# Patient Record
Sex: Female | Born: 1980 | Race: White | Hispanic: No | Smoking: Former smoker
Health system: Southern US, Community
[De-identification: ages and names within clinical notes are randomized; demographics above are authoritative.]

## PROBLEM LIST (undated history)

## (undated) DIAGNOSIS — H9190 Unspecified hearing loss, unspecified ear: Secondary | ICD-10-CM

## (undated) DIAGNOSIS — G40909 Epilepsy, unspecified, not intractable, without status epilepticus: Secondary | ICD-10-CM

## (undated) DIAGNOSIS — T8859XA Other complications of anesthesia, initial encounter: Secondary | ICD-10-CM

## (undated) DIAGNOSIS — K219 Gastro-esophageal reflux disease without esophagitis: Secondary | ICD-10-CM

## (undated) DIAGNOSIS — R569 Unspecified convulsions: Secondary | ICD-10-CM

## (undated) DIAGNOSIS — Z9889 Other specified postprocedural states: Secondary | ICD-10-CM

## (undated) HISTORY — PX: COCHLEAR IMPLANT: SUR684

## (undated) HISTORY — PX: TONSILECTOMY, ADENOIDECTOMY, BILATERAL MYRINGOTOMY AND TUBES: SHX2538

## (undated) HISTORY — DX: Unspecified hearing loss, unspecified ear: H91.90

## (undated) HISTORY — PX: CHOLECYSTECTOMY: SHX55

## (undated) HISTORY — DX: Unspecified convulsions: R56.9

---

## 2009-08-05 ENCOUNTER — Ambulatory Visit (HOSPITAL_COMMUNITY): Admission: RE | Admit: 2009-08-05 | Discharge: 2009-08-05 | Payer: Self-pay | Admitting: Obstetrics & Gynecology

## 2009-09-27 HISTORY — PX: ABDOMINAL HYSTERECTOMY: SHX81

## 2010-04-03 ENCOUNTER — Ambulatory Visit (HOSPITAL_COMMUNITY): Admission: RE | Admit: 2010-04-03 | Discharge: 2010-04-03 | Payer: Self-pay | Admitting: Obstetrics & Gynecology

## 2010-12-13 LAB — CBC
HCT: 45.7 % (ref 36.0–46.0)
Hemoglobin: 15.6 g/dL — ABNORMAL HIGH (ref 12.0–15.0)
MCH: 29.7 pg (ref 26.0–34.0)
MCHC: 34.1 g/dL (ref 30.0–36.0)

## 2013-06-18 ENCOUNTER — Telehealth: Payer: Self-pay | Admitting: Diagnostic Neuroimaging

## 2013-06-19 ENCOUNTER — Telehealth: Payer: Self-pay | Admitting: *Deleted

## 2013-06-19 NOTE — Telephone Encounter (Signed)
After consulting Dr. Marjory Lies, he stated that even with low keppra level, if pt not having seizures or side effects would not change anything on her medical regimen.  Keppra level is not one that we usually draw a level on.  It is one of the medications for seizures that have low drug interactions and side effects.  Father relayed this information.   Will contact daughter and let her know.  If headaches persist, she can be referred here to [place] by her pcp.

## 2013-06-19 NOTE — Telephone Encounter (Signed)
Father calling re: getting response from [place] about her low level of keppra, done by her pcp, [facility].  Looking for lab results, is in Dr. Richrd Humbles office.  Pt has had no seizures states her dad.   She is having headaches and that is why she was seeing her pcp.  Do we want to do anything different for pt?  Please advise.   Pt is deaf, and mobile text only.  I would relay to father.

## 2013-06-20 NOTE — Telephone Encounter (Signed)
Will forward to Dr. Marjory Lies for review.

## 2013-06-21 NOTE — Telephone Encounter (Signed)
Continue current medications. No change recommended. Discussed with Alverda Skeans.  -VRP

## 2013-07-31 ENCOUNTER — Encounter (INDEPENDENT_AMBULATORY_CARE_PROVIDER_SITE_OTHER): Payer: Self-pay

## 2013-07-31 ENCOUNTER — Ambulatory Visit (INDEPENDENT_AMBULATORY_CARE_PROVIDER_SITE_OTHER): Payer: Medicaid Other | Admitting: Diagnostic Neuroimaging

## 2013-07-31 ENCOUNTER — Encounter: Payer: Self-pay | Admitting: Diagnostic Neuroimaging

## 2013-07-31 VITALS — BP 126/87 | HR 86 | Ht 67.0 in | Wt 302.0 lb

## 2013-07-31 DIAGNOSIS — G40909 Epilepsy, unspecified, not intractable, without status epilepticus: Secondary | ICD-10-CM

## 2013-07-31 MED ORDER — LEVETIRACETAM 1000 MG PO TABS: 1000.0000 mg | ORAL_TABLET | Freq: Two times a day (BID) | ORAL | Status: DC

## 2013-07-31 NOTE — Progress Notes (Signed)
[facility]  PATIENT: Mercedes Parks DOB: [birth year]  REFERRING CLINICIAN:  HISTORY FROM: patient and sign interpreter; son (16yrs old) REASON FOR VISIT: follow up   HISTORICAL  CHIEF COMPLAINT:  Chief Complaint  Patient presents with  . Follow-up    Seizure    HISTORY OF PRESENT ILLNESS:   UPDATE 07/31/13: On 07/27/13 patient was at a Halloween party, had several alcoholic drinks, was exposed to secondhand marijuana smoke, and had multiple back-to-back seizures. Apparently she had several minutes of convulsions followed by several minutes of remission. Seizures started and stopped multiple times. Ambulance was called and arrived to pick her up within 45 minutes of onset of symptoms. The symptoms continued while patient was at the hospital. She was given benzodiazepine in the anal his. She was given 1000 mg Keppra IV at the hospital. She was continued on the same dose of antiseizure medication (levetiracetam 500 mg twice a day).  Patient tells that she has been missing some doses of antiseizure medication. She misses 4-8 tablets per month. This usually happens when she falls asleep after school and misses her dose.  UPDATE 11/20/12: She is doing very well, there was no recurrent seizure, she is tolerating Keppra 500 mg twice a day.  UPDATE 02/02/12: No sz. More balance troubles, weight gain, decr energy, and fatigue. Drinks lots of "energy drinks". Having trouble with stairs (trips and falls).   UPDATE 07/06/11: Doing well.  No sz.  Back to driving.  Now with endometriosis and planning to start lupron shots, but concerned about warning on package about epilepsy.  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  He is accompanied by her mother, father and sign language interpreter.  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  Age 76 yrs - cochlear  implants  Oct 2011 - dx'd with endometriosis; treated with tramadol; then had an episode of lightheadedness, out of body sensation, then passed out; reportedly had convulsions and tongue biting, no incontinence; then slept for 2 hours.  Feb 2012 - exposure to synthetic marijuana, then had similar episode of seizure  March 2012 - several episodes of night time awakening, "weird breathing", not knowing if she is awake or dreaming; EEG done at Dr. 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  April 2012 - started on LEV 500mg  BID  REVIEW OF SYSTEMS: Full 14 system review of systems performed and notable only for confusion headache dizziness seizure passing out. Depression decreased energy hallucinations.  ALLERGIES: Allergies  Allergen Reactions  . Amoxicillin   . Penicillins   . Sulfa Antibiotics     HOME MEDICATIONS: Outpatient Prescriptions Prior to Visit  Medication Sig Dispense Refill  . citalopram (CELEXA) 20 MG tablet Take 20 mg by mouth daily.      . clonazePAM (KLONOPIN) 0.5 MG tablet Take 0.5 mg by mouth daily.      Marland Kitchen letrozole (FEMARA) 2.5 MG tablet Take 2.5 mg by mouth daily.      Marland Kitchen levETIRAcetam (KEPPRA) 500 MG tablet Take 500 mg by mouth 2 (two) times daily.      . Norethindrone (CAMILA PO) Take 1 tablet by mouth daily.       No facility-administered medications prior to visit.    PAST MEDICAL HISTORY: Past Medical History  Diagnosis Date  . 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 Seizure  PAST SURGICAL HISTORY: Past Surgical History  Procedure Laterality Date  . Cochlear implant      @ age 14  . Cholecystectomy    . Cesarean section      @ age 39    FAMILY HISTORY: History reviewed. No pertinent family history.  SOCIAL HISTORY:  History   Social History  . Marital Status: Single    Spouse Name: N/A    Number of Children: 1  . Years of Education: College   Occupational History  .      n/a-unemployed    Social History Main Topics  . Smoking status: Former Smoker    Types: Cigarettes    Quit date: 09/27/2012  . Smokeless tobacco: Never Used  . Alcohol Use: No     Comment: quit 11/2010  . Drug Use: No     Comment: marijuana; quit 3/ 2012  . Sexual Activity: Not on file   Other Topics Concern  . Not on file   Social History Narrative   Patient lives at home with her son.   Caffeine Use     PHYSICAL EXAM  Filed Vitals:   07/31/13 1401  BP: 126/87  Pulse: 86  Height: 5\' 7"  (1.702 m)  Weight: 302 lb (136.986 kg)    Not recorded    Body mass index is 47.29 kg/(m^2).  GENERAL EXAM: Patient is in no distress  CARDIOVASCULAR: Regular rate and rhythm, no murmurs, no carotid bruits  NEUROLOGIC: MENTAL STATUS: awake, alert, language fluent, comprehension intact, naming intact CRANIAL NERVE: pupils equal and reactive to light, visual fields full to confrontation, extraocular muscles intact, no nystagmus, facial sensation and strength symmetric, uvula midline, shoulder shrug symmetric, tongue midline. DEAF. USES SIGN LANGUAGE. MOTOR: normal bulk and tone, full strength in the BUE, BLE SENSORY: normal and symmetric to light touch COORDINATION: finger-nose-finger, fine finger movements normal REFLEXES: deep tendon reflexes present and symmetric GAIT/STATION: narrow based gait   DIAGNOSTIC DATA (LABS, IMAGING, TESTING) - I reviewed patient records, labs, notes, testing and imaging myself where available.  Lab Results  Component Value Date   WBC 6.2 03/27/2010   HGB 15.6* 03/27/2010   HCT 45.7 03/27/2010   MCV 87.1 03/27/2010   PLT 229 03/27/2010   No results found for this basename: na, k, cl, co2, glucose, bun, creatinine, calcium, prot, albumin, ast, alt, alkphos, bilitot, gfrnonaa, gfraa   No results found for this basename: CHOL, HDL, LDLCALC, LDLDIRECT, TRIG, CHOLHDL   No results found for this basename: HGBA1C   No results found for this basename: VITAMINB12   No  results found for this basename: TSH      ASSESSMENT AND PLAN  32 y.o.  female with multiple episodes of loss of consciousness, convulsions, tongue biting, also with out of body experience and aura prior to the spells.  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  PLAN: 1. Increase LEV to 1000mg  BID 2. seizure precautions reviewed 3. no driving until sz free x 6 months (last sz 07/27/13)  Return in about 6 months (around 01/28/2014) for with Heide Guile or Penumalli.    Suanne Marker, MD 07/31/2013, 2:58 PM Certified in Neurology, Neurophysiology and Neuroimaging  [facility] [address] [place], [place] [address] [phone]

## 2013-07-31 NOTE — Patient Instructions (Addendum)
Increase levetiracetam to 1000mg  twice a day.  No driving until seizure free x 6 months.

## 2013-10-08 ENCOUNTER — Encounter: Payer: Self-pay | Admitting: Nurse Practitioner

## 2014-01-28 ENCOUNTER — Ambulatory Visit: Payer: Medicaid Other | Admitting: Nurse Practitioner

## 2014-01-30 ENCOUNTER — Encounter: Payer: Self-pay | Admitting: Nurse Practitioner

## 2014-01-30 ENCOUNTER — Ambulatory Visit (INDEPENDENT_AMBULATORY_CARE_PROVIDER_SITE_OTHER): Payer: Medicaid Other | Admitting: Nurse Practitioner

## 2014-01-30 VITALS — BP 110/76 | HR 67 | Ht 67.0 in | Wt 276.0 lb

## 2014-01-30 DIAGNOSIS — G40909 Epilepsy, unspecified, not intractable, without status epilepticus: Secondary | ICD-10-CM | POA: Insufficient documentation

## 2014-01-30 NOTE — Progress Notes (Signed)
PATIENT: Mercedes Parks DOB: 07-05-1981  REASON FOR VISIT: routine follow up for seizure disorder HISTORY FROM: patient  HISTORY OF PRESENT ILLNESS: UPDATE 01/30/14 (LL):  Mercedes Parks comes in for routine follow up, no seizures since last visit.  She had total hysterectomy in December due to endometriosis.  Tolerating LVT 1000 mg BID with only occasional dizziness.  She reports trouble staying asleep, wakes usually 2-3 times. Doing very well.  Asks about "vaping" and if it may trigger seizure.  UPDATE 07/31/13: On 07/27/13 patient was at a [place] party, had several alcoholic drinks, was exposed to secondhand marijuana smoke, and had multiple back-to-back seizures. Apparently she had several minutes of convulsions followed by several minutes of remission. Seizures started and stopped multiple times. Ambulance was called and arrived to pick her up within 45 minutes of onset of symptoms. The symptoms continued while patient was at the hospital. She was given benzodiazepine in the ambulance. She was given 1000 mg Keppra IV at the hospital. She was continued on the same dose of antiseizure medication (levetiracetam 500 mg twice a day).  Patient tells that she has been missing some doses of antiseizure medication. She misses 4-8 tablets per month. This usually happens when she falls asleep after school and misses her dose.  UPDATE 11/20/12: She is doing very well, there was no recurrent seizure, she is tolerating Keppra 500 mg twice a day.  UPDATE 02/02/12: No sz. More balance troubles, weight gain, decr energy, and fatigue. Drinks lots of "energy drinks". Having trouble with stairs (trips and falls).  UPDATE 07/06/11: Doing well. No sz. Back to driving. Now with endometriosis and planning to start lupron shots, but concerned about warning on package about epilepsy.  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 He is  accompanied by her mother, father and sign language interpreter.  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  Age 23 yrs - cochlear implants  Oct 2011 - dx'd with endometriosis; treated with tramadol; then had an episode of lightheadedness, out of body sensation, then passed out; reportedly had convulsions and tongue biting, no incontinence; then slept for 2 hours.  Feb 2012 - exposure to synthetic marijuana, then had similar episode of seizure  March 2012 - several episodes of night time awakening, "weird breathing", not knowing if she is awake or dreaming; EEG done at Dr. 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  April 2012 - started on LEV 500mg  BID   REVIEW OF SYSTEMS: Full 14 system review of systems performed and notable only for  Dizziness, light sensitivity, insomnia.  ALLERGIES: Allergies  Allergen Reactions  . Amoxicillin   . Penicillins   . Sulfa Antibiotics     HOME MEDICATIONS: Outpatient Prescriptions Prior to Visit  Medication Sig Dispense Refill  . levETIRAcetam (KEPPRA) 1000 MG tablet Take 1 tablet (1,000 mg total) by mouth 2 (two) times daily.  180 tablet  4  . levonorgestrel (MIRENA) 20 MCG/24HR IUD 1 each by Intrauterine route once.      . naproxen (NAPROSYN) 250 MG tablet Take 250 mg by mouth 2 (two) times daily with a meal.       No facility-administered medications prior to visit.     PHYSICAL EXAM  Filed Vitals:   01/30/14 1026  BP: 110/76  Pulse: 67  Height: 5\' 7"  (1.702 m)  Weight: 276 lb (125.193 kg)   Body  mass index is 43.22 kg/(m^2).  Generalized: Well developed, in no acute distress  Head: normocephalic and atraumatic. Oropharynx benign  Neck: Supple, no carotid bruits  Cardiac: Regular rate rhythm, no murmur  Musculoskeletal: No deformity   Neurological examination  MENTAL STATUS: awake, alert, language fluent, comprehension intact, naming intact  CRANIAL NERVE: pupils equal  and reactive to light, visual fields full to confrontation, extraocular muscles intact, no nystagmus, facial sensation and strength symmetric, uvula midline, shoulder shrug symmetric, tongue midline. DEAF. USES SIGN LANGUAGE.  MOTOR: normal bulk and tone, full strength in the BUE, BLE  SENSORY: normal and symmetric to light touch  COORDINATION: finger-nose-finger, fine finger movements normal  REFLEXES: deep tendon reflexes present and symmetric  GAIT/STATION: narrow based gait  ASSESSMENT AND PLAN 33 y.o. female with multiple episodes of loss of consciousness, convulsions, tongue biting, also with out of body experience and aura prior to the spells. 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 Last seizure 07/27/13.  PLAN:  1. Continue LEV 1000mg  BID  2. seizure precautions reviewed, sleep hygeine discussed. 3. Recommended against "vaping," no studies done on this with seizure disorder, but not a healthy practice. Avoid. 4. Return in about 6 months, sooner as needed. Discussed MyChart.  Philmore Pali, MSN, NP-C 01/30/2014, 10:58 AM [facility] [address], [place], [place] [address] [phone]  Note: This document was prepared with digital dictation and possible smart phrase technology. Any transcriptional errors that result from this process are unintentional.

## 2014-01-30 NOTE — Patient Instructions (Signed)
PLAN:  1. Continue LEV 1000mg  BID, refills are at the pharmacy. 2. seizure precautions reviewed  3. Return in about 6 months.

## 2014-02-13 ENCOUNTER — Ambulatory Visit (INDEPENDENT_AMBULATORY_CARE_PROVIDER_SITE_OTHER): Payer: Medicaid Other

## 2014-02-13 ENCOUNTER — Ambulatory Visit (INDEPENDENT_AMBULATORY_CARE_PROVIDER_SITE_OTHER): Payer: Medicaid Other | Admitting: Podiatrist

## 2014-02-13 ENCOUNTER — Encounter: Payer: Self-pay | Admitting: Podiatrist

## 2014-02-13 VITALS — BP 122/69 | HR 89 | Resp 17 | Ht 67.0 in | Wt 262.0 lb

## 2014-02-13 DIAGNOSIS — M8430XA Stress fracture, unspecified site, initial encounter for fracture: Secondary | ICD-10-CM

## 2014-02-13 DIAGNOSIS — S93609A Unspecified sprain of unspecified foot, initial encounter: Secondary | ICD-10-CM

## 2014-02-13 NOTE — Progress Notes (Signed)
   Subjective:    Patient ID: Mercedes Parks, female    DOB: 30-May-1981, 33 y.o.   MRN: [id number]  HPI Comments: N foot pain L right lateral dorsal foot D 4 weeks ago O sudden, woke with swelling C swelling, throbbing on and off A weightbearing T ice and rest  Foot Pain      Review of Systems  All other systems reviewed and are negative.      Objective:   Physical Exam Patient is awake, alert, and oriented x 3.  In no acute distress.  Vascular status is intact with palpable pedal pulses at 2/4 DP and PT bilateral and capillary refill time within normal limits. Neurological sensation is also intact bilaterally via Semmes Weinstein monofilament at 5/5 sites. Light touch, vibratory sensation, Achilles tendon reflex is intact. Dermatological exam reveals skin color, turger and texture as normal. No open lesions present.  Musculature intact with dorsiflexion, plantarflexion, inversion, eversion.  Pain along the dorsal lateral side of the right foot is present at the area of the 4th and 5th metatarsal and cuboid articulation.  Discomfort with direct pressure is noted.  xrays concerning for a stress fracture at the 4th metatarsal base.     Assessment & Plan:  Foot sprain vs stress fracture  Plan:  Recommended for [name] to go back into her air fracture walker that she had after her surgery.  She will wear this consistently to keep the weight off her foot.  She will ice and compress the foot and she will be seen back in 3 weeks for re-xray and follow up

## 2014-02-13 NOTE — Patient Instructions (Signed)
Stress Fracture When too much stress is put on the foot, as may occur in running and jumping sports, the lengthy shafts of the bones of the forefoot become susceptible to breaking due to repetitive stress (stress fracture) because of thinness of these bone. A stress fracture is more common if osteoporosis is present or if inadequate athletic footwear is used. Shoes should be used which adequately support the sole of the foot to absorb the shocks of the activity participated in. Stress fractures are very common in competitive female runners who develop these small cracks on the surface of the bones in their legs and feet. The women most likely to suffer these injuries are those who restrict food intake and those who have irregular periods. Stress fractures usually start out as a minor discomfort in the foot or leg. The completion of fracture due to repetitive loading often occurs near the end of a long run. The pain may dissipate with rest. With the next exercise session, the pain may return earlier in the run. If an athlete notices that it hurts to touch just one spot on a bone and then stops running for a week, he or she may be tempted to return to running too soon. Often the pain is ignored in order to continue with high impact exercise. A stress fracture then develops. The athlete now has to avoid the hard pounding of running, but can ride a bike or swim for exercise once the pain has resolved with normal weight bearing until the fracture heals in 6 12 weeks. The most common sites for stress fractures are the bones in the front of the feet (metatarsals) and the long bone of the lower leg (tibia), but running can cause stress fractures anywhere in the lower extremities or pelvis. DIAGNOSIS  Usually the diagnosis is made by reviewing the patient's history. The bone involved progressively becomes more painful with activities. X-rays may show no break within the first 2 3 weeks that pain begins. A later X-ray may  show signs that the bone is healing. Having a bone scan or MRI usually makes an earlier diagnosis possible. HOME CARE INSTRUCTIONS  Treatment may include a cast or walking shoe.  High impact activities should be stopped until advised by your caregiver.  Wear shoes with adequate shock absorbing abilities and good support of the sole of the foot. This is especially important in the arch of the foot.  Alternative exercise may be undertaken while waiting for healing. This may include bicycling and swimming. If you do not have a cast or splint:  You may walk on your injured foot as tolerated or advised.  Do not put any weight on your injured foot until instructed. Slowly increase the amount of time you walk on the foot as the pain allows or as advised.  Use crutches until you can bear weight without pain. A gradual increase in weight bearing may help.  Apply ice to the injured area for the first 2 days after you have been treated or as directed by your caregiver.  Put ice in a plastic bag.  Place a towel between your skin and the bag.  Leave the ice on for 15 20 minutes at a time, every hour while you are awake.  Only take over-the-counter or prescription medicines for pain or discomfort as directed by your caregiver.  If your caregiver has given you a follow-up appointment, it is very important to keep that appointment. Not keeping the appointment could result in a  chronic or permanent injury, pain, and disability. SEEK IMMEDIATE MEDICAL CARE IF:   Pain is becoming worse rather than better.  Pain is uncontrolled with medicine.  You have increased swelling or redness in the foot.  The feeling in the foot or leg is diminished. MAKE SURE YOU:   Understand these instructions.  Will watch your condition.  Will get help right away if you are not doing well or get worse. Document Released: 12/04/2002 Document Revised: 01/08/2013 Document Reviewed: 04/29/2008 [facility] Patient  Information 2014 [place].

## 2014-03-06 ENCOUNTER — Ambulatory Visit (INDEPENDENT_AMBULATORY_CARE_PROVIDER_SITE_OTHER): Payer: Medicaid Other

## 2014-03-06 ENCOUNTER — Encounter: Payer: Self-pay | Admitting: Podiatrist

## 2014-03-06 ENCOUNTER — Ambulatory Visit (INDEPENDENT_AMBULATORY_CARE_PROVIDER_SITE_OTHER): Payer: Medicaid Other | Admitting: Podiatrist

## 2014-03-06 VITALS — BP 103/64 | HR 76 | Resp 12

## 2014-03-06 DIAGNOSIS — M8430XA Stress fracture, unspecified site, initial encounter for fracture: Secondary | ICD-10-CM

## 2014-03-06 DIAGNOSIS — S93609A Unspecified sprain of unspecified foot, initial encounter: Secondary | ICD-10-CM

## 2014-03-06 NOTE — Patient Instructions (Signed)
We will order a CT scan through [facility] in [place]-- we will email you the date and time of the study and find out about setting up interpreter services for you there as well.  We will email you the result of the study as well and treatment recommendations. For now stay in your boot!

## 2014-03-07 ENCOUNTER — Telehealth: Payer: Self-pay | Admitting: *Deleted

## 2014-03-07 NOTE — Progress Notes (Addendum)
Subjective: Mercedes Parks presents today with her interpreter for continued pain lateral aspect of the right foot. She states that the foot hurts off and on regardless of if she is wearing the boot or not. She relates pain at the fifth metatarsal base right foot.  Objective: Neurovascular status is unchanged. Palpation of the fifth metatarsal base and cuboid region reveals pain with pressure. Questionable stress fracture is present versus lateral impingement syndrome.  Assessment: Questionable stress fracture right foot is impingement syndrome lateral aspect right foot  Plan: Because she has had no relief with boot therapy I recommended an MR I do determine the cause of her pain. She states she cannot get an MRI due to metal implant problem.  In this case a CT scan will be ordered instead.  We will set this up for her at [facility].  I will notify her via e-mail the results of the test. In the meantime she is to stay in her boot. Of note she was wearing a pair of very thin flip-flops to today's office visit. I cautioned her against wearing any type of footwear that is unsupportive.

## 2014-03-07 NOTE — Telephone Encounter (Signed)
I sent the patient an email informing her of her appointment for a CT Scan at [facility] on 03/12/2014 at 2:40pm.  She is to arrive at 2:10pm.  Cannot have anything to eat or drink 4 hours before appointment.

## 2014-03-08 NOTE — Progress Notes (Signed)
I reviewed note and agree with plan.   Jaxiel Kines R. Kiet Geer, MD  Certified in Neurology, Neurophysiology and Neuroimaging  [facility] [address], [address] [place], [place] [address] [phone]   

## 2014-03-11 ENCOUNTER — Telehealth: Payer: Self-pay | Admitting: *Deleted

## 2014-03-11 DIAGNOSIS — M8430XA Stress fracture, unspecified site, initial encounter for fracture: Secondary | ICD-10-CM

## 2014-03-11 NOTE — Telephone Encounter (Signed)
Called and answered all pertinent questions to get authorization for MRI.  Procedure was authorized from 03/11/2014 until 04/10/2014.  Authorization number is [id number] .  Case number is [id number].

## 2014-03-11 NOTE — Telephone Encounter (Signed)
We need authorization for her MRI from Medicaid.  I told her I would call her back with the authorization number

## 2014-03-11 NOTE — Telephone Encounter (Signed)
Message copied by Lolita Rieger on Mon Mar 11, 2014 11:57 AM ------      Message from: Bronson Ing      Created: Wed Mar 06, 2014  2:01 PM      Regarding: ct scan for right foot       D.            Can you set up a CT scan with no contrast for her right foot-- ? 5th metatarsal or cuboid stress fracture?            She lives in [place] and would like to have it done at [facility]            She is hearing impaired as well and will need an interpreter.            She also has problems with phone calls and does better with email.            If you can email her all this info-- date and time of appointment, what to bring, etc that would be great.            Its            [email or web address]            (her son's name)            Thanks!!  E ------

## 2014-03-12 ENCOUNTER — Telehealth: Payer: Self-pay | Admitting: *Deleted

## 2014-03-12 NOTE — Telephone Encounter (Signed)
I called and scheduled a CT Scan and informed them patient is hearing impaired.  She will need an interpreter.  Appointment was scheduled for 03/13/2014 at 3:45pm at [address].  I sent the patient an e-mail about the change.

## 2014-03-12 NOTE — Telephone Encounter (Signed)
I called to get authorization for the MRI changed to Ct and change the location to [place] on [address].Mercedes Parks stated that the changes had been made, CT has been approved the approval number will be the same,  [id number].

## 2014-03-12 NOTE — Telephone Encounter (Signed)
She called to see if we got authorization for the CT scan.  I told her no, we're going to cancel the CT.  She asked why.  I informed her due to interpreter not being covered by [facility].  She stated okay thank you.

## 2014-03-13 ENCOUNTER — Ambulatory Visit
Admission: RE | Admit: 2014-03-13 | Discharge: 2014-03-13 | Disposition: A | Discharge status: Home / Self Care | Payer: Medicaid Other | Source: Ambulatory Visit | Attending: Podiatrist | Admitting: Podiatrist

## 2014-03-13 DIAGNOSIS — M8430XA Stress fracture, unspecified site, initial encounter for fracture: Secondary | ICD-10-CM

## 2014-03-22 ENCOUNTER — Ambulatory Visit (INDEPENDENT_AMBULATORY_CARE_PROVIDER_SITE_OTHER): Payer: Medicaid Other | Admitting: Podiatrist

## 2014-03-22 ENCOUNTER — Encounter: Payer: Self-pay | Admitting: Podiatrist

## 2014-03-22 VITALS — BP 125/60 | HR 88 | Resp 15 | Ht 67.0 in | Wt 264.0 lb

## 2014-03-22 DIAGNOSIS — S93609A Unspecified sprain of unspecified foot, initial encounter: Secondary | ICD-10-CM

## 2014-03-22 NOTE — Patient Instructions (Signed)
Foot Sprain The muscles and cord like structures which attach muscle to bone (tendons) that surround the feet are made up of units. A foot sprain can occur at the weakest spot in any of these units. This condition is most often caused by injury to or overuse of the foot, as from playing contact sports, or aggravating a previous injury, or from poor conditioning, or obesity. SYMPTOMS  Pain with movement of the foot.  Tenderness and swelling at the injury site.  Loss of strength is present in moderate or severe sprains. THE THREE GRADES OR SEVERITY OF FOOT SPRAIN ARE:  Mild (Grade I): Slightly pulled muscle without tearing of muscle or tendon fibers or loss of strength.  Moderate (Grade II): Tearing of fibers in a muscle, tendon, or at the attachment to bone, with small decrease in strength.  Severe (Grade III): Rupture of the muscle-tendon-bone attachment, with separation of fibers. Severe sprain requires surgical repair. Often repeating (chronic) sprains are caused by overuse. Sudden (acute) sprains are caused by direct injury or over-use. DIAGNOSIS  Diagnosis of this condition is usually by your own observation. If problems continue, a caregiver may be required for further evaluation and treatment. X-rays may be required to make sure there are not breaks in the bones (fractures) present. Continued problems may require physical therapy for treatment. PREVENTION  Use strength and conditioning exercises appropriate for your sport.  Warm up properly prior to working out.  Use athletic shoes that are made for the sport you are participating in.  Allow adequate time for healing. Early return to activities makes repeat injury more likely, and can lead to an unstable arthritic foot that can result in prolonged disability. Mild sprains generally heal in 3 to 10 days, with moderate and severe sprains taking 2 to 10 weeks. Your caregiver can help you determine the proper time required for  healing. HOME CARE INSTRUCTIONS   Apply ice to the injury for 15-20 minutes, 03-04 times per day. Put the ice in a plastic bag and place a towel between the bag of ice and your skin.  An elastic wrap (like an Ace bandage) may be used to keep swelling down.  Keep foot above the level of the heart, or at least raised on a footstool, when swelling and pain are present.  Try to avoid use other than gentle range of motion while the foot is painful. Do not resume use until instructed by your caregiver. Then begin use gradually, not increasing use to the point of pain. If pain does develop, decrease use and continue the above measures, gradually increasing activities that do not cause discomfort, until you gradually achieve normal use.  Use crutches if and as instructed, and for the length of time instructed.  Keep injured foot and ankle wrapped between treatments.  Massage foot and ankle for comfort and to keep swelling down. Massage from the toes up towards the knee.  Only take over-the-counter or prescription medicines for pain, discomfort, or fever as directed by your caregiver. SEEK IMMEDIATE MEDICAL CARE IF:   Your pain and swelling increase, or pain is not controlled with medications.  You have loss of feeling in your foot or your foot turns cold or blue.  You develop new, unexplained symptoms, or an increase of the symptoms that brought you to your caregiver. MAKE SURE YOU:   Understand these instructions.  Will watch your condition.  Will get help right away if you are not doing well or get worse. Document Released:   03/05/2002 Document Revised: 12/06/2011 Document Reviewed: 05/02/2008 Exit[employer] Patient Information 2015 [employer]. This information is not intended to replace advice given to you by your health care provider. Make sure you discuss any questions you have with your health care provider.  

## 2014-03-25 NOTE — Progress Notes (Signed)
Mercedes Parks presents today for follow up of CT scan of the right foot.  She relates she has been wearing her boot and the foot is slowly starting to improve.  She states she wears the boot most of the time however today she presents in flip flops  Objective:  Mild pain along the lateral right foot.  It has improved significantly from the past visit.  No pain in the plantar fascia region of the right foot present.    Assessment:  Lateral foot pain, compensation, sprain  Plan:  Recommended continuation in her boot for 2-3 more weeks.  She will then slowly wean out into supportive sneakers.  CT scan result was essentially negative and this was discussed as well.  She will call if symptoms do not resolve.

## 2014-08-05 ENCOUNTER — Ambulatory Visit (INDEPENDENT_AMBULATORY_CARE_PROVIDER_SITE_OTHER): Payer: Medicaid Other | Admitting: Nurse Practitioner

## 2014-08-05 ENCOUNTER — Encounter: Payer: Self-pay | Admitting: Nurse Practitioner

## 2014-08-05 VITALS — BP 113/80 | HR 59 | Ht 67.0 in | Wt 268.0 lb

## 2014-08-05 DIAGNOSIS — G40909 Epilepsy, unspecified, not intractable, without status epilepticus: Secondary | ICD-10-CM

## 2014-08-05 DIAGNOSIS — H532 Diplopia: Secondary | ICD-10-CM

## 2014-08-05 MED ORDER — LEVETIRACETAM 1000 MG PO TABS: 1000.0000 mg | ORAL_TABLET | Freq: Two times a day (BID) | ORAL | Status: DC

## 2014-08-05 NOTE — Progress Notes (Addendum)
PATIENT: Mercedes Parks DOB: 17-Apr-1981  REASON FOR VISIT: routine follow up for seizures HISTORY FROM: patient, with interpreter  HISTORY OF PRESENT ILLNESS: UPDATE 08/05/14 (LL): Since last visit, patient has been well, no seizures. She is assisted by sign-language interpreter today. Tolerating Levitiracetam 1000 mg BID well.  Has been having some increased blurred vision and occasional diplopia, recent eye exam was normal with the exception of a change on her eyeglass Rx. She is worried about memory loss, having trouble with remembering appointments and things that she and her son have scheduled. Has lost 40 lb. In the last year.   UPDATE 01/30/14 (LL):  Mercedes Parks comes in for routine follow up, no seizures since last visit.  She had total hysterectomy in December due to endometriosis.  Tolerating Levitiracetam 1000 mg BID with only occasional dizziness.  She reports trouble staying asleep, wakes usually 2-3 times. Doing very well.  Asks about "vaping" and if it may trigger seizure.  UPDATE 07/31/13: On 07/27/13 patient was at a [employer] party, had several alcoholic drinks, was exposed to secondhand marijuana smoke, and had multiple back-to-back seizures. Apparently she had several minutes of convulsions followed by several minutes of remission. Seizures started and stopped multiple times. Ambulance was called and arrived to pick her up within 45 minutes of onset of symptoms. The symptoms continued while patient was at the hospital. She was given benzodiazepine in the ambulance. She was given 1000 mg Keppra IV at the hospital. She was continued on the same dose of antiseizure medication (levetiracetam 500 mg twice a day).   Patient tells that she has been missing some doses of antiseizure medication. She misses 4-8 tablets per month. This usually happens when she falls asleep after school and misses her dose.  UPDATE 11/20/12: She is doing very well, there was no recurrent seizure, she is  tolerating Keppra 500 mg twice a day.   UPDATE 02/02/12: No sz. More balance troubles, weight gain, decr energy, and fatigue. Drinks lots of "energy drinks". Having trouble with stairs (trips and falls).  UPDATE 07/06/11: Doing well. No sz. Back to driving. Now with endometriosis and planning to start lupron shots, but concerned about warning on package about epilepsy.   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 He is accompanied by her mother, father and sign language interpreter.   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   Feb 2012 - exposure to synthetic marijuana, then had similar episode of seizure   March 2012 - several episodes of night time awakening, "weird breathing", not knowing if she is awake or dreaming; EEG done at Dr. 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   April 2012 - started on LEV 500mg  BID   REVIEW OF SYSTEMS: Full 14 system review of systems performed and notable only for memory loss, blurred vision, double vision.   ALLERGIES: Allergies  Allergen Reactions  . Amoxicillin   . Penicillins   . Sulfa Antibiotics     HOME MEDICATIONS: Outpatient Prescriptions Prior to Visit  Medication Sig Dispense Refill  . estradiol (ESTRACE) 2 MG tablet Take 2 mg by mouth daily.    Marland Kitchen  levETIRAcetam (KEPPRA) 1000 MG tablet Take 1 tablet (1,000 mg total) by mouth 2 (two) times daily. 180 tablet 4  . naproxen sodium (ANAPROX) 220 MG tablet Take 220 mg by mouth 2 (two) times daily with a meal.     No facility-administered medications prior to visit.    PHYSICAL EXAM Filed  Vitals:   08/05/14 1547  BP: 113/80  Pulse: 59  Height: 5\' 7"  (1.702 m)  Weight: 268 lb (121.564 kg)   Body mass index is 41.96 kg/(m^2).  Generalized: Well developed, in no acute distress   Head: normocephalic and atraumatic. Oropharynx benign   Neck: Supple, no carotid bruits   Cardiac: Regular rate rhythm, no murmur   Musculoskeletal: No deformity   Neurological examination  MENTAL STATUS: awake, alert, language fluent, comprehension intact, naming intact, MMSE 27/30, CDT 4/4, AFT 13. CRANIAL NERVE: pupils equal and reactive to light, visual fields full to confrontation, extraocular muscles intact, mild saccadic breakdown, facial sensation and strength symmetric, uvula midline, shoulder shrug symmetric, tongue midline. DEAF. USES SIGN LANGUAGE.   MOTOR: normal bulk and tone, full strength in the BUE, BLE   SENSORY: normal and symmetric to light touch   COORDINATION: finger-nose-finger, fine finger movements normal   REFLEXES: deep tendon reflexes present and symmetric   GAIT/STATION: narrow based gait, tandem gait unsteady.   ASSESSMENT: 33 y.o. female with multiple episodes of loss of consciousness, convulsions, tongue biting, also with out of body experience and aura prior to the spells. 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 Last seizure 07/27/13. Has been seizure free for over a year, compliant with medication. Subjective memory loss, MMSE 27/30, likely due to busy lifestyle - juggling full-time school and kids, reassured her in this respect. New complaints of blurred vision and occasional diplopia, will check Head CT to be safe, cannot MRI due to cochlear implant.  PLAN:   1. Continue LEV 1000mg  BID   2. seizure precautions reviewed, sleep hygeine discussed. 3. CT Head wo contrast. >>>> No acute findings, 08/09/14. 4. Return in about 6 months with Dr. Leta Baptist, sooner as  needed.   Orders Placed This Encounter  Procedures  . CT Head Wo Contrast   Meds ordered this encounter  Medications  . levETIRAcetam (KEPPRA) 1000 MG tablet    Sig: Take 1 tablet (1,000 mg total) by mouth 2 (two) times daily.    Dispense:  180 tablet    Refill:  4    Order Specific Question:  Supervising Provider    Answer:  Penni Bombard [[id number]]   Return in about 6 months (around 02/03/2015) for seizure disorder.  Rudi Rummage Bona Hubbard, MSN, FNP-BC, A/GNP-C 08/05/2014, 4:34 PM [facility] [address] [place], [place] [address] [phone]  Note: This document was prepared with digital dictation and possible smart phrase technology. Any transcriptional errors that result from this process are unintentional.

## 2014-08-05 NOTE — Patient Instructions (Addendum)
Continue Levitiracetam at current dose.  I want to check a HEAD CT, since you are having blurred and double vision at times.  You may do this at [facility] in [place].  Follow up in 6 months with Dr. Leta Parks,  sooner as needed.   Managing Seizure Triggers: Tips for Lifestyle Modification Adapted from the [place], [facility], [place], [place] and the journal "Clinical Nursing Practice in Epilepsy", Spring 1994.  Developing plans to modify your lifestyle is an important part of seizure preparedness. It's a way that you, as a person with seizures or a parent of a child with seizures, can take charge and play an active role in your epilepsy care. The following tips are examples of what people can do to manage triggers. Some of these tips may require a change in behavior, others may be ways to adjust your environment or schedule so not everything happens at once. Before choosing tips to try, make sure you've assessed your situation and talked to your doctor and other health care professionals for their suggestions too. Please note that research on the effectiveness of many of these techniques is limited. Many of these tips are common sense suggestions or are from health care professionals and people with epilepsy as to what they have seen and tried.  Noises: People who think they are affected by noises should be sure to talk to their doctor about whether they have a form of 'reflex epilepsy' or if general noise or distraction may be a trigger in another way. People with true reflex epilepsy may respond to specific seizure medicines and should talk to their doctor. Try using earplugs or earphones, especially in noisy or crowded places. Try listening to relaxing music or sounds, or try distracting yourself by singing or focusing on another activity.  Bright, flashing or fluorescent lights: Use polarized or tinted glasses. Use natural  lighting when indoors. Focus on distant objects when riding in a car to avoid flickering lights or patterns. Avoid discos, strobe lights or flashing bulbs on holiday decorations. Use computer monitor with minimal contrast glare or use a screen filter. Consult with your doctor about other specific recommendations for computer use.  Sleep: Try to regulate sleeping habits so you have a consistent schedule and get enough sleep. Keep a log or diary of your sleep patterns, seizures and general well-being. Ask a partner or companion to record his or her observations too. Consider the following ideas to improve sleep.  . Discuss your medicine schedule with your doctor or nurse. Changing times or doses at night may help sleep. . Limit caffeine and try to avoid it after noon time or mid?afternoon at the latest. . Avoid alcohol and nicotine prior to sleep. . Limit working or studying late at night. Stop work at least one hour before bedtime to allow time to relax. . Exercise in the early evening if possible. . Take warm showers or have someone give you a back rub before bedtime to decrease muscle tension. . Try relaxation exercises before bedtime. . Limit naps and don't nap in the early evening. . If anxious or worried, talk to someone or write down your feelings before going to sleep. Put this away and deal with these worries or concerns in the morning! . If you can't fall asleep within 15 minutes get up and do something else for 15 minutes. Then go back to bed and try again. Don't toss and turn in bed all night.  Exercise: Regular  exercise is good for everyone. Pace your exercise to avoid getting too tired or hyperventilation. Avoid exercising in the middle of the day during hot weather. Ask your doctor about any specific exercises you may need to avoid.  Hyperventilation: Try relaxation or slow breathing exercises when anxious or if you begin to hyperventilate. Pace your activity and  avoid sports that may trigger hyperventilation.  Diet: Regulate meal times and patterns around sleep, activity, and medication schedules. Usually taking medicines after food or around meals makes it easier to remember them and may lessen any stomach distress from side effects of medicines. Have a well-balanced diet and eat at consistent times to avoid long periods without food. If your appetite is poor, try small frequent meals instead of skipping meals. Avoid foods and drinks that may aggravate seizures. Not everyone is sensitive to foods, but if you are, talk to your doctor about how to modify your diet. If you are following a diet specifically for your epilepsy, be sure to follow the advice of your doctor and nutritionist.  Alcohol/Drugs: Avoid recreational drugs and talk to your doctor about use of alcohol. Avoid alcohol completely if you're going through high-risk times or have recently had surgery. If you choose to drink alcohol, use 'moderation', drink slowly, and have only one or two glasses at a time. Consider carefully what you drink, avoiding 'hard liquor' or mixed drinks that may have high alcohol content. If alcohol and drugs are a problem for you, talk to your doctor and get professional help.  Hormonal changes: Both men and women may notice a cyclical pattern to their seizures. Record seizures on a calendar and track them in relation to any changes in hormones. Women who are having menstrual cycles should track their cycle days. Women who have stopped having their menses should track other symptoms or changes, while women who are pregnant should track their pregnancy too. The use of hormonal medicines, such as contraceptives or birth control pills as well as hormonal replacement therapy, may affect seizures in some women, so record the dates and doses of these medicines.  NOTE: some seizure medicines may interfere with the effectiveness of hormonal contraceptives making unexpected or  unplanned pregnancy more likely. Be sure to talk to your doctor about all contraceptive use.When seizures cluster around menses or hormone changes, women should try to modify their lifestyle so other triggers don't occur during this high-risk time. Some women may use 'as needed' medicines to help treat seizures associated with menses. Note the use of these on your calendars and seizure preparedness plan.  Illness, fever, trauma: Notify your doctor if you become ill, have a fever, injure yourself seriously, or need other medicines such as antibiotics, painkillers, or cold medicines. Some people may notice that certain medicines can trigger seizures or interfere with seizure medicines. Fevers, other illnesses and injuries may also make you more susceptible and you'll need to monitor your seizures carefully. Try to limit other triggers during these times and talk to your doctor about what medicines you can use.  Stress, anxiety, depression: Emotional stress is a common trigger for some people, and stress can be a cause and symptom of mood problems such as anxiety and depression. Track your stress level and mood in relation to your seizures on your diary. During stressful times, consider ways to modify your lifestyle and manage stress better. . Try counseling to help cope with seizures or other problems. . Consider support groups for epilepsy, or groups for stress management, therapy,  and other support. . Write down feelings in a diary on a regular basis. It helps you get feelings out, rather than hold them in, and can help you see the issues more clearly. . Use 'time-out' periods. Just like kids may need a time-out when they are overwhelmed or acting out, so too do adults. Giving yourself a time-out allows you to take a step back from the stressor or situation and think about how best to address it. . Learn relaxation exercises, deep breathing, yoga, or other strategies that help with stress  and general well-being. . Tell your doctor and nurse how you feel. The effects of stress can be harmful to your seizures, and your life. When mood changes last longer than expected, you may need help from a mental health professional too. If you feel emotionally unsafe, call your doctor or go to an emergency room to be evaluated.

## 2014-09-02 NOTE — Progress Notes (Signed)
I reviewed note and agree with plan.   Sharlett Lienemann R. Breylen Agyeman, MD  Certified in Neurology, Neurophysiology and Neuroimaging  [facility] [address], [address] [place], [place] [address] [phone]   

## 2014-09-16 ENCOUNTER — Encounter: Payer: Self-pay | Admitting: Nurse Practitioner

## 2014-10-28 ENCOUNTER — Other Ambulatory Visit: Payer: Self-pay | Admitting: Diagnostic Neuroimaging

## 2015-02-03 ENCOUNTER — Ambulatory Visit (INDEPENDENT_AMBULATORY_CARE_PROVIDER_SITE_OTHER): Payer: Medicaid Other | Admitting: Diagnostic Neuroimaging

## 2015-02-03 ENCOUNTER — Encounter: Payer: Self-pay | Admitting: Diagnostic Neuroimaging

## 2015-02-03 VITALS — BP 100/58 | HR 84 | Ht 67.0 in | Wt 286.1 lb

## 2015-02-03 DIAGNOSIS — G40309 Generalized idiopathic epilepsy and epileptic syndromes, not intractable, without status epilepticus: Secondary | ICD-10-CM | POA: Diagnosis not present

## 2015-02-03 MED ORDER — LEVETIRACETAM 1000 MG PO TABS: 1000.0000 mg | ORAL_TABLET | Freq: Two times a day (BID) | ORAL | Status: DC

## 2015-02-03 NOTE — Progress Notes (Signed)
PATIENT: Mercedes Parks DOB: 1980-10-09  REASON FOR VISIT: routine follow up for seizures HISTORY FROM: patient, with ASL interpreter  Chief Complaint  Patient presents with  . Follow-up    seizure disorder     HISTORY OF PRESENT ILLNESS:  UPDATE 02/03/15 (VRP): Since last visit, doing well. No seizures. Tolerating medications. Some more anxiety, now on cymbalta, and feels better.  UPDATE 08/05/14 (LL): Since last visit, patient has been well, no seizures. She is assisted by sign-language interpreter today. Tolerating Levitiracetam 1000 mg BID well.  Has been having some increased blurred vision and occasional diplopia, recent eye exam was normal with the exception of a change on her eyeglass Rx. She is worried about memory loss, having trouble with remembering appointments and things that she and her son have scheduled. Has lost 40 lb. In the last year.   UPDATE 01/30/14 (LL):  [name] comes in for routine follow up, no seizures since last visit.  She had total hysterectomy in December due to endometriosis.  Tolerating Levitiracetam 1000 mg BID with only occasional dizziness.  She reports trouble staying asleep, wakes usually 2-3 times. Doing very well.  Asks about "vaping" and if it may trigger seizure.  UPDATE 07/31/13: On 07/27/13 patient was at a [place] party, had several alcoholic drinks, was exposed to secondhand marijuana smoke, and had multiple back-to-back seizures. Apparently she had several minutes of convulsions followed by several minutes of remission. Seizures started and stopped multiple times. Ambulance was called and arrived to pick her up within 45 minutes of onset of symptoms. The symptoms continued while patient was at the hospital. She was given benzodiazepine in the ambulance. She was given 1000 mg Keppra IV at the hospital. She was continued on the same dose of antiseizure medication (levetiracetam 500 mg twice a day). Patient tells that she has been missing some  doses of antiseizure medication. She misses 4-8 tablets per month. This usually happens when she falls asleep after school and misses her dose.   UPDATE 11/20/12: She is doing very well, there was no recurrent seizure, she is tolerating Keppra 500 mg twice a day.    UPDATE 02/02/12: No sz. More balance troubles, weight gain, decr energy, and fatigue. Drinks lots of "energy drinks". Having trouble with stairs (trips and falls).   UPDATE 07/06/11: Doing well. No sz. Back to driving. Now with endometriosis and planning to start lupron shots, but concerned about warning on package about epilepsy.    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 He is accompanied by her mother, father and sign language interpreter.  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 Age 66 yrs - cochlear implants. Oct 2011 - dx'd with endometriosis; treated with tramadol; then had an episode of lightheadedness, out of body sensation, then passed out; reportedly had convulsions and tongue biting, no incontinence; then slept for 2 hours. Feb 2012 - exposure to synthetic marijuana, then had similar episode of seizure. March 2012 - several episodes of night time awakening, "weird breathing", not knowing if she is awake or dreaming; EEG done at Dr. 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  April 2012 - started on LEV 500mg  BID    REVIEW OF SYSTEMS: Full 14 system review of systems performed and notable only for depression anxiety.  ALLERGIES: Allergies  Allergen Reactions  . Amoxicillin   . Penicillins   . Sulfa  Antibiotics     HOME MEDICATIONS: Outpatient Prescriptions Prior to Visit  Medication Sig Dispense Refill  . estradiol (ESTRACE) 2 MG tablet Take 2 mg by mouth daily.    Marland Kitchen levETIRAcetam (KEPPRA) 1000 MG tablet Take 1 tablet (1,000 mg total) by mouth 2 (two) times daily. 180  tablet 4  . levETIRAcetam (KEPPRA) 1000 MG tablet TAKE 1 TABLET (1,000 MG TOTAL) BY MOUTH 2 (TWO) TIMES DAILY. 180 tablet 1   No facility-administered medications prior to visit.    PHYSICAL EXAM Filed Vitals:   02/03/15 1011  BP: 100/58  Pulse: 84  Height: 5\' 7"  (1.702 m)  Weight: 286 lb 1.6 oz (129.774 kg)   Wt Readings from Last 3 Encounters:  02/03/15 286 lb 1.6 oz (129.774 kg)  08/05/14 268 lb (121.564 kg)  03/22/14 264 lb (119.75 kg)   Body mass index is 44.8 kg/(m^2).  GENERAL EXAM: Patient is in no distress; well developed, nourished and groomed; neck is supple  CARDIOVASCULAR: Regular rate and rhythm, no murmurs, no carotid bruits  NEUROLOGIC: MENTAL STATUS: awake, alert, language fluent, comprehension intact; DEAF, USES SIGN LANGUAGE CRANIAL NERVE: pupils equal and reactive to light, visual fields full to confrontation, extraocular muscles intact, no nystagmus, facial sensation and strength symmetric, hearing intact, palate elevates symmetrically, uvula midline, shoulder shrug symmetric, tongue midline. MOTOR: normal bulk and tone, full strength in the BUE, BLE SENSORY: normal and symmetric to light touch COORDINATION: finger-nose-finger, fine finger movements normal REFLEXES: deep tendon reflexes present and symmetric GAIT/STATION: narrow based gait     DIAGNOSTICS/IMAGING:  08/09/14 CT head - nothing acute   ASSESSMENT: 34 y.o. with multiple episodes of loss of consciousness, convulsions, tongue biting, also with out of body experience and aura prior to the spells. 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 Last seizure 07/27/13. Has been seizure free since then and compliant with medication.   PLAN:   1. Continue LEV 1000mg  BID    Meds ordered this encounter  Medications  . levETIRAcetam (KEPPRA) 1000 MG tablet    Sig: Take 1 tablet (1,000 mg total) by  mouth 2 (two) times daily.    Dispense:  180 tablet    Refill:  4   Return in about 1 year (around 02/03/2016).  Penni Bombard, MD 4/0/9811, 91:47 AM Certified in Neurology, Neurophysiology and Neuroimaging  [facility] [address], [place], [place] [address] [phone]

## 2015-02-03 NOTE — Patient Instructions (Signed)
Continue current medications. 

## 2015-04-21 ENCOUNTER — Other Ambulatory Visit: Payer: Self-pay | Admitting: Diagnostic Neuroimaging

## 2015-06-23 ENCOUNTER — Encounter: Payer: Self-pay | Admitting: Internal Medicine

## 2015-07-09 ENCOUNTER — Ambulatory Visit: Payer: Medicaid Other | Admitting: Nurse Practitioner

## 2015-07-14 ENCOUNTER — Encounter: Payer: Self-pay | Admitting: Nurse Practitioner

## 2015-07-14 ENCOUNTER — Ambulatory Visit (INDEPENDENT_AMBULATORY_CARE_PROVIDER_SITE_OTHER): Payer: Medicaid Other | Admitting: Nurse Practitioner

## 2015-07-14 VITALS — BP 145/75 | HR 82 | Temp 98.1°F | Ht 67.0 in | Wt 297.6 lb

## 2015-07-14 DIAGNOSIS — K5900 Constipation, unspecified: Secondary | ICD-10-CM | POA: Insufficient documentation

## 2015-07-14 DIAGNOSIS — R1032 Left lower quadrant pain: Secondary | ICD-10-CM

## 2015-07-14 DIAGNOSIS — R109 Unspecified abdominal pain: Secondary | ICD-10-CM | POA: Insufficient documentation

## 2015-07-14 NOTE — Patient Instructions (Signed)
1. Try to increase the amount of watery drink in a day to 64 ounces. 2. Try to increase the amount of fiber foods such as fruits, vegetables, whole grain breads that she read the day. 3. We will start you on Lynn's S1 145 g per day. 4. We will give you samples to last 2-3 weeks.  5. Call us in 2-3 weeks and notify us if your medicine is working. 6. Return for follow-up in 4 weeks.

## 2015-07-14 NOTE — Assessment & Plan Note (Signed)
Asian presents with abdominal pain as noted below. CT shows abundant stool in the splenic flexure and moderate stool in the descending and sigmoid colon. She does have daily bowel movements but has sensation of incomplete emptying, and hard stools which require considerable straining. Has an adequate fiber and water intake. Her abdominal pain is likely explained by her constipation. At this point we will trial her on Lynn's S100 and 40 g once daily, we'll provide her with samples to last a couple weeks. She is instructed to call us in 2-3 weeks with the results including any changes in her abdominal pain as well as changes in her stool habits. Also recommend increase water and dietary fiber intake. Return for follow-up in 4 weeks.

## 2015-07-14 NOTE — Assessment & Plan Note (Signed)
On the left side which is been occurring for approximately 2 months. The pain is persistent but varies in intensity. Her presentation is consistent with constipation as noted above. Recent CT suggests retained stool, no obstruction noted. There is also a note of focal scarring left anterior pelvic wall consistent with mild inflammatory changes of scar or endometrial seeding. We'll refer back to PCP for follow-up on this item. Her abdominal pain is likely due to her constipation. Constipation treatment as noted above. Return for follow-up in 4 weeks.

## 2015-07-14 NOTE — Progress Notes (Signed)
Primary Care Physician:  Denny Levy, [place] Primary Gastroenterologist:  Dr. Gala Romney  Chief Complaint  Patient presents with  . Abdominal Pain    HPI:   34 year old female presents for evaluation of abdominal pain. Of note, patient is deaf and has assisted by an in person interpreter. She is referred by her PCP. PCP notes reviewed and last saw the patient a 20 06/17/2015 for left lower quadrant pain for 2 weeks seen by gynecology and no further gynecological recommendations. Of note she has no ovaries, no gallbladder. At that point she had denied fever, change in bowel habits and is status post hysterectomy in 2014 due to endometriosis. Pain was located left lower quadrant and radiating to the inguinal area which is reproducible on physical exam. Abdominal ultrasound ordered which found surgically absent gallbladder otherwise unremarkable study follow-up CT recommended by radiology which is completed and found no evidence of acute inflammatory process within the abdomen, status post cholecystectomy, no small bowel obstruction. Normal appendix, no pericecal inflammation. Noted abundant stool in the splenic flexure of the colon as well as moderate stool in the descending colon and sigmoid colon, no colitis or diverticulitis. Noted focal scarring left anterior pelvic wall with mild stranding of surrounding fat with mild inflammatory changes a scar or even endometrial seeding be excluded and recommended follow-up.  Today she states her abdominal pain varies, some days better and some worse. Is persistent. Has daily bowel movement but sensation of incomplete emptying. Denies hematochezia and melena. Stools tend to be hard and requires straining. Drinks 1-2 cups water a day (32 ounces approximately), eats minimal fiber and eats poorly due to hectic schedule with school. Occasional nausea, denies vomiting. Denies fever, chills, unintentional weight loss, changes in bowel habits. Denies chest pain, dyspnea,  dizziness, lightheadedness, syncope, near syncope. Denies any other upper or lower GI symptoms.   Past Medical History  Diagnosis Date  . 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 Seizure [facility])     Past Surgical History  Procedure Laterality Date  . Cochlear implant      @ age 75  . Cholecystectomy    . Cesarean section      @ age 8    Current Outpatient Prescriptions  Medication Sig Dispense Refill  . estradiol (ESTRACE) 2 MG tablet Take 2 mg by mouth daily.    Marland Kitchen levETIRAcetam (KEPPRA) 1000 MG tablet Take 1 tablet (1,000 mg total) by mouth 2 (two) times daily. 180 tablet 4   No current facility-administered medications for this visit.    Allergies as of 07/14/2015 - Review Complete 07/14/2015  Allergen Reaction Noted  . Amoxicillin  07/30/2013  . Penicillins  07/30/2013  . Sulfa antibiotics  07/30/2013    Family History  Problem Relation Age of Onset  . Colon cancer Maternal Grandfather     Social History   Social History  . Marital Status: Single    Spouse Name: N/A  . Number of Children: 1  . Years of Education: College   Occupational History  .      n/a-unemployed   Social History Main Topics  . Smoking status: Former Smoker    Types: Cigarettes    Quit date: 09/27/2012  . Smokeless tobacco: Never Used  . Alcohol Use: No     Comment: quit 11/2010  . Drug Use: No     Comment: marijuana; quit 3/ 2012  . Sexual Activity: Not on file   Other Topics Concern  .  Not on file   Social History Narrative   Patient is single with one child.   Patient is right handed.   Patient has hs education and is currently in college.   Patient does not drink any caffeine.    Review of Systems: General: Negative for anorexia, weight loss, fever, chills, fatigue, weakness. Eyes: Negative for vision changes.  ENT: Negative for hoarseness, difficulty swallowing. CV: Negative for chest pain, angina, palpitations, peripheral edema.  Respiratory: Negative for  dyspnea at rest, cough, sputum, wheezing.  GI: See history of present illness. Derm: Negative for rash or itching.  Endo: Negative for unusual weight change.  Heme: Negative for bruising or bleeding. Allergy: Negative for rash or hives.    Physical Exam: BP 145/75 mmHg  Pulse 82  Temp(Src) 98.1 F (36.7 C) (Oral)  Ht 5\' 7"  (1.702 m)  Wt 297 lb 9.6 oz (134.99 kg)  BMI 46.60 kg/m2 General:   Obese female alert and oriented. Pleasant and cooperative. Well-nourished and well-developed.  Head:  Normocephalic and atraumatic. Eyes:  Without icterus, sclera clear and conjunctiva pink.  Ears:  Patient is deaf and is assisted by ASL translator. Cardiovascular:  S1, S2 present without murmurs appreciated. Extremities without clubbing or edema. Respiratory:  Clear to auscultation bilaterally. No wheezes, rales, or rhonchi. No distress.  Gastrointestinal:  +BS, soft, and non-distended. Mild LLQ and left-mid abdominal TTP. No HSM noted. No guarding or rebound. No masses appreciated.  Rectal:  Deferred  Skin:  Intact without significant lesions or rashes. Neurologic:  Alert and oriented x4;  grossly normal neurologically. Psych:  Alert and cooperative. Normal mood and affect. Heme/Lymph/Immune: No excessive bruising noted.    07/14/2015 11:50 AM

## 2015-07-15 NOTE — Progress Notes (Signed)
CC'D TO PCP °

## 2015-07-28 ENCOUNTER — Telehealth: Payer: Self-pay

## 2015-07-28 NOTE — Telephone Encounter (Signed)
pts interpreter called- she has been taking the linzess 145mg  daily and it is not helping at all. She cannot remember when her last bm was. She is having a lot of pain as well. She has not seen any blood. On Saturday, she was trying to have a bm and couldn't and got dizzy and felt like she was going to pass out.   She said when I call back, if the interpreter is not there, to leave a message and she will have someone interpret it for her.

## 2015-07-28 NOTE — Telephone Encounter (Signed)
Lets try Linzess 290 mcg daily. We'll see if that helps. She can pick up samples to get her started.

## 2015-07-29 NOTE — Telephone Encounter (Signed)
Tried to call pt, number goes thru an interpreter line. Pt was not at home. LM on answering machine with recommendations. Samples are at the front desk with instructions.

## 2015-08-14 ENCOUNTER — Encounter: Payer: Self-pay | Admitting: Nurse Practitioner

## 2015-08-14 ENCOUNTER — Ambulatory Visit (INDEPENDENT_AMBULATORY_CARE_PROVIDER_SITE_OTHER): Payer: Medicaid Other | Admitting: Nurse Practitioner

## 2015-08-14 VITALS — BP 145/80 | HR 85 | Temp 98.5°F | Ht 67.0 in | Wt 300.8 lb

## 2015-08-14 DIAGNOSIS — R1032 Left lower quadrant pain: Secondary | ICD-10-CM | POA: Diagnosis not present

## 2015-08-14 DIAGNOSIS — K5900 Constipation, unspecified: Secondary | ICD-10-CM

## 2015-08-14 NOTE — Progress Notes (Signed)
Referring Provider: Denny Levy, [place] Primary Care Physician:  Denny Levy, [place] Primary GI:  Dr. Gala Romney  Chief Complaint  Patient presents with  . Follow-up    abd pain    HPI:   34 year old female presents for follow-up on left lower quadrant pain and constipation. At her last visit she noted continued abdominal pain with a CT showing abundant stool in the splenic flexure and moderate stool in the descending and sigmoid colon. She was having daily bowel movements but incomplete emptying. We started her on Linzess 145 g once daily. She called in saying this is not helping so her dose was increased to 290 g daily.  Patient is deal and the visit is assisted today with ASL translator/interpreter.Today she states Linzess did work well. The increased dose helped for a few days then stopped. Still having abdominal pain and incomplete emptying. Will sometimes have the sensation of needing to go but will not be able to. Denies hematochezia and melena. Stool color was abnormal described as orange/pink but only occurred once. Denies N/V. Denies chest pain, dyspnea, dizziness, lightheadedness, syncope, near syncope. Denies any other upper or lower GI symptoms.  Past Medical History  Diagnosis Date  . 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 Seizure [facility])     Past Surgical History  Procedure Laterality Date  . Cochlear implant      @ age 49  . Cholecystectomy    . Cesarean section      @ age 55    Current Outpatient Prescriptions  Medication Sig Dispense Refill  . estradiol (ESTRACE) 2 MG tablet Take 2 mg by mouth daily.    Marland Kitchen levETIRAcetam (KEPPRA) 1000 MG tablet Take 1 tablet (1,000 mg total) by mouth 2 (two) times daily. 180 tablet 4  . Linaclotide (LINZESS) 290 MCG CAPS capsule Take 290 mcg by mouth daily.     No current facility-administered medications for this visit.    Allergies as of 08/14/2015 - Review Complete 08/14/2015  Allergen Reaction Noted  . Amoxicillin   07/30/2013  . Penicillins  07/30/2013  . Sulfa antibiotics  07/30/2013    Family History  Problem Relation Age of Onset  . Colon cancer Maternal Grandfather     Social History   Social History  . Marital Status: Single    Spouse Name: N/A  . Number of Children: 1  . Years of Education: College   Occupational History  .      n/a-unemployed   Social History Main Topics  . Smoking status: Former Smoker    Types: Cigarettes    Quit date: 09/27/2012  . Smokeless tobacco: Never Used  . Alcohol Use: No     Comment: quit 11/2010  . Drug Use: No     Comment: marijuana; quit 3/ 2012  . Sexual Activity: Not Asked   Other Topics Concern  . None   Social History Narrative   Patient is single with one child.   Patient is right handed.   Patient has hs education and is currently in college.   Patient does not drink any caffeine.    Review of Systems: General: Negative for anorexia, weight loss, fever, chills, fatigue, weakness. ENT: Negative for hoarseness, difficulty swallowing. CV: Negative for chest pain, angina, palpitations, peripheral edema.  Respiratory: Negative for dyspnea at rest, cough, sputum, wheezing.  GI: See history of present illness. Endo: Negative for unusual weight change.    Physical Exam: BP 145/80 mmHg  Pulse 85  Temp(Src) 98.5 F (36.9 C) (Oral)  Ht 5\' 7"  (1.702 m)  Wt 300 lb 12.8 oz (136.442 kg)  BMI 47.10 kg/m2 General:   Alert and oriented. Pleasant and cooperative. Well-nourished and well-developed.  Head:  Normocephalic and atraumatic. Ears:  Patient is deaf. Cardiovascular:  S1, S2 present without murmurs appreciated. Extremities without clubbing or edema. Respiratory:  Clear to auscultation bilaterally. No wheezes, rales, or rhonchi. No distress.  Gastrointestinal:  +BS, rounded but soft, non-tender and non-distended. No HSM noted. No guarding or rebound. No masses appreciated.  Rectal:  Deferred  Neurologic:  Alert and oriented x4;   grossly normal neurologically. Psych:  Alert and cooperative. Normal mood and affect.    08/14/2015 11:06 AM

## 2015-08-14 NOTE — Patient Instructions (Signed)
1. Stop taking Linzess. 2. We will start her on Amitiza 24 g twice daily. We will give you samples for 2-3 weeks.  3. Call in a couple weeks and let me know how is working.  4. Return for follow-up in 4 weeks.

## 2015-08-18 NOTE — Assessment & Plan Note (Addendum)
Patient with abdominal pain likely due to IBS-C. Change in medication regimen as noted above. She is instructed to call the office with results of the new medication. Return for follow-up in 4 weeks.

## 2015-08-18 NOTE — Assessment & Plan Note (Addendum)
Patient with constipation which failed Linzess. We will have her stop Linzess as it only worked for about 2-3 days then stop. We'll trial Amitiza 24 g twice a day and provide samples for 2-3 weeks. She states she will call us with the results in about 2 weeks to let us know if the medication is working any better. Return for follow-up in 4 weeks to reevaluate symptoms and make further plans.

## 2015-08-18 NOTE — Progress Notes (Signed)
CC'D TO PCP °

## 2015-09-16 ENCOUNTER — Ambulatory Visit: Payer: Medicaid Other | Admitting: Nurse Practitioner

## 2015-09-17 ENCOUNTER — Encounter: Payer: Self-pay | Admitting: Internal Medicine

## 2015-09-17 ENCOUNTER — Ambulatory Visit (INDEPENDENT_AMBULATORY_CARE_PROVIDER_SITE_OTHER): Payer: Medicaid Other | Admitting: Nurse Practitioner

## 2015-09-17 ENCOUNTER — Encounter: Payer: Self-pay | Admitting: Nurse Practitioner

## 2015-09-17 VITALS — BP 132/91 | HR 67 | Temp 97.1°F | Ht 67.0 in | Wt 306.0 lb

## 2015-09-17 DIAGNOSIS — R1032 Left lower quadrant pain: Secondary | ICD-10-CM | POA: Diagnosis not present

## 2015-09-17 DIAGNOSIS — K5900 Constipation, unspecified: Secondary | ICD-10-CM | POA: Diagnosis not present

## 2015-09-17 NOTE — Progress Notes (Signed)
CC'ED TO PCP 

## 2015-09-17 NOTE — Assessment & Plan Note (Addendum)
Patient because he failed Linzess at low-dose and high-dose. Stop taking Amitiza due to intolerable side effects including nausea, vomiting, worsening abdominal pain, and headaches. At this point we will try increasing water, adding a fiber supplement daily, MiraLAX once a day with a second dose in the evenings as needed for no bowel movement in 1-2 days. She will call for symptoms worsen or do not improve despite this regimen. At this point we can try Movantik as a last option. Return for follow-up in 3 months regardless.

## 2015-09-17 NOTE — Assessment & Plan Note (Signed)
Abdominal pain became worse on Amitiza as well as other side effects including nausea, vomiting, headaches. She stopped taking Amitiza. Overall her abdominal pain is somewhat better than it had been. She is okay trying let her options for her constipation which is likely the etiology of her abdominal pain. See constipation management. Return for follow-up in 3 months.

## 2015-09-17 NOTE — Patient Instructions (Signed)
1. Make sure you're drinking plenty of water every day. 2. Start taking a daily fiber supplement. You can find use over-the-counter at the pharmacy. Ask the pharmacist for advice if you have trouble picking 1. 3. Take MiraLAX once a day. If you do not have a bowel movement of 1-2 days he can try a second dose in the evenings as needed. 4. Call us if you have worsening symptoms are there is no improvement given all of this. At that point we can try prescription of Movantik which is another constipation medication. 5. Return for follow-up in 3 months we can see how you're doing.

## 2015-09-17 NOTE — Progress Notes (Signed)
Referring Provider: Denny Levy, [place] Primary Care Physician:  Denny Levy, [place] Primary GI:  Dr. Gala Romney  Chief Complaint  Patient presents with  . Follow-up    HPI:   34 year old female presents for follow-up on abdominal pain and constipation. She is previously trialed on Linzess as well as an increased dose of Linzess which worked well for 2-3 days and it stopped working. She was switched Amitiza 24 g at her last office visit on 08/14/2015 and recommended follow-up in 4 weeks. Of note she is deaf and her visit is typically assisted with American sign language interpreter.  Today she states she stopped taking Amitiza due to N/V, headaches. Overall, even though not on any medication, her symptoms are a little better then hthey have been but still not great. Denies hematochezia, melena, severe abdominal pain, N/V. Denies chest pain, dyspnea, dizziness, lightheadedness, syncope, near syncope. Denies any other upper or lower GI symptoms.  Past Medical History  Diagnosis Date  . 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 Seizure [facility])     Past Surgical History  Procedure Laterality Date  . Cochlear implant      @ age 80  . Cholecystectomy    . Cesarean section      @ age 87    Current Outpatient Prescriptions  Medication Sig Dispense Refill  . estradiol (ESTRACE) 2 MG tablet Take 2 mg by mouth daily.    Marland Kitchen levETIRAcetam (KEPPRA) 1000 MG tablet Take 1 tablet (1,000 mg total) by mouth 2 (two) times daily. 180 tablet 4   No current facility-administered medications for this visit.    Allergies as of 09/17/2015 - Review Complete 09/17/2015  Allergen Reaction Noted  . Amoxicillin  07/30/2013  . Penicillins  07/30/2013  . Sulfa antibiotics  07/30/2013    Family History  Problem Relation Age of Onset  . Colon cancer Maternal Grandfather     Social History   Social History  . Marital Status: Single    Spouse Name: N/A  . Number of Children: 1  . Years of  Education: College   Occupational History  .      n/a-unemployed   Social History Main Topics  . Smoking status: Former Smoker    Types: Cigarettes    Quit date: 09/27/2012  . Smokeless tobacco: Never Used  . Alcohol Use: No     Comment: quit 11/2010  . Drug Use: No     Comment: marijuana; quit 3/ 2012  . Sexual Activity: Not Asked   Other Topics Concern  . None   Social History Narrative   Patient is single with one child.   Patient is right handed.   Patient has hs education and is currently in college.   Patient does not drink any caffeine.    Review of Systems: General: Negative for anorexia, weight loss, fever, chills, fatigue, weakness. ENT: Negative for hoarseness, difficulty swallowing. CV: Negative for chest pain, angina, palpitations, peripheral edema.  Respiratory: Negative for dyspnea at rest, cough, sputum, wheezing.  GI: See history of present illness. Endo: Negative for unusual weight change.    Physical Exam: BP 132/91 mmHg  Pulse 67  Temp(Src) 97.1 F (36.2 C)  Ht 5\' 7"  (1.702 m)  Wt 306 lb (138.801 kg)  BMI 47.92 kg/m2 General:   Morbidly obese female, alert and oriented. Pleasant and cooperative. Well-nourished and well-developed.  Head:  Normocephalic and atraumatic. Eyes:  Without icterus, sclera clear and conjunctiva pink.  Ears:  Deaf. Cardiovascular:  S1, S2 present without murmurs appreciated. Extremities without clubbing or edema. Respiratory:  Clear to auscultation bilaterally. No wheezes, rales, or rhonchi. No distress.  Gastrointestinal:  +BS, soft, non-tender and non-distended. No HSM noted. No guarding or rebound. No masses appreciated.  Rectal:  Deferred  Neurologic:  Alert and oriented x4;  grossly normal neurologically. Psych:  Alert and cooperative. Normal mood and affect. Heme/Lymph/Immune: No excessive bruising noted.    09/17/2015 11:31 AM

## 2015-10-17 ENCOUNTER — Other Ambulatory Visit: Payer: Self-pay | Admitting: Diagnostic Neuroimaging

## 2015-10-27 ENCOUNTER — Encounter (INDEPENDENT_AMBULATORY_CARE_PROVIDER_SITE_OTHER): Payer: Self-pay

## 2015-10-27 ENCOUNTER — Telehealth: Payer: Self-pay | Admitting: General Practice

## 2015-10-27 NOTE — Telephone Encounter (Signed)
Patient is calling in stating that she is having more increased abd pain.  She's been taking the Mirilax and it helped with the constipation, however she's been having increased abd pain and it's been going on for about two weeks and it's escalating.   The abd pain is so uncomfortable she's having trouble sleeping.  Patient denies having any fever nausea or rectal bleeding.  Patient can reached via my chart

## 2015-10-30 NOTE — Telephone Encounter (Signed)
I'd like to recheck a CT given how bad her abdominal pain is, especially in light of her constipation being better controlled. Please put in for CT of the abdomen and pelvis with contrast and notify the patient. Let me know if any questions.

## 2015-10-31 ENCOUNTER — Other Ambulatory Visit: Payer: Self-pay

## 2015-10-31 DIAGNOSIS — R109 Unspecified abdominal pain: Secondary | ICD-10-CM

## 2015-10-31 NOTE — Telephone Encounter (Signed)
Insurance is pending 

## 2015-11-05 NOTE — Telephone Encounter (Signed)
Tried to call no answer

## 2015-11-05 NOTE — Telephone Encounter (Signed)
PA# [id number]

## 2015-11-05 NOTE — Telephone Encounter (Signed)
Pt is set up for 11/11/15 @ 9:30. She will need to pick up her contract to drink.

## 2015-11-06 NOTE — Telephone Encounter (Signed)
Tried to call again with no answer. I am going to mail her a letter.

## 2015-11-10 NOTE — Telephone Encounter (Signed)
Pt is aware and she has changed her appointment

## 2015-11-11 ENCOUNTER — Ambulatory Visit (HOSPITAL_COMMUNITY): Payer: Medicaid Other

## 2015-11-13 ENCOUNTER — Ambulatory Visit (HOSPITAL_COMMUNITY)
Admission: RE | Admit: 2015-11-13 | Discharge: 2015-11-13 | Disposition: A | Discharge status: Home / Self Care | Payer: Medicaid Other | Source: Ambulatory Visit | Attending: Nurse Practitioner | Admitting: Nurse Practitioner

## 2015-11-13 ENCOUNTER — Encounter (HOSPITAL_COMMUNITY): Payer: Self-pay

## 2015-11-13 DIAGNOSIS — R109 Unspecified abdominal pain: Secondary | ICD-10-CM | POA: Insufficient documentation

## 2015-11-13 DIAGNOSIS — R1900 Intra-abdominal and pelvic swelling, mass and lump, unspecified site: Secondary | ICD-10-CM | POA: Diagnosis not present

## 2015-11-13 MED ORDER — IOHEXOL 300 MG/ML  SOLN
100.0000 mL | Freq: Once | INTRAMUSCULAR | Status: AC | PRN
  Administered 2015-11-13: 100 mL via INTRAVENOUS

## 2015-11-14 ENCOUNTER — Telehealth: Payer: Self-pay | Admitting: Nurse Practitioner

## 2015-11-14 NOTE — Telephone Encounter (Signed)
Attempted call to patient through hearing impaired itnerpreter x 2. Interpreter stated her video phone could not be located and she may have turned it off for now. Will try and call back next week.

## 2015-11-18 ENCOUNTER — Telehealth: Payer: Self-pay

## 2015-11-18 ENCOUNTER — Telehealth: Payer: Self-pay | Admitting: General Practice

## 2015-11-18 ENCOUNTER — Encounter: Payer: Self-pay | Admitting: General Practice

## 2015-11-18 DIAGNOSIS — D481 Neoplasm of uncertain behavior of connective and other soft tissue: Secondary | ICD-10-CM

## 2015-11-18 NOTE — Telephone Encounter (Signed)
Pt is aware of the CT results and she is fine with doing the bx. She would like to try and have it done on 12/03/15 if possible.

## 2015-11-18 NOTE — Telephone Encounter (Signed)
I called IR and was told by [phone]) that they the order is in review and they will get back to [place] with scheduling and it could take 24 hours for scheduling.

## 2015-11-18 NOTE — Telephone Encounter (Signed)
I spoke with the interventional radiologist today and he's ok with proceeding with the biopsy. He said it's pending with their scheduling people and we could call them and they will assist with scheduling. Thanks.

## 2015-11-18 NOTE — Telephone Encounter (Signed)
I sent a message to the patient in My Chart asking her to give our office a call to discuss her ct results.

## 2015-11-19 ENCOUNTER — Encounter: Payer: Self-pay | Admitting: General Practice

## 2015-11-19 NOTE — Telephone Encounter (Signed)
Pt is scheduled for Bx on 12/03/15 to arrive at 6:30 am.  She will need to have a driver because she will be sedated and nothing to eat by mouth after midnight the night before.  She will need to report to Short Stay on the second floor at [facility].

## 2015-12-02 ENCOUNTER — Other Ambulatory Visit: Payer: Self-pay | Admitting: General Surgery

## 2015-12-03 ENCOUNTER — Ambulatory Visit (HOSPITAL_COMMUNITY): Payer: Medicaid Other

## 2015-12-03 ENCOUNTER — Encounter (HOSPITAL_COMMUNITY): Payer: Self-pay

## 2015-12-03 ENCOUNTER — Ambulatory Visit (HOSPITAL_COMMUNITY)
Admission: RE | Admit: 2015-12-03 | Discharge: 2015-12-03 | Disposition: A | Discharge status: Home / Self Care | Payer: Medicaid Other | Source: Ambulatory Visit | Attending: Nurse Practitioner | Admitting: Nurse Practitioner

## 2015-12-03 DIAGNOSIS — N809 Endometriosis, unspecified: Secondary | ICD-10-CM | POA: Diagnosis not present

## 2015-12-03 DIAGNOSIS — D481 Neoplasm of uncertain behavior of connective and other soft tissue: Secondary | ICD-10-CM

## 2015-12-03 DIAGNOSIS — R1900 Intra-abdominal and pelvic swelling, mass and lump, unspecified site: Secondary | ICD-10-CM | POA: Insufficient documentation

## 2015-12-03 LAB — CBC
HEMATOCRIT: 43.4 % (ref 36.0–46.0)
HEMOGLOBIN: 14.4 g/dL (ref 12.0–15.0)
MCH: 28.6 pg (ref 26.0–34.0)
MCHC: 33.2 g/dL (ref 30.0–36.0)
MCV: 86.3 fL (ref 78.0–100.0)
Platelets: 200 10*3/uL (ref 150–400)
RBC: 5.03 MIL/uL (ref 3.87–5.11)
RDW: 13.2 % (ref 11.5–15.5)
WBC: 5.8 10*3/uL (ref 4.0–10.5)

## 2015-12-03 LAB — APTT: aPTT: 28 seconds (ref 24–37)

## 2015-12-03 LAB — PROTIME-INR
INR: 1 (ref 0.00–1.49)
Prothrombin Time: 13.4 seconds (ref 11.6–15.2)

## 2015-12-03 MED ORDER — LIDOCAINE HCL (PF) 1 % IJ SOLN
INTRAMUSCULAR | Status: AC
  Filled 2015-12-03: qty 10

## 2015-12-03 MED ORDER — MIDAZOLAM HCL 2 MG/2ML IJ SOLN
INTRAMUSCULAR | Status: AC
  Filled 2015-12-03: qty 2

## 2015-12-03 MED ORDER — FENTANYL CITRATE (PF) 100 MCG/2ML IJ SOLN
INTRAMUSCULAR | Status: AC | PRN
  Administered 2015-12-03: 100 ug via INTRAVENOUS

## 2015-12-03 MED ORDER — SODIUM CHLORIDE 0.9 % IV SOLN: INTRAVENOUS | Status: DC

## 2015-12-03 MED ORDER — FENTANYL CITRATE (PF) 100 MCG/2ML IJ SOLN
INTRAMUSCULAR | Status: AC
  Filled 2015-12-03: qty 2

## 2015-12-03 MED ORDER — MIDAZOLAM HCL 2 MG/2ML IJ SOLN
INTRAMUSCULAR | Status: AC | PRN
  Administered 2015-12-03: 2 mg via INTRAVENOUS

## 2015-12-03 NOTE — Procedures (Signed)
Interventional Radiology Procedure Note  Procedure:  Ultrasound guided core biopsy of abdominal wall mass  Complications:  None  Estimated Blood Loss: < 10 mL  3 cm abdominal wall mass sampled with 16 G core device x 4.  Cores submitted in formalin.  Venetia Night. Kathlene Cote, M.D Pager:  [phone]

## 2015-12-03 NOTE — Sedation Documentation (Signed)
Patient denies pain and is resting comfortably.  

## 2015-12-03 NOTE — Discharge Instructions (Signed)
Needle Biopsy, Care After °These instructions give you information about caring for yourself after your procedure. Your doctor may also give you more specific instructions. Call your doctor if you have any problems or questions after your procedure. °HOME CARE °· Rest as told by your doctor. °· Take medicines only as told by your doctor. °· There are many different ways to close and cover the biopsy site, including stitches (sutures), skin glue, and adhesive strips. Follow instructions from your doctor about: °¨ How to take care of your biopsy site. °¨ When and how you should change your bandage (dressing). °¨ When you should remove your dressing. °¨ Removing whatever was used to close your biopsy site. °· Check your biopsy site every day for signs of infection. Watch for: °¨ Redness, swelling, or pain. °¨ Fluid, blood, or pus. °GET HELP IF: °· You have a fever. °· You have redness, swelling, or pain at the biopsy site, and it lasts longer than a few days. °· You have fluid, blood, or pus coming from the biopsy site. °· You feel sick to your stomach (nauseous). °· You throw up (vomit). °GET HELP RIGHT AWAY IF: °· You are short of breath. °· You have trouble breathing. °· Your chest hurts. °· You feel dizzy or you pass out (faint). °· You have bleeding that does not stop with pressure or a bandage. °· You cough up blood. °· Your belly (abdomen) hurts. °  °This information is not intended to replace advice given to you by your health care provider. Make sure you discuss any questions you have with your health care provider. °  °Document Released: 08/26/2008 Document Revised: 01/28/2015 Document Reviewed: 09/09/2014 °Elsevier Interactive Patient Education ©2016 Elsevier Inc. ° °

## 2015-12-03 NOTE — H&P (Signed)
Chief Complaint: Patient was seen in consultation today for biopsy of abdominal wall at the request of Vivi Barrack, NP  Referring Physician(s): Walden Field NP  Supervising Physician: Aletta Edouard  History of Present Illness: Mercedes Parks is a 35 y.o. female referred to IR for biopsy of abdominal wall mass. Apparently she had this biopsied in 2012 at an outside facility, I do not have a pathology report from that. She has had some abd pain and on recent CT, there remains a 3.4 x 2.4 cm abdominal wall mass. 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 Sign language interpreter is present with pt, as well as her Aunt. Has been NPO since last pm. C/o mild headache as she had a tooth pulled last week.   Past Medical History  Diagnosis Date  . 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 Seizure [facility])     Past Surgical History  Procedure Laterality Date  . Cochlear implant      @ age 7  . Cholecystectomy    . Cesarean section      @ age 88    Allergies: Amoxicillin; Hydrocodone; Penicillins; and Sulfa antibiotics  Medications: Prior to Admission medications   Medication Sig Start Date End Date Taking? Authorizing Provider  estradiol (ESTRACE) 2 MG tablet Take 2 mg by mouth daily.   Yes Historical Provider, MD  levETIRAcetam (KEPPRA) 1000 MG tablet TAKE 1 TABLET BY MOUTH TWICE A DAY. 10/17/15  Yes Penni Bombard, MD     Family History  Problem Relation Age of Onset  . Colon cancer Maternal Grandfather     Social History   Social History  . Marital Status: Single    Spouse Name: N/A  . Number of Children: 1  . Years of Education: College   Occupational History  .      n/a-unemployed   Social History Main Topics  . Smoking status: Former Smoker    Types: Cigarettes    Quit date: 09/27/2012  . Smokeless tobacco: Never Used  . Alcohol Use: No     Comment: quit 11/2010  . Drug Use: No     Comment: marijuana; quit 3/ 2012  .  Sexual Activity: Not Asked   Other Topics Concern  . None   Social History Narrative   Patient is single with one child.   Patient is right handed.   Patient has hs education and is currently in college.   Patient does not drink any caffeine.     Review of Systems: A 12 point ROS discussed and pertinent positives are indicated in the HPI above.  All other systems are negative.  Review of Systems  Vital Signs: BP 133/87 mmHg  Pulse 86  Temp(Src) 97.9 F (36.6 C) (Oral)  Resp 18  Ht 5\' 7"  (1.702 m)  Wt 309 lb (140.161 kg)  BMI 48.38 kg/m2  SpO2 98%  Physical Exam  Constitutional: She is oriented to person, place, and time. She appears well-developed and well-nourished. No distress.  HENT:  Head: Normocephalic.  Mouth/Throat: Oropharynx is clear and moist.  Neck: Normal range of motion. No tracheal deviation present.  Cardiovascular: Normal rate, regular rhythm and normal heart sounds.   Pulmonary/Chest: Effort normal and breath sounds normal. No respiratory distress.  Abdominal: Soft. She exhibits no mass. There is no tenderness.  Neurological: She is alert and oriented to person, place, and time.  Psychiatric: She has a normal  mood and affect.    Mallampati Score:  MD Evaluation Airway: WNL Heart: WNL Abdomen: WNL Chest/ Lungs: WNL ASA  Classification: 2 Mallampati/Airway Score: One  Imaging: Ct Abdomen Pelvis W Contrast  11/13/2015  CLINICAL DATA:  Low pelvic pain for 1 year, history of cholecystectomy hysterectomy EXAM: CT ABDOMEN AND PELVIS WITH CONTRAST TECHNIQUE: Multidetector CT imaging of the abdomen and pelvis was performed using the standard protocol following bolus administration of intravenous contrast. CONTRAST:  148mL OMNIPAQUE IOHEXOL 300 MG/ML  SOLN COMPARISON:  CT abdomen pelvis of 06/12/2015 FINDINGS: The lung bases are clear. The liver enhances with no focal abnormality and no ductal dilatation is seen. Surgical clips are present from prior  cholecystectomy. The pancreas is normal in size and configuration and the pancreatic duct is not dilated. The adrenal glands and spleen are unremarkable. The stomach is only moderately distended with contrast with no abnormality noted. The kidneys enhance with no evidence of renal calculi, mass, or hydronephrosis. The abdominal aorta is normal in caliber. No adenopathy is seen. Abutting the rectus musculature to the left of midline anteriorly on image 82 there is a poorly circumscribed soft tissue structure of 2.4 x 3.4 cm with attenuation of 42 HU. This could represent a desmoid tumor or fibrosis, with soft tissue sarcoma less likely. This area could be biopsied if warranted clinically. The uterus has previously been resected. No adnexal lesion is seen. No free fluid is noted within the pelvis. The urinary bladder is not well distended but is unremarkable. No abnormality of the colon is seen. The terminal ileum and the appendix are unremarkable. The lumbar vertebrae are in normal alignment with normal intervertebral disc spaces. IMPRESSION: 1. 2.4 x 3.4 soft tissue mass abutting the anterior abdominal wall musculature to the left of midline in the low abdomen. Consider desmoid tumor, or less likely a soft tissue sarcoma. This area could be biopsied if warranted. 2. The appendix and terminal ileum are unremarkable. Electronically Signed   By: Ivar Drape M.D.   On: 11/13/2015 09:36    Labs:  CBC:  Recent Labs  12/03/15 1146  WBC 5.8  HGB 14.4  HCT 43.4  PLT 200    COAGS:  Recent Labs  12/03/15 1146  INR 1.00  APTT 28    Assessment and Plan: Abdominal wall mass For US guided core biopsy Labs ok Risks and Benefits discussed with the patient including, but not limited to bleeding, infection, damage to adjacent structures or low yield requiring additional tests. All of the patient's questions were answered, patient is agreeable to proceed. Consent signed and in chart.   Thank you for  this interesting consult.   A copy of this report was sent to the requesting provider on this date.  Electronically Signed: Ascencion Dike 12/03/2015, 12:22 PM   I spent a total of 20 minutes in face to face in clinical consultation, greater than 50% of which was counseling/coordinating care for abdominal wall mass biopsy

## 2015-12-08 ENCOUNTER — Telehealth: Payer: Self-pay

## 2015-12-08 NOTE — Telephone Encounter (Signed)
Pt is aware of biopsy results and will follow up with her primary and OB doctor.

## 2015-12-08 NOTE — Telephone Encounter (Signed)
  Routing to [place] to make him aware

## 2015-12-08 NOTE — Telephone Encounter (Signed)
Pt called and is requesting the results for her US biopsy that she had done last week. Pt is requesting that the results me emailed via my chart due to her being deaf. It is easier for her that way

## 2015-12-08 NOTE — Telephone Encounter (Signed)
Noted  

## 2015-12-08 NOTE — Telephone Encounter (Signed)
I've already sent those to her MyChart, she can check them there.

## 2015-12-09 NOTE — Telephone Encounter (Signed)
Noted  

## 2015-12-11 ENCOUNTER — Telehealth: Payer: Self-pay | Admitting: Internal Medicine

## 2015-12-11 NOTE — Telephone Encounter (Signed)
Routing to EG 

## 2015-12-11 NOTE — Telephone Encounter (Signed)
At this point her remaining abdominal pain symptoms are likely due to the abdominal wall endometriosis mass. She can follow-up with [place] as needed (prn) for any new or recurrent symptoms, such as the constipation she was having. This is especially true if treatment of the endometriosis does not result in symptoms improvement. Thanks

## 2015-12-11 NOTE — Telephone Encounter (Signed)
Pt has appt on Tuesday and wanted to know if she needed to come in since she is not having any problems. I told her it was up to her, but she wanted the doctor (not the nurse) to determine if she needed to keep this appointment. Please advise. I have cancelled OV per patient's request and she just needs to know if she needs to reschedule.

## 2015-12-12 NOTE — Telephone Encounter (Signed)
I have sent the pt a message via mychart.  

## 2015-12-16 ENCOUNTER — Ambulatory Visit: Payer: Medicaid Other | Admitting: Nurse Practitioner

## 2016-01-13 ENCOUNTER — Other Ambulatory Visit: Payer: Self-pay | Admitting: Diagnostic Neuroimaging

## 2016-02-11 ENCOUNTER — Encounter: Payer: Self-pay | Admitting: Diagnostic Neuroimaging

## 2016-02-11 ENCOUNTER — Ambulatory Visit (INDEPENDENT_AMBULATORY_CARE_PROVIDER_SITE_OTHER): Payer: Medicaid Other | Admitting: Diagnostic Neuroimaging

## 2016-02-11 VITALS — BP 118/80 | HR 68 | Ht 67.0 in | Wt 312.0 lb

## 2016-02-11 DIAGNOSIS — G4719 Other hypersomnia: Secondary | ICD-10-CM

## 2016-02-11 DIAGNOSIS — G40309 Generalized idiopathic epilepsy and epileptic syndromes, not intractable, without status epilepticus: Secondary | ICD-10-CM | POA: Diagnosis not present

## 2016-02-11 MED ORDER — LEVETIRACETAM 1000 MG PO TABS: 1000.0000 mg | ORAL_TABLET | Freq: Two times a day (BID) | ORAL | Status: DC

## 2016-02-11 NOTE — Progress Notes (Signed)
PATIENT: Mercedes Parks DOB: 04/13/1981  REASON FOR VISIT: routine follow up for seizures HISTORY FROM: patient, with ASL interpreter  Chief Complaint  Patient presents with  . Epilepsy    rm 7, interpreter- Mercedes Parks, friend -Mercedes Parks, "no sz activity in past year"  . Follow-up    1 year    HISTORY OF PRESENT ILLNESS:  UPDATE 02/11/16: Since last visit, doing well. No seizures. Had a strange dream 2-3 weeks ago, that felt like possible seizure, then she woke up gasping for breath. Denies snoring or witnessed apnea, but lives alone. Has some daytime sleepiness even after full nights sleep.  UPDATE 02/03/15 (VRP): Since last visit, doing well. No seizures. Tolerating medications. Some more anxiety, now on cymbalta, and feels better.  UPDATE 08/05/14 (LL): Since last visit, patient has been well, no seizures. She is assisted by sign-language interpreter today. Tolerating Levitiracetam 1000 mg BID well.  Has been having some increased blurred vision and occasional diplopia, recent eye exam was normal with the exception of a change on her eyeglass Rx. She is worried about memory loss, having trouble with remembering appointments and things that she and her son have scheduled. Has lost 40 lb. In the last year.   UPDATE 01/30/14 (LL):  Mercedes Parks comes in for routine follow up, no seizures since last visit.  She had total hysterectomy in December due to endometriosis.  Tolerating Levitiracetam 1000 mg BID with only occasional dizziness.  She reports trouble staying asleep, wakes usually 2-3 times. Doing very well.  Asks about "vaping" and if it may trigger seizure.  UPDATE 07/31/13: On 07/27/13 patient was at a [place] party, had several alcoholic drinks, was exposed to secondhand marijuana smoke, and had multiple back-to-back seizures. Apparently she had several minutes of convulsions followed by several minutes of remission. Seizures started and stopped multiple times. Ambulance was called and  arrived to pick her up within 45 minutes of onset of symptoms. The symptoms continued while patient was at the hospital. She was given benzodiazepine in the ambulance. She was given 1000 mg Keppra IV at the hospital. She was continued on the same dose of antiseizure medication (levetiracetam 500 mg twice a day). Patient tells that she has been missing some doses of antiseizure medication. She misses 4-8 tablets per month. This usually happens when she falls asleep after school and misses her dose.   UPDATE 11/20/12: She is doing very well, there was no recurrent seizure, she is tolerating Keppra 500 mg twice a day.    UPDATE 02/02/12: No sz. More balance troubles, weight gain, decr energy, and fatigue. Drinks lots of "energy drinks". Having trouble with stairs (trips and falls).   UPDATE 07/06/11: Doing well. No sz. Back to driving. Now with endometriosis and planning to start lupron shots, but concerned about warning on package about epilepsy.    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 He is accompanied by her mother, father and sign language interpreter.  Age 35 months - H.flu meningitis leading to 95% bilateral hearing loss. Age 35 yrs - cochlear implants. Oct 2011 - dx'd with endometriosis; treated with tramadol; then had an episode of lightheadedness, out of body sensation, then passed out; reportedly had convulsions and tongue biting, no incontinence; then slept for 2 hours. Feb 2012 - exposure to synthetic marijuana, then had similar episode of seizure. March 2012 - several episodes of night time awakening, "weird breathing", not knowing if  she is awake or dreaming; EEG done at Dr. Josefina Parks office, reported showed signs of "primary generalized epilepsy".  April 2012 - started on LEV 500mg  BID    REVIEW OF SYSTEMS: Full 14 system review of systems performed and notable only for depression  anxiety.  ALLERGIES: Allergies  Allergen Reactions  . Amoxicillin Hives  . Hydrocodone Nausea And Vomiting  . Penicillins Hives    Has patient had a PCN reaction causing immediate rash, facial/tongue/throat swelling, SOB or lightheadedness with hypotension: Yes Has patient had a PCN reaction causing severe rash involving mucus membranes or skin necrosis: No Has patient had a PCN reaction that required hospitalization No Has patient had a PCN reaction occurring within the last 10 years: Non If all of the above answers are "NO", then may proceed with Cephalosporin use.   Mercedes Parks Antibiotics Hives    HOME MEDICATIONS: Outpatient Prescriptions Prior to Visit  Medication Sig Dispense Refill  . estradiol (ESTRACE) 2 MG tablet Take 2 mg by mouth daily.    Marland Kitchen levETIRAcetam (KEPPRA) 1000 MG tablet TAKE 1 TABLET BY MOUTH TWICE A DAY. 180 tablet 0   No facility-administered medications prior to visit.    PHYSICAL EXAM Filed Vitals:   02/11/16 1056  BP: 118/80  Pulse: 68  Height: 5\' 7"  (1.702 m)  Weight: 312 lb (141.522 kg)   Wt Readings from Last 3 Encounters:  02/11/16 312 lb (141.522 kg)  12/03/15 309 lb (140.161 kg)  09/17/15 306 lb (138.801 kg)   Body mass index is 48.85 kg/(m^2).  GENERAL EXAM: Patient is in no distress; well developed, nourished and groomed; neck is supple  CARDIOVASCULAR: Regular rate and rhythm, no murmurs, no carotid bruits  NEUROLOGIC: MENTAL STATUS: awake, alert, language fluent, comprehension intact; DEAF, USES SIGN LANGUAGE CRANIAL NERVE: pupils equal and reactive to light, visual fields full to confrontation, extraocular muscles intact, no nystagmus, facial sensation and strength symmetric, hearing intact, palate elevates symmetrically, uvula midline, shoulder shrug symmetric, tongue midline. MOTOR: normal bulk and tone, full strength in the BUE, BLE SENSORY: normal and symmetric to light touch COORDINATION: finger-nose-finger, fine finger  movements normal REFLEXES: deep tendon reflexes present and symmetric GAIT/STATION: narrow based gait     DIAGNOSTICS/IMAGING:  08/09/14 CT head - nothing acute   ASSESSMENT: 35 y.o. with multiple episodes of loss of consciousness, convulsions, tongue biting, also with out of body experience and aura prior to the spells. 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 Last seizure 07/27/13. Has been seizure free since then and compliant with medication.   Now with new event of waking up gasping for breath with seizure feeling during dream. Will check sleep study to eval for sleep apnea. Will keep LEV dosing the same for now.   Dx:  Generalized idiopathic epilepsy, not intractable, without status epilepticus ([place]) - Plan: Ambulatory referral to Sleep Studies  Morbid obesity, unspecified obesity type ([place])  Excessive daytime sleepiness     PLAN:   - continue LEV 1000mg  twice a day - check sleep study (obesity, daytime sleepiness, waking up at night gasping for breath)  Meds ordered this encounter  Medications  . levETIRAcetam (KEPPRA) 1000 MG tablet    Sig: Take 1 tablet (1,000 mg total) by mouth 2 (two) times daily.    Dispense:  180 tablet    Refill:  4   Return in about 1 year (around 02/10/2017).    Penni Bombard, MD 02/11/2016, 11:13  AM Certified in Neurology, Neurophysiology and [facility] [address] [place], [place] [address] [phone]

## 2016-02-11 NOTE — Patient Instructions (Signed)
Thank you for coming to see [place] at [facility]. I hope we have been able to provide you high quality care today.  You may receive a patient satisfaction survey over the next few weeks. We would appreciate your feedback and comments so that we may continue to improve ourselves and the health of our patients.  - continue levetiracetam 1092m twice a day   ~~~~~~~~~~~~~~~~~~~~~~~~~~~~~~~~~~~~~~~~~~~~~~~~~~~~~~~~~~~~~~~~~  DR. PENUMALLI'S GUIDE TO HAPPY AND HEALTHY LIVING These are some of my general health and wellness recommendations. Some of them may apply to you better than others. Please use common sense as you try these suggestions and feel free to ask me any questions.   ACTIVITY/FITNESS Mental, social, emotional and physical stimulation are very important for brain and body health. Try learning a new activity (arts, music, language, sports, games).  Keep moving your body to the best of your abilities. You can do this at home, inside or outside, the park, community center, gym or anywhere you like. Consider a physical therapist or personal trainer to get started. Consider the app Sworkit. Fitness trackers such as smart-watches, smart-phones or Fitbits can help as well.   NUTRITION Eat more plants: colorful vegetables, nuts, seeds and berries.  Eat less sugar, salt, preservatives and processed foods.  Avoid toxins such as cigarettes and alcohol.  Drink water when you are thirsty. Warm water with a slice of lemon is an excellent morning drink to start the day.  Consider these websites for more information The Nutrition Source (hhttps://www.henry-hernandez.biz/ Precision Nutrition (wWindowBlog.ch   RELAXATION Consider practicing mindfulness meditation or other relaxation techniques such as deep breathing, prayer, yoga, tai chi, massage. See website mindful.org or the apps Headspace or Calm to help get  started.   SLEEP Try to get at least 7-8+ hours sleep per day. Regular exercise and reduced caffeine will help you sleep better. Practice good sleep hygeine techniques. See website sleep.org for more information.   PLANNING Prepare estate planning, living will, healthcare POA documents. Sometimes this is best planned with the help of an attorney. Theconversationproject.org and agingwithdignity.org are excellent resources.

## 2016-03-08 ENCOUNTER — Institutional Professional Consult (permissible substitution): Payer: Medicaid Other | Admitting: Neurology

## 2016-03-08 ENCOUNTER — Telehealth: Payer: Self-pay

## 2016-03-08 ENCOUNTER — Telehealth: Payer: Self-pay | Admitting: Diagnostic Neuroimaging

## 2016-03-08 NOTE — Telephone Encounter (Signed)
Patient is taking Norethindrone 5 MG and she found out about the side effects 2 days ago. She wants to know if it is safe to take because she is concerned about the side effects-it is for her endometriosis. The best number to contact patient is [phone]

## 2016-03-08 NOTE — Telephone Encounter (Signed)
Called and left message to reschedule

## 2016-03-08 NOTE — Telephone Encounter (Signed)
I called pt to reschedule her appt. No answer, left a message on her home number asking her tocall me back (via interpreter). Dr. Brett Fairy is out sick today- her appt needs to be rescheduled.

## 2016-03-08 NOTE — Telephone Encounter (Signed)
Through deaf interpreting service spoke with patient who stated she is concerned about taking Norethindrone because she has history of seizures. She wants to be certain it is okay for her to  take it. Advised this RN will discuss with Dr Leta Baptist and call her back with his response. She verbalized understanding, appreciation.

## 2016-03-08 NOTE — Telephone Encounter (Signed)
Through deaf interpreting service, LVM giving patient Dr Gladstone Lighter reply with call back name, number for any questions.

## 2016-03-08 NOTE — Telephone Encounter (Signed)
Reached answering service; did not leave message. Will try again later.

## 2016-03-08 NOTE — Telephone Encounter (Signed)
It appears pt came in to the office and was rescheduled for July.

## 2016-03-08 NOTE — Telephone Encounter (Signed)
Ok to take Norethindrone. It does not interact with her seizure meds. It should not be an issue even though she has seizure disorder. -VRP

## 2016-04-05 ENCOUNTER — Ambulatory Visit (INDEPENDENT_AMBULATORY_CARE_PROVIDER_SITE_OTHER): Payer: Medicaid Other | Admitting: Neurology

## 2016-04-05 ENCOUNTER — Encounter: Payer: Self-pay | Admitting: Neurology

## 2016-04-05 VITALS — BP 120/82 | HR 80 | Resp 20 | Ht 67.0 in | Wt 286.0 lb

## 2016-04-05 DIAGNOSIS — R5383 Other fatigue: Secondary | ICD-10-CM | POA: Insufficient documentation

## 2016-04-05 DIAGNOSIS — T732XXS Exhaustion due to exposure, sequela: Secondary | ICD-10-CM

## 2016-04-05 DIAGNOSIS — R0683 Snoring: Secondary | ICD-10-CM

## 2016-04-05 DIAGNOSIS — G4730 Sleep apnea, unspecified: Secondary | ICD-10-CM | POA: Diagnosis not present

## 2016-04-05 DIAGNOSIS — G4710 Hypersomnia, unspecified: Secondary | ICD-10-CM

## 2016-04-05 DIAGNOSIS — G000 Hemophilus meningitis: Secondary | ICD-10-CM | POA: Diagnosis not present

## 2016-04-05 DIAGNOSIS — H903 Sensorineural hearing loss, bilateral: Secondary | ICD-10-CM

## 2016-04-05 NOTE — Progress Notes (Signed)
SLEEP MEDICINE CLINIC   Provider:  Larey Seat, M D  Referring Provider: Denny Levy, [place] Primary Care Physician:  Denny Levy, [place]   HPI:  Mercedes Parks is a 35 y.o. female , seen here as a referral from Dr. Leta Baptist for a sleep evaluation, based on a possible seizure activity at night and on morbid obesity, planning to undergo  gastric bypass.   Chief complaint according to patient : The patient informed me through her interpreter of American sign language that she is in the process of changing her diet and eating habits in preparation for gastric bypass surgery. It would be beneficial for her to have any evidence of obesity hypoventilation syndrome or obstructive sleep apnea documented prior to surgery.  Sleep habits are as follows: The patient reports going to bed between 8 and 10 PM, she will fall asleep within 45 -75 minutes. She reports 2-3 arousals at night sometimes provoked by visit dreams that she interprets as possible seizures. These are scary to her and arouse her from sleep and there is a certain stress and fear component it is difficult for her to resume sleep after those arousals. She describes her bedroom is cool, quiet and dark and she likes a background fan. She likes to watch TV in the bedroom until she is sleepy enough. She usually can sleep about 2-3 hours before her first arousal, she does not have nocturia, she does not have nocturnal headaches. She has felt very tight in her chest and neck and has reported air hungry now's or shortness of breath at night. Her child has reported that she snores and she feels herself that she snores. She may have had apnea at night as well irregular breathing or breath holding spells. She rises in the morning between 6 and 7 AM, estimating an overall nocturnal sleep time of 4-6 hours. She does not nap in daytime. She has a positive fat pillow at night she reports and she reports sleeping on her side. She does not report  finding herself in supine and she wakes up from her presumed apneic spells.   Sleep medical history and family sleep history:  None known.  She had her tonsils surgically removed, no history of sinus or nasal septal surgery, 2 wisdom teeth were removed. 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   Social history:  Mother of a 77 year old son, single mother. No smoking, rare alcohol use, drinks tea when eating out, no sodas and no coffee for over one month.    Review of Systems: Out of a complete 14 system review, the patient complains of only the following symptoms, and all other reviewed systems are negative. Snoring, gasping for air, long sleep latency  Epworth score 12 , Fatigue severity score 52  , depression score 1/ PHQ 2    Social History   Social History  . Marital Status: Single    Spouse Name: N/A  . Number of Children: 1  . Years of Education: College   Occupational History  .      n/a-unemployed   Social History Main Topics  . Smoking status: Former Smoker    Types: Cigarettes    Quit date: 09/27/2012  . Smokeless tobacco: Never Used  . Alcohol Use: No     Comment: quit 11/2010  . Drug Use: No     Comment: marijuana; quit 3/ 2012  . Sexual Activity:  Not on file   Other Topics Concern  . Not on file   Social History Narrative   Patient is single with one child.   Patient is right handed.   Patient has hs education and is currently in college.   Patient does not drink any caffeine.    Family History  Problem Relation Age of Onset  . Colon cancer Maternal Grandfather   . Healthy Mother   . Healthy Father   . Seizures Neg Hx     Past Medical History  Diagnosis Date  . 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 Seizure [facility](HCC)     Past Surgical History  Procedure Laterality Date  . Cochlear implant      @ age 35  . Cholecystectomy    . Cesarean section      @ age  35    Current Outpatient Prescriptions  Medication Sig Dispense Refill  . estradiol (ESTRACE) 2 MG tablet Take 2 mg by mouth daily.    Marland Kitchen. leuprolide (LUPRON DEPOT, 95-MONTH,) 22.5 MG injection     . levETIRAcetam (KEPPRA) 1000 MG tablet Take 1 tablet (1,000 mg total) by mouth 2 (two) times daily. 180 tablet 4  . naproxen (NAPROSYN) 500 MG tablet 500 mg. As needed for endometriosis  0  . norethindrone (AYGESTIN) 5 MG tablet Take by mouth.     No current facility-administered medications for this visit.    Allergies as of 04/05/2016 - Review Complete 04/05/2016  Allergen Reaction Noted  . Amoxicillin Hives 07/30/2013  . Hydrocodone Nausea And Vomiting 12/01/2015  . Penicillins Hives 07/30/2013  . Sulfa antibiotics Hives 07/30/2013    Vitals: BP 120/82 mmHg  Pulse 80  Resp 20  Ht 5\' 7"  (1.702 m)  Wt 286 lb (129.729 kg)  BMI 44.78 kg/m2 Last Weight:  Wt Readings from Last 1 Encounters:  04/05/16 286 lb (129.729 kg)   WUJ:WJXBBMI:Body mass index is 44.78 kg/(m^2).     Last Height:   Ht Readings from Last 1 Encounters:  04/05/16 5\' 7"  (1.702 m)    Physical exam:  General: The patient is awake, alert and appears not in acute distress. The patient is well groomed. Head: Normocephalic, atraumatic. Neck is supple. Mallampati3  neck circumference:15. Nasal airflow patent , no bruxism marks evident . Retrognathia is not seen.  Cardiovascular:  Regular rate and rhythm, without  murmurs or carotid bruit, and without distended neck veins. Respiratory: Lungs are clear to auscultation. Skin:  Without evidence of edema, or rash, significant tattooes. Trunk: BMI is 45. The patient's posture is erect   Neurologic exam : The patient is awake and alert, oriented to place and time.   Memory subjective described as impaired   Attention span & concentration ability appears normal.  Speech is absent - ASL is used ,  Mood and affect are appropriate.  Cranial nerves: Pupils are equal and briskly  reactive to light. Funduscopic exam without  evidence of pallor or edema. Extraocular movements  in vertical and horizontal planes intact and without nystagmus. Visual fields by finger perimetry are intact.Facial sensation intact to fine touch.  Facial motor strength is symmetric and tongue and uvula move midline. Shoulder shrug was symmetrical.   Motor exam: Normal tone, muscle bulk and symmetric strength in all extremities. Sensory:  Fine touch, pinprick and vibration were tested in all extremities. Proprioception tested in the upper extremities was normal Deep tendon reflexes: in the  upper and lower extremities are  symmetric and intact. Babinski maneuver response is  downgoing.  The patient was advised of the nature of the diagnosed sleep disorder , the treatment options and risks for general a health and wellness arising from not treating the condition.  I spent more than 45  minutes of face to face time with the patient.  ASL translator present. Greater than 50% of time was spent in counseling and coordination of care. We have discussed the diagnosis and differential and I answered the patient's questions.     Assessment:  After physical and neurologic examination, review of laboratory studies,  Personal review of imaging studies, reports of other /same  Imaging studies ,  Results of polysomnography/ neurophysiology testing and pre-existing records as far as provided in visit., my assessment is   1) has has some significant risk factors that would lead to the assumption that she has obstructive sleep apnea, mainly there has been witnessed loud snoring, a body mass index is considered obese at BMI 45, and she has woken herself from snoring and apnea. This may overall have reduced her sleep time at night, and therefore has not allowed her restorative and refreshing sleep quality either. For this reason I will order a split night polysomnography. I would like an ASL translator to be present. Should  her insurance not allow an in lab attended sleep study I would order a home sleep test instead, but I consider this a poor option as it will not provide CO2 and oxygen data neither the necessary EEG correlation to her sleep stages.    Plan:  Treatment plan and additional workup :  SPLIT night PSG with whole night ASL translator.  RV after sleep study, needed prior to the gastric Bypass surgery . No date set yet.    Asencion Partridge Anessa Charley MD  04/05/2016   CC: Dr. Andrey Spearman .     Denny Levy, [place] [address] [place], [place] [address]

## 2016-04-05 NOTE — Patient Instructions (Signed)

## 2016-04-06 ENCOUNTER — Telehealth: Payer: Self-pay | Admitting: Neurology

## 2016-04-06 DIAGNOSIS — G4710 Hypersomnia, unspecified: Secondary | ICD-10-CM

## 2016-04-06 DIAGNOSIS — R0683 Snoring: Secondary | ICD-10-CM

## 2016-04-06 DIAGNOSIS — G4730 Sleep apnea, unspecified: Secondary | ICD-10-CM

## 2016-04-06 NOTE — Telephone Encounter (Signed)
Patient wants to schedule for her sleep test.  Can I get an order for her sleep test?

## 2016-04-06 NOTE — Telephone Encounter (Signed)
The order is placed. Please note that the pt will need an ASL translator for whole night, per Dr. Edwena Felty notes.

## 2016-04-18 ENCOUNTER — Ambulatory Visit (INDEPENDENT_AMBULATORY_CARE_PROVIDER_SITE_OTHER): Payer: Medicaid Other | Admitting: Neurology

## 2016-04-18 DIAGNOSIS — G4710 Hypersomnia, unspecified: Secondary | ICD-10-CM | POA: Diagnosis not present

## 2016-04-18 DIAGNOSIS — G4730 Sleep apnea, unspecified: Secondary | ICD-10-CM | POA: Diagnosis not present

## 2016-04-18 DIAGNOSIS — R0683 Snoring: Secondary | ICD-10-CM

## 2016-04-28 ENCOUNTER — Telehealth: Payer: Self-pay

## 2016-04-28 NOTE — Telephone Encounter (Signed)
Pt returned my call, a sign language interpreter was involved. I advised pt that her study does not reveal significant sleep apnea or significant periodic limb movements of sleep resulting in significant sleep disruption. There was no evidence of hypoxemia and no abnormalities in EEG or EKG. Snoring was noted and treatment is optional. Treatment for snoring would be with a dental device. Pt declined treatment for snoring. I advised pt to avoid sedative-hypnotics which may worsen sleep apnea, alcohol and tobacco. I advised pt to lose weight, diet, and exercise if not contraindicated by her other physicians. Pt says that she will be undergoing gastric bypass surgery soon. I advised pt to avoid caffeine containing beverages and chocolate. I advised pt that an appt in the sleep clinic can be scheduled. Pt declined a follow up with Dr. Brett Fairy and says that she will follow up with Dr. Leta Baptist as scheduled. Pt asked that I fax a copy of these results to her bariatric surgeon, Dr. Evorn Gong. Pt verbalized understanding of results. Pt had no questions at this time but was encouraged to call back if questions arise.

## 2016-04-28 NOTE — Telephone Encounter (Signed)
I called pt's mobile number, no answer, left a message asking her to call me back.  I called pt's home number, interpreter line left a message asking me to call me back. Interpreter [phone].

## 2016-07-05 HISTORY — PX: GASTRIC BYPASS: SHX52

## 2016-08-30 ENCOUNTER — Telehealth: Payer: Self-pay | Admitting: Diagnostic Neuroimaging

## 2016-08-30 NOTE — Telephone Encounter (Signed)
Pt called to advise she rec'd a form from [facility] that has to be submitted by [id number] or her license will be revoked. She said it is regarding her seizures. Does she need an appt for this? If so Dr Mamie Nick is booked into January.

## 2016-08-30 NOTE — Telephone Encounter (Signed)
Spoke with patient  and advised she does not need an appointment to have papers filled out. Advised she just needs to bring papers into office, and after being completed she will be called to pick them up. She stated her PCP has papers at this time, and she'll call him and pick up papers to bring to this office. She verbalized understanding, appreciation.

## 2016-08-31 DIAGNOSIS — Z0289 Encounter for other administrative examinations: Secondary | ICD-10-CM

## 2016-09-07 NOTE — Telephone Encounter (Signed)
DMV papers on Dr [employer] desk for review, signature. Dr Leta Baptist is out of office today, returns tomorrow.

## 2016-09-08 NOTE — Telephone Encounter (Signed)
DMV papers completed, signed, sent to MR for processing. 

## 2016-09-13 ENCOUNTER — Telehealth: Payer: Self-pay | Admitting: *Deleted

## 2016-09-13 NOTE — Telephone Encounter (Signed)
Pt DMV form Faxed [phone].

## 2017-01-24 ENCOUNTER — Encounter: Payer: Self-pay | Admitting: Diagnostic Neuroimaging

## 2017-02-11 ENCOUNTER — Ambulatory Visit: Payer: Medicaid Other | Admitting: Diagnostic Neuroimaging

## 2017-03-18 ENCOUNTER — Other Ambulatory Visit: Payer: Self-pay | Admitting: Diagnostic Neuroimaging

## 2017-05-10 ENCOUNTER — Encounter: Payer: Self-pay | Admitting: Diagnostic Neuroimaging

## 2017-05-10 ENCOUNTER — Ambulatory Visit (INDEPENDENT_AMBULATORY_CARE_PROVIDER_SITE_OTHER): Payer: Medicaid Other | Admitting: Diagnostic Neuroimaging

## 2017-05-10 VITALS — BP 128/85 | HR 65 | Resp 16 | Ht 67.0 in | Wt 197.0 lb

## 2017-05-10 DIAGNOSIS — G40309 Generalized idiopathic epilepsy and epileptic syndromes, not intractable, without status epilepticus: Secondary | ICD-10-CM

## 2017-05-10 MED ORDER — LEVETIRACETAM 1000 MG PO TABS: 1000.0000 mg | ORAL_TABLET | Freq: Two times a day (BID) | ORAL | 4 refills | Status: DC

## 2017-05-10 NOTE — Patient Instructions (Addendum)
-   continue levetiracetam 1000mg  twice a day  - check CT head  - check EEG  - follow up at [email or web address] regarding seizure service dog agencies  - no driving until seizure free for at least 3 months (shortened time due to provoked seizure from alcohol and missed medications)

## 2017-05-10 NOTE — Progress Notes (Signed)
PATIENT: Mercedes Parks DOB: 1981/04/22  REASON FOR VISIT: routine follow up for seizures HISTORY FROM: patient, partner (via ASL interpreter); [facility] ER records requested and reviewed  Chief Complaint  Patient presents with  . Seizures    Last sz. 04/25/17.  Seen at [facility] ER. No med changes. Prior to the sz., she missed pm dose of Keppra for at least a month.  Also sts. was under more stress, and had been drinking ETOH.  Since 04/25/17, she stopped all ETOH and is compliant with Keppra.  No further sz. activity.  Had gastric bypass surgery and has lost 112lbs/fim    HISTORY OF PRESENT ILLNESS:  UPDATE 05/10/17: Since last visit, was doing well, and had gastric bypass surgery (Oct 2017). Then on 04/25/17, was drinking etoh (~20 oz, red's applehad not done so in a while), missed evening keppra doses in July 2018. Then had multiple breakthrough seizures outside (2-3 minutes each; 4-5 sz), then came inside with help of friends, and continued to have more seizures (another 45-60 minutes)> EMS arrived on scene. They were able to "stop" a few seizures with sternal rub. EMT raised possibility of pseudoseizures. Patient went to ER and was observed few hours and then d/c home. Records reviewed. Urine preg test neg. Labs neg. Patient was confused, tired, sore. She thinks she hit her head.   UPDATE 02/11/16: Since last visit, doing well. No seizures. Had a strange dream 2-3 weeks ago, that felt like possible seizure, then she woke up gasping for breath. Denies snoring or witnessed apnea, but lives alone. Has some daytime sleepiness even after full nights sleep.  UPDATE 02/03/15 (VRP): Since last visit, doing well. No seizures. Tolerating medications. Some more anxiety, now on cymbalta, and feels better.  UPDATE 08/05/14 (LL): Since last visit, patient has been well, no seizures. She is assisted by sign-language interpreter today. Tolerating Levitiracetam 1000 mg BID well.  Has been having  some increased blurred vision and occasional diplopia, recent eye exam was normal with the exception of a change on her eyeglass Rx. She is worried about memory loss, having trouble with remembering appointments and things that she and her son have scheduled. Has lost 40 lb. In the last year.   UPDATE 01/30/14 (LL):  [name] comes in for routine follow up, no seizures since last visit.  She had total hysterectomy in December due to endometriosis.  Tolerating Levitiracetam 1000 mg BID with only occasional dizziness.  She reports trouble staying asleep, wakes usually 2-3 times. Doing very well.  Asks about "vaping" and if it may trigger seizure.  UPDATE 07/31/13: On 07/27/13 patient was at a [place] party, had several alcoholic drinks, was exposed to secondhand marijuana smoke, and had multiple back-to-back seizures. Apparently she had several minutes of convulsions followed by several minutes of remission. Seizures started and stopped multiple times. Ambulance was called and arrived to pick her up within 45 minutes of onset of symptoms. The symptoms continued while patient was at the hospital. She was given benzodiazepine in the ambulance. She was given 1000 mg Keppra IV at the hospital. She was continued on the same dose of antiseizure medication (levetiracetam 500 mg twice a day). Patient tells that she has been missing some doses of antiseizure medication. She misses 4-8 tablets per month. This usually happens when she falls asleep after school and misses her dose.   UPDATE 11/20/12: She is doing very well, there was no recurrent seizure, she is tolerating Keppra 500 mg twice  a day.    UPDATE 02/02/12: No sz. More balance troubles, weight gain, decr energy, and fatigue. Drinks lots of "energy drinks". Having trouble with stairs (trips and falls).   UPDATE 07/06/11: Doing well. No sz. Back to driving. Now with endometriosis and planning to start lupron shots, but concerned about warning on package about  epilepsy.    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 He is accompanied by her mother, father and sign language interpreter.  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 Age 36 yrs - cochlear implants. Oct 2011 - dx'd with endometriosis; treated with tramadol; then had an episode of lightheadedness, out of body sensation, then passed out; reportedly had convulsions and tongue biting, no incontinence; then slept for 2 hours. Feb 2012 - exposure to synthetic marijuana, then had similar episode of seizure. March 2012 - several episodes of night time awakening, "weird breathing", not knowing if she is awake or dreaming; EEG done at Dr. 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  April 2012 - started on LEV 500mg  BID    REVIEW OF SYSTEMS: Full 14 system review of systems performed and negative except: deprssion anxiety dizziness headache seizure light sens double vision    ALLERGIES: Allergies  Allergen Reactions  . Amoxicillin Hives  . Hydrocodone Nausea And Vomiting  . Nsaids     Gastric Bypass Surgery  . Penicillins Hives    Has patient had a PCN reaction causing immediate rash, facial/tongue/throat swelling, SOB or lightheadedness with hypotension: Yes Has patient had a PCN reaction causing severe rash involving mucus membranes or skin necrosis: No Has patient had a PCN reaction that required hospitalization No Has patient had a PCN reaction occurring within the last 10 years: Non If all of the above answers are "NO", then may proceed with Cephalosporin use.   Mercedes Parks Antibiotics Hives    HOME MEDICATIONS: Outpatient Medications Prior to Visit  Medication Sig Dispense Refill  . estradiol (ESTRACE) 2 MG tablet Take 2 mg by mouth daily.    Marland Kitchen levETIRAcetam (KEPPRA) 1000 MG tablet TAKE 1 TABLET (1,000 MG TOTAL) BY MOUTH 2 (TWO) TIMES DAILY.  180 tablet 3  . leuprolide (LUPRON DEPOT, 79-MONTH,) 22.5 MG injection     . naproxen (NAPROSYN) 500 MG tablet 500 mg. As needed for endometriosis  0  . norethindrone (AYGESTIN) 5 MG tablet Take by mouth.     No facility-administered medications prior to visit.     PHYSICAL EXAM Vitals:   05/10/17 0801  BP: 128/85  Pulse: 65  Resp: 16  Weight: 197 lb (89.4 kg)  Height: 5\' 7"  (1.702 m)   Wt Readings from Last 3 Encounters:  05/10/17 197 lb (89.4 kg)  04/05/16 286 lb (129.7 kg)  02/11/16 (!) 312 lb (141.5 kg)   Body mass index is 30.85 kg/m.  GENERAL EXAM: Patient is in no distress; well developed, nourished and groomed; neck is supple  CARDIOVASCULAR: Regular rate and rhythm, no murmurs, no carotid bruits  NEUROLOGIC: MENTAL STATUS: awake, alert, language fluent, comprehension intact; DEAF, USES SIGN LANGUAGE CRANIAL NERVE: pupils equal and reactive to light, visual fields full to confrontation, extraocular muscles intact, no nystagmus, facial sensation and strength symmetric, hearing intact, palate elevates symmetrically, uvula midline, shoulder shrug symmetric, tongue midline. MOTOR: normal bulk and tone, full strength in the BUE, BLE SENSORY: normal and symmetric to light touch COORDINATION: finger-nose-finger, fine  finger movements normal REFLEXES: deep tendon reflexes present and symmetric GAIT/STATION: narrow based gait     DIAGNOSTICS/IMAGING:  08/09/14 CT head - nothing acute   ASSESSMENT: 36 y.o. with multiple episodes of loss of consciousness, convulsions, tongue biting, also with out of body experience and aura prior to the spells. 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   Last seizure 07/27/13 and then 04/25/17 (in setting of ETOH and missed medication doses).    Dx:  Generalized idiopathic epilepsy, not intractable, without status epilepticus ([place]) -  Plan: CT HEAD WO CONTRAST, EEG adult    PLAN:    - continue LEV 1000mg  twice a day - check CT head - check EEG - gave information about seizure service dog agencies - no driving until seizure free for at least 3 months (shortened time due to provoked nature of seizures from alcohol and missed medications)  Orders Placed This Encounter  Procedures  . CT HEAD WO CONTRAST  . EEG adult   Meds ordered this encounter  Medications  . levETIRAcetam (KEPPRA) 1000 MG tablet    Sig: Take 1 tablet (1,000 mg total) by mouth 2 (two) times daily.    Dispense:  180 tablet    Refill:  4   Return in about 3 months (around 08/10/2017).  I reviewed images, labs, notes, records myself. I summarized findings and reviewed with patient, for this high risk condition (breakthrough seizures) requiring high complexity decision making.    Penni Bombard, MD 7/71/1657, 9:03 AM Certified in Neurology, Neurophysiology and Neuroimaging  [facility] [address], [place], [place] [address] [phone]

## 2017-05-17 ENCOUNTER — Telehealth: Payer: Self-pay | Admitting: Diagnostic Neuroimaging

## 2017-05-17 NOTE — Telephone Encounter (Signed)
I called for approval of CT. Patient not able to have MRI due to cochlear implant.   Approval #[id number]   Penni Bombard, MD 5/68/6168, 37:29 AM Certified in Neurology, Neurophysiology and [facility] [address] [place], [place] [address] [phone]

## 2017-05-17 NOTE — Telephone Encounter (Signed)
Dorian Pod with [facility] just informed me that Medicaid did not approve the CT head. The phone number for the peer to peer is [phone] and the case number is [id number]. She is scheduled to have the exam done on Thursday 05/19/17.

## 2017-05-17 NOTE — Telephone Encounter (Signed)
Noted,  Thank you!

## 2017-05-18 ENCOUNTER — Ambulatory Visit (INDEPENDENT_AMBULATORY_CARE_PROVIDER_SITE_OTHER): Payer: Medicaid Other | Admitting: Diagnostic Neuroimaging

## 2017-05-18 DIAGNOSIS — G40309 Generalized idiopathic epilepsy and epileptic syndromes, not intractable, without status epilepticus: Secondary | ICD-10-CM

## 2017-05-19 ENCOUNTER — Ambulatory Visit
Admission: RE | Admit: 2017-05-19 | Discharge: 2017-05-19 | Disposition: A | Discharge status: Home / Self Care | Payer: Medicaid Other | Source: Ambulatory Visit | Attending: Diagnostic Neuroimaging | Admitting: Diagnostic Neuroimaging

## 2017-05-19 DIAGNOSIS — G40309 Generalized idiopathic epilepsy and epileptic syndromes, not intractable, without status epilepticus: Secondary | ICD-10-CM

## 2017-05-23 ENCOUNTER — Telehealth: Payer: Self-pay | Admitting: *Deleted

## 2017-05-23 NOTE — Telephone Encounter (Signed)
Reached patient's voice mail. Deaf interpreter [phone] was on the line. He stated may leave a VM. Left vm informing patient her CT head scan was unremarkable, no major findings, and unchanged from CT on 12/03/10. Advised her that when her EEG results are available she will get a call with results. Left number for any questions.  Interpreter stated the message went through.

## 2017-05-24 NOTE — Telephone Encounter (Signed)
Mercedes Parks #[id number] [place] interpreter called the pt was inquiring about EEG results. I relayed msg that EEG results are not available at this time but she will receive a call after they have been read. Interpreter stated the message went thru. FYI

## 2017-05-27 NOTE — Procedures (Signed)
   [facility]  EEG (ELECTROENCEPHALOGRAM) REPORT   STUDY DATE: 05/18/17 PATIENT NAME: Mercedes Parks DOB: 1981-01-29 MRN: [id number]  ORDERING CLINICIAN: Andrey Spearman, MD   TECHNOLOGIST: Laretta Alstrom  TECHNIQUE: Electroencephalogram was recorded utilizing standard 10-20 system of lead placement and reformatted into average and bipolar montages.  RECORDING TIME: 22 minutes  ACTIVATION: hyperventilation and photic stimulation  CLINICAL INFORMATION: 36 year old female with seizure disorder. Evaluate for seizure versus pseudoseizure.  FINDINGS: Posterior dominant background rhythms, which attenuate with eye opening, ranging 10-11 hertz and 40-50 microvolts. No focal, lateralizing, epileptiform activity or seizures are seen. Patient recorded in the awake and drowsy state. EKG channel shows regular rhythm of 50-60 beats per minute.   IMPRESSION:  Normal EEG in the awake and drowsy states.    INTERPRETING PHYSICIAN:  Penni Bombard, MD Certified in Neurology, Neurophysiology and Neuroimaging  [facility] [address], [place], [place] [address] [phone]

## 2017-05-31 ENCOUNTER — Telehealth: Payer: Self-pay | Admitting: *Deleted

## 2017-05-31 NOTE — Telephone Encounter (Signed)
Called patient through Interpreter service for deaf, [phone]. Patient did not answer; LVM informing patient that her EEG was normal. Reminded her of FU in Nov, left office number for any questions.

## 2017-07-26 ENCOUNTER — Ambulatory Visit (INDEPENDENT_AMBULATORY_CARE_PROVIDER_SITE_OTHER): Payer: Medicaid Other | Admitting: Podiatry

## 2017-07-26 ENCOUNTER — Ambulatory Visit (INDEPENDENT_AMBULATORY_CARE_PROVIDER_SITE_OTHER): Payer: Medicaid Other

## 2017-07-26 ENCOUNTER — Telehealth: Payer: Self-pay | Admitting: *Deleted

## 2017-07-26 VITALS — BP 91/60 | HR 70 | Ht 67.0 in | Wt 192.0 lb

## 2017-07-26 DIAGNOSIS — S93492A Sprain of other ligament of left ankle, initial encounter: Secondary | ICD-10-CM

## 2017-07-26 DIAGNOSIS — M779 Enthesopathy, unspecified: Secondary | ICD-10-CM

## 2017-07-26 DIAGNOSIS — M7751 Other enthesopathy of right foot: Secondary | ICD-10-CM

## 2017-07-26 NOTE — Telephone Encounter (Signed)
-----   Message from Rip Harbour, [facility] sent at 07/26/2017 10:34 AM EDT ----- Regarding: CT CT ankle left - Sprain of anterior talofibular ligament of left ankle vs tear left   Patient has cochlear implant - no MRI

## 2017-07-26 NOTE — Progress Notes (Signed)
   Subjective:    Patient ID: Mercedes Parks, female    DOB: 01/01/1981, 36 y.o.   MRN: [id number]  HPI  Chief Complaint  Patient presents with  . Foot Injury    July 2018, sprained left ankle  . Foot Pain    left Ankle/foot pain. Feels very tight, stiff. Wakes her from sleep. Received IM steroid injection from PCP with no relief.       Review of Systems  All other systems reviewed and are negative.      Objective:   Physical Exam: Pulses are palpable. Neurologic sensorium is intact. She has pain on palpati overlying the anterior talofibular ligament Radiographs taken do not demonstrate any type of osseus abnormalities.        Assessment & Plan:  Probable tear of the anterior talofibular ligamentleft foot.  Plan: Place her compression anklet and a Tri-Lock brace recommended MRI of the left ankle for surgical consideration.

## 2017-07-29 NOTE — Telephone Encounter (Addendum)
[employer] requires clinicals for review prior to authorization.

## 2017-08-01 NOTE — Telephone Encounter (Signed)
Yes

## 2017-08-01 NOTE — Telephone Encounter (Signed)
Faxed [employer] case summary, clinicals and demographics.

## 2017-08-12 ENCOUNTER — Ambulatory Visit: Payer: Medicaid Other | Admitting: Diagnostic Neuroimaging

## 2017-08-12 ENCOUNTER — Encounter: Payer: Self-pay | Admitting: Diagnostic Neuroimaging

## 2017-08-12 VITALS — BP 107/74 | HR 64 | Ht 67.0 in | Wt 198.2 lb

## 2017-08-12 DIAGNOSIS — H903 Sensorineural hearing loss, bilateral: Secondary | ICD-10-CM | POA: Diagnosis not present

## 2017-08-12 DIAGNOSIS — G40309 Generalized idiopathic epilepsy and epileptic syndromes, not intractable, without status epilepticus: Secondary | ICD-10-CM | POA: Diagnosis not present

## 2017-08-12 MED ORDER — LEVETIRACETAM 1000 MG PO TABS: 1000.0000 mg | ORAL_TABLET | Freq: Two times a day (BID) | ORAL | 4 refills | Status: DC

## 2017-08-12 NOTE — Progress Notes (Signed)
PATIENT: Mercedes Parks DOB: 1981-07-21  REASON FOR VISIT: routine follow up for seizures HISTORY FROM: patient, partner (via ASL interpreter); [facility] ER records requested and reviewed  Chief Complaint  Patient presents with  . Follow-up    Pt is deaf, has [place] interpreter  . Seizures    doing well no sz    HISTORY OF PRESENT ILLNESS:  UPDATE (08/12/17, VRP): Since last visit, doing well. Tolerating meds. No alleviating or aggravating factors. No more seizures. Does note some vivid dreams (wakes up and feels like something has touched her)  UPDATE 05/10/17: Since last visit, was doing well, and had gastric bypass surgery (Oct 2017). Then on 04/25/17, was drinking etoh (~20 oz, red's applehad not done so in a while), missed evening keppra doses in July 2018. Then had multiple breakthrough seizures outside (2-3 minutes each; 4-5 sz), then came inside with help of friends, and continued to have more seizures (another 45-60 minutes)> EMS arrived on scene. They were able to "stop" a few seizures with sternal rub. EMT raised possibility of pseudoseizures. Patient went to ER and was observed few hours and then d/c home. Records reviewed. Urine preg test neg. Labs neg. Patient was confused, tired, sore. She thinks she hit her head.   UPDATE 02/11/16: Since last visit, doing well. No seizures. Had a strange dream 2-3 weeks ago, that felt like possible seizure, then she woke up gasping for breath. Denies snoring or witnessed apnea, but lives alone. Has some daytime sleepiness even after full nights sleep.  UPDATE 02/03/15 (VRP): Since last visit, doing well. No seizures. Tolerating medications. Some more anxiety, now on cymbalta, and feels better.  UPDATE 08/05/14 (LL): Since last visit, patient has been well, no seizures. She is assisted by sign-language interpreter today. Tolerating Levitiracetam 1000 mg BID well.  Has been having some increased blurred vision and occasional diplopia, recent eye exam  was normal with the exception of a change on her eyeglass Rx. She is worried about memory loss, having trouble with remembering appointments and things that she and her son have scheduled. Has lost 40 lb. In the last year.   UPDATE 01/30/14 (LL):  [name] comes in for routine follow up, no seizures since last visit.  She had total hysterectomy in December due to endometriosis.  Tolerating Levitiracetam 1000 mg BID with only occasional dizziness.  She reports trouble staying asleep, wakes usually 2-3 times. Doing very well.  Asks about "vaping" and if it may trigger seizure.  UPDATE 07/31/13: On 07/27/13 patient was at a [place] party, had several alcoholic drinks, was exposed to secondhand marijuana smoke, and had multiple back-to-back seizures. Apparently she had several minutes of convulsions followed by several minutes of remission. Seizures started and stopped multiple times. Ambulance was called and arrived to pick her up within 45 minutes of onset of symptoms. The symptoms continued while patient was at the hospital. She was given benzodiazepine in the ambulance. She was given 1000 mg Keppra IV at the hospital. She was continued on the same dose of antiseizure medication (levetiracetam 500 mg twice a day). Patient tells that she has been missing some doses of antiseizure medication. She misses 4-8 tablets per month. This usually happens when she falls asleep after school and misses her dose.   UPDATE 11/20/12: She is doing very well, there was no recurrent seizure, she is tolerating Keppra 500 mg twice a day.    UPDATE 02/02/12: No sz. More balance troubles, weight gain, decr energy, and  fatigue. Drinks lots of "energy drinks". Having trouble with stairs (trips and falls).   UPDATE 07/06/11: Doing well. No sz. Back to driving. Now with endometriosis and planning to start lupron shots, but concerned about warning on package about epilepsy.    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 He is accompanied by her mother, father and sign language interpreter.  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 Age 36 yrs - cochlear implants. Oct 2011 - dx'd with endometriosis; treated with tramadol; then had an episode of lightheadedness, out of body sensation, then passed out; reportedly had convulsions and tongue biting, no incontinence; then slept for 2 hours. Feb 2012 - exposure to synthetic marijuana, then had similar episode of seizure. March 2012 - several episodes of night time awakening, "weird breathing", not knowing if she is awake or dreaming; EEG done at Dr. 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  April 2012 - started on LEV 500mg  BID    REVIEW OF SYSTEMS: Full 14 system review of systems performed and negative except: depression anxiety dizziness headache seizure light sens double vision    ALLERGIES: Allergies  Allergen Reactions  . Amoxicillin Hives  . Hydrocodone Nausea And Vomiting  . Nsaids     Gastric Bypass Surgery  . Penicillins Hives    Has patient had a PCN reaction causing immediate rash, facial/tongue/throat swelling, SOB or lightheadedness with hypotension: Yes Has patient had a PCN reaction causing severe rash involving mucus membranes or skin necrosis: No Has patient had a PCN reaction that required hospitalization No Has patient had a PCN reaction occurring within the last 10 years: Non If all of the above answers are "NO", then may proceed with Cephalosporin use.   Ignacia Bayley Antibiotics Hives    HOME MEDICATIONS: Outpatient Medications Prior to Visit  Medication Sig Dispense Refill  . estradiol (ESTRACE) 2 MG tablet Take 2 mg by mouth daily.    Marland Kitchen FLUoxetine (PROZAC) 20 MG tablet Take 20 mg by mouth daily.    Marland Kitchen levETIRAcetam (KEPPRA) 1000 MG tablet Take 1 tablet (1,000 mg total) by mouth 2 (two) times daily. 180  tablet 4  . Multiple Vitamins-Minerals (THERA-M) TABS Take by mouth.    . norethindrone (AYGESTIN) 5 MG tablet Take by mouth.    Marland Kitchen omeprazole (PRILOSEC) 20 MG capsule TAKE ONE THE NIGHT BEFORE SUGERY THEN ONE TAB TWICE A DAY.    Marland Kitchen polycarbophil (FIBERCON) 625 MG tablet Take 625 mg by mouth daily.    . valACYclovir (VALTREX) 500 MG tablet Take by mouth.    Marland Kitchen leuprolide (LUPRON DEPOT, 75-MONTH,) 22.5 MG injection     . naproxen (NAPROSYN) 500 MG tablet 500 mg. As needed for endometriosis  0  . VITAMIN D, CHOLECALCIFEROL, PO Take by mouth.     No facility-administered medications prior to visit.     PHYSICAL EXAM Vitals:   08/12/17 0833  BP: 107/74  Pulse: 64  Weight: 198 lb 3.2 oz (89.9 kg)  Height: 5\' 7"  (1.702 m)   Wt Readings from Last 3 Encounters:  08/12/17 198 lb 3.2 oz (89.9 kg)  07/26/17 192 lb (87.1 kg)  05/10/17 197 lb (89.4 kg)   Body mass index is 31.04 kg/m.  GENERAL EXAM: Patient is in no distress; well developed, nourished and groomed; neck is supple  CARDIOVASCULAR: Regular rate and rhythm, no murmurs, no carotid bruits  NEUROLOGIC:  MENTAL STATUS: awake, alert, language fluent, comprehension intact; DEAF, USES SIGN LANGUAGE CRANIAL NERVE: pupils equal and reactive to light, visual fields full to confrontation, extraocular muscles intact, no nystagmus, facial sensation and strength symmetric, hearing intact, palate elevates symmetrically, uvula midline, shoulder shrug symmetric, tongue midline. MOTOR: normal bulk and tone, full strength in the BUE, BLE SENSORY: normal and symmetric to light touch COORDINATION: finger-nose-finger, fine finger movements normal REFLEXES: deep tendon reflexes present and symmetric GAIT/STATION: narrow based gait     DIAGNOSTICS/IMAGING:  08/09/14 CT head - nothing acute  05/19/17 CT head  - Unremarkable. No acute findings.   05/18/17 EEG  - normal   ASSESSMENT: 36 y.o. with multiple episodes of loss of consciousness,  convulsions, tongue biting, also with out of body experience and aura prior to the spells. 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   Last seizure 07/27/13 and then 04/25/17 (in setting of ETOH and missed medication doses).    Dx:  Generalized idiopathic epilepsy, not intractable, without status epilepticus ([place])  Hearing loss sensory, bilateral    PLAN:    I spent 15 minutes of face to face time with patient. Greater than 50% of time was spent in counseling and coordination of care with patient. In summary we discussed:   - continue LEV 1000mg  twice a day - ok to return to driving  Meds ordered this encounter  Medications  . levETIRAcetam (KEPPRA) 1000 MG tablet    Sig: Take 1 tablet (1,000 mg total) 2 (two) times daily by mouth.    Dispense:  180 tablet    Refill:  4   Return in about 9 months (around 05/12/2018).    Penni Bombard, MD 86/57/8469, 6:29 AM Certified in Neurology, Neurophysiology and Neuroimaging  [facility] [address] [place], [place] [address] [phone]

## 2017-08-16 NOTE — Telephone Encounter (Signed)
Pt called for status of CT pre-cert. 08/16/2017-I informed pt her insurance had approved the CT and she could schedule with [facility] [phone]. [place] MEDICAID APPROVED CT [id number] LEFT Mercedes Parks [name], VALID 07/29/2017 EXPIRES 08/28/2017.

## 2017-08-24 ENCOUNTER — Ambulatory Visit
Admission: RE | Admit: 2017-08-24 | Discharge: 2017-08-24 | Disposition: A | Discharge status: Home / Self Care | Payer: Medicaid Other | Source: Ambulatory Visit | Attending: Podiatry | Admitting: Podiatry

## 2017-08-26 ENCOUNTER — Telehealth: Payer: Self-pay | Admitting: *Deleted

## 2017-08-26 NOTE — Telephone Encounter (Addendum)
Ordered copy of CT disc [facility]. I informed pt of Dr. Stephenie Acres review of results and request to send copy of CT for more in-depth review by a radiology specialist ofr treatment planning, there would be a 7-10 day delay and we would call with instructions when final results returned. Pt states understanding. Mailed copy of CT disc to [facility].

## 2017-08-26 NOTE — Telephone Encounter (Signed)
-----   Message from Garrel Ridgel, [place] sent at 08/24/2017  5:14 PM EST ----- Send for an over read.

## 2017-08-30 ENCOUNTER — Telehealth: Payer: Self-pay

## 2017-08-30 NOTE — Telephone Encounter (Signed)
LVM for patient to return my call to inform her that Dr Milinda Pointer is requesting an over-read and to inform patient of delay

## 2017-08-31 ENCOUNTER — Encounter: Payer: Self-pay | Admitting: Podiatry

## 2017-08-31 ENCOUNTER — Telehealth: Payer: Self-pay

## 2017-08-31 NOTE — Progress Notes (Signed)
CD of MRI from [facility] put up front to be mailed out to [place] services today, Wednesday 05 December. [place]

## 2017-08-31 NOTE — Telephone Encounter (Signed)
Spoke with patient in forming her of delay due to sending MRI to [facility] for over-read. CD was mailed

## 2017-09-09 ENCOUNTER — Encounter: Payer: Self-pay | Admitting: Podiatry

## 2017-10-04 ENCOUNTER — Encounter: Payer: Self-pay | Admitting: Podiatry

## 2017-10-04 ENCOUNTER — Ambulatory Visit: Payer: Medicaid Other | Admitting: Podiatry

## 2017-10-04 DIAGNOSIS — Q688 Other specified congenital musculoskeletal deformities: Secondary | ICD-10-CM

## 2017-10-04 DIAGNOSIS — M7752 Other enthesopathy of left foot: Secondary | ICD-10-CM | POA: Diagnosis not present

## 2017-10-04 NOTE — Progress Notes (Signed)
She presents today with her interpreter with for follow-up of her MRI regarding her left foot and ankle pain.  She states there is been no change.  Still has pain along the medial and lateral aspect of the subtalar joint area.  Objective: Vital signs are stable alert and oriented x3.  She has pain or sharp plantar flexion of the ankle with impingement of the os trigonum.  MRI does state os trigonum syndrome is a possibility here with no real findings of tendinopathy.  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  Plan: After sterile Betadine skin prep and verbal consent I injected today with Kenalog for 20 mg and 5 mg of Marcaine to the point of maximal tenderness.  She tolerated procedure well and will follow up with me in 3 weeks.

## 2017-10-12 HISTORY — PX: PANNICULECTOMY: SUR1001

## 2017-11-01 ENCOUNTER — Ambulatory Visit: Payer: Medicaid Other | Admitting: Podiatry

## 2017-11-01 DIAGNOSIS — Q688 Other specified congenital musculoskeletal deformities: Secondary | ICD-10-CM

## 2017-11-01 DIAGNOSIS — Q6689 Other  specified congenital deformities of feet: Secondary | ICD-10-CM

## 2017-11-01 NOTE — Progress Notes (Signed)
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  She states that after the last injection she had about 2 days with pain that was fleeting in nature.  Otherwise she states that she has been doing very well and very little pain since the initial week of the injection.  States that she has had a tummy tuck in the last few weeks and is no longer taking pain medication but still has no foot pain.  Objective: Vital signs are stable she is alert and oriented x3.  Pulses are palpable.  Neurologic sensorium is intact.  Deep tendon reflexes are intact she has no pain on palpation of the os trigonum she also has no pain on plantar flexion of the foot and the posterior aspect of the ankle and subtalar joint.  No erythema edema cellulitis drainage or odor.  Assessment: Well-healing os trigonum syndrome left.  Plan: Follow-up with me on an as needed basis.  She is very happy with the outcome.

## 2018-01-24 ENCOUNTER — Encounter: Payer: Self-pay | Admitting: Allergy and Immunology

## 2018-01-24 ENCOUNTER — Ambulatory Visit: Payer: Medicaid Other | Admitting: Allergy and Immunology

## 2018-01-24 VITALS — BP 108/60 | HR 72 | Temp 98.3°F | Resp 16 | Ht 66.0 in | Wt 204.0 lb

## 2018-01-24 DIAGNOSIS — J3089 Other allergic rhinitis: Secondary | ICD-10-CM

## 2018-01-24 DIAGNOSIS — J302 Other seasonal allergic rhinitis: Secondary | ICD-10-CM | POA: Insufficient documentation

## 2018-01-24 DIAGNOSIS — H1010 Acute atopic conjunctivitis, unspecified eye: Secondary | ICD-10-CM | POA: Insufficient documentation

## 2018-01-24 DIAGNOSIS — H1013 Acute atopic conjunctivitis, bilateral: Secondary | ICD-10-CM | POA: Diagnosis not present

## 2018-01-24 DIAGNOSIS — R059 Cough, unspecified: Secondary | ICD-10-CM | POA: Insufficient documentation

## 2018-01-24 DIAGNOSIS — L500 Allergic urticaria: Secondary | ICD-10-CM

## 2018-01-24 DIAGNOSIS — R05 Cough: Secondary | ICD-10-CM

## 2018-01-24 MED ORDER — FLUTICASONE PROPIONATE 50 MCG/ACT NA SUSP: 1.0000 | Freq: Two times a day (BID) | NASAL | 5 refills | Status: DC

## 2018-01-24 MED ORDER — OLOPATADINE HCL 0.7 % OP SOLN: 1.0000 [drp] | Freq: Every day | OPHTHALMIC | 5 refills | Status: DC

## 2018-01-24 MED ORDER — CARBINOXAMINE MALEATE ER 4 MG/5ML PO SUER: 6.0000 mg | Freq: Two times a day (BID) | ORAL | 5 refills | Status: DC

## 2018-01-24 NOTE — Assessment & Plan Note (Signed)
   Treatment plan as outlined above for allergic rhinitis.  A prescription has been provided for Pazeo, one drop per eye daily as needed.  I have also recommended eye lubricant drops (i.e., Natural Tears) as needed. 

## 2018-01-24 NOTE — Patient Instructions (Addendum)
Urticaria Unclear etiology. Skin tests to select food allergens were negative today. NSAIDs and emotional stress commonly exacerbate urticaria but are not the underlying etiology in this case. Physical urticarias are negative by history (i.e. pressure-induced, temperature, vibration, solar, etc.). History and lesions are not consistent with urticaria pigmentosa so I am not suspicious for mastocytosis. There are no concomitant symptoms concerning for anaphylaxis or constitutional symptoms worrisome for an underlying malignancy.   We will not order labs at this time, however, if lesions recur, persist, progress, or change in character, we will assess potential etiologies with screening labs.  For symptom relief, patient is to take oral antihistamines as directed.  A prescription has been provided for [facility] ER (carbinoxamine) 6-16 mg twice daily as needed.  Should symptoms recur, a journal is to be kept recording any foods eaten, beverages consumed, medications taken within a 6 hour period prior to the onset of symptoms, as well as record activities being performed, and environmental conditions. For any symptoms concerning for anaphylaxis, 911 is to be called immediately.  Seasonal and perennial allergic rhinitis  Aeroallergen avoidance measures have been discussed and provided in written form.  A prescription has been provided for [facility] ER (carbinoxamine) 6-16 mg twice daily as needed.  A prescription has been provided for fluticasone nasal spray, 1-2 sprays per nostril 2 times daily as needed. Proper nasal spray technique has been discussed and demonstrated.   Nasal saline spray (i.e., Simply Saline) or nasal saline lavage (i.e., NeilMed) is recommended as needed and prior to medicated nasal sprays.  If allergen avoidance measures and medications fail to adequately relieve symptoms, aeroallergen immunotherapy will be considered.  Allergic conjunctivitis  Treatment plan as outlined above for  allergic rhinitis.  A prescription has been provided for Pazeo, one drop per eye daily as needed.  I have also recommended eye lubricant drops (i.e., Natural Tears) as needed.  Coughing The patient's history and physical examination suggest upper airway cough syndrome.  Spirometry today reveals normal ventilatory function. We will treat postnasal drainage and evaluate results.  Treatment plan as outlined above.  If the coughing persists or progresses despite this plan, we will evaluate further.   Return in about 5 months (around 06/26/2018), or if symptoms worsen or fail to improve.  Reducing Pollen Exposure  The American Academy of Allergy, Asthma and Immunology suggests the following steps to reduce your exposure to pollen during allergy seasons.    1. Do not hang sheets or clothing out to dry; pollen may collect on these items. 2. Do not mow lawns or spend time around freshly cut grass; mowing stirs up pollen. 3. Keep windows closed at night.  Keep car windows closed while driving. 4. Minimize morning activities outdoors, a time when pollen counts are usually at their highest. 5. Stay indoors as much as possible when pollen counts or humidity is high and on windy days when pollen tends to remain in the air longer. 6. Use air conditioning when possible.  Many air conditioners have filters that trap the pollen spores. 7. Use a HEPA room air filter to remove pollen form the indoor air you breathe.   Control of Dog or Cat Allergen  Avoidance is the best way to manage a dog or cat allergy. If you have a dog or cat and are allergic to dog or cats, consider removing the dog or cat from the home. If you have a dog or cat but don't want to find it a new home, or if your  family wants a pet even though someone in the household is allergic, here are some strategies that may help keep symptoms at bay:  1. Keep the pet out of your bedroom and restrict it to only a few rooms. Be advised that  keeping the dog or cat in only one room will not limit the allergens to that room. 2. Don't pet, hug or kiss the dog or cat; if you do, wash your hands with soap and water. 3. High-efficiency particulate air (HEPA) cleaners run continuously in a bedroom or living room can reduce allergen levels over time. 4. Place electrostatic material sheet in the air inlet vent in the bedroom. 5. Regular use of a high-efficiency vacuum cleaner or a central vacuum can reduce allergen levels. 6. Giving your dog or cat a bath at least once a week can reduce airborne allergen.  Control of Mold Allergen  Mold and fungi can grow on a variety of surfaces provided certain temperature and moisture conditions exist.  Outdoor molds grow on plants, decaying vegetation and soil.  The major outdoor mold, Alternaria and Cladosporium, are found in very high numbers during hot and dry conditions.  Generally, a late Summer - Fall peak is seen for common outdoor fungal spores.  Rain will temporarily lower outdoor mold spore count, but counts rise rapidly when the rainy period ends.  The most important indoor molds are Aspergillus and Penicillium.  Dark, humid and poorly ventilated basements are ideal sites for mold growth.  The next most common sites of mold growth are the bathroom and the kitchen.  Outdoor [employer] 1. Use air conditioning and keep windows closed 2. Avoid exposure to decaying vegetation. 3. Avoid leaf raking. 4. Avoid grain handling. 5. Consider wearing a face mask if working in moldy areas.  Indoor Mold Control 1. Maintain humidity below 50%. 2. Clean washable surfaces with 5% bleach solution. 3. Remove sources e.g. Contaminated carpets.  Control of Cockroach Allergen  Cockroach allergen has been identified as an important cause of acute attacks of asthma, especially in urban settings.  There are fifty-five species of cockroach that exist in the [place], however only three, the [place], [place] species produce allergen that can affect patients with Asthma.  Allergens can be obtained from fecal particles, egg casings and secretions from cockroaches.    1. Remove food sources. 2. Reduce access to water. 3. Seal access and entry points. 4. Spray runways with 0.5-1% Diazinon or Chlorpyrifos 5. Blow boric acid power under stoves and refrigerator. 6. Place bait stations (hydramethylnon) at feeding sites.

## 2018-01-24 NOTE — Progress Notes (Signed)
New Patient Note  RE: Mercedes Parks MRN: [id number] DOB: [birth year] Date of Office Visit: 01/24/2018  Referring provider: Denny Levy, PA Primary care provider: Denny Levy, [place]  Chief Complaint: Urticaria and Allergic Rhinitis    History of present illness: Mercedes Parks is a 37 y.o. female seen today in consultation requested by Judd Lien, MD.  She is accompanied today by a sign language interpreter who assists with the history.  She complains of nasal congestion, rhinorrhea, sneezing, postnasal drainage, throat clearing, coughing, nasal pruritus, and ocular pruritus.  These symptoms are most frequent and severe during the springtime.  She attempts to control the symptoms with fexofenadine as needed.  The cough is described as originating as a tickle/irritation in the base of her throat.  She denies dyspnea, chest tightness, and wheezing. [name] reports that in January 2019 she underwent panniculectomy and in the week or so following surgery she developed some hives on her abdomen.  The hives lasted for several days.  She did not experience concomitant angioedema, cardiopulmonary symptoms, or GI symptoms.  No specific medication, food, skin care product, detergent, soap, or other environmental triggers were identified.  Assessment and plan: Urticaria Unclear etiology. Skin tests to select food allergens were negative today. NSAIDs and emotional stress commonly exacerbate urticaria but are not the underlying etiology in this case. Physical urticarias are negative by history (i.e. pressure-induced, temperature, vibration, solar, etc.). History and lesions are not consistent with urticaria pigmentosa so I am not suspicious for mastocytosis. There are no concomitant symptoms concerning for anaphylaxis or constitutional symptoms worrisome for an underlying malignancy.   We will not order labs at this time, however, if lesions recur, persist, progress, or change in  character, we will assess potential etiologies with screening labs.  For symptom relief, patient is to take oral antihistamines as directed.  A prescription has been provided for [facility] ER (carbinoxamine) 6-16 mg twice daily as needed.  Should symptoms recur, a journal is to be kept recording any foods eaten, beverages consumed, medications taken within a 6 hour period prior to the onset of symptoms, as well as record activities being performed, and environmental conditions. For any symptoms concerning for anaphylaxis, 911 is to be called immediately.  Seasonal and perennial allergic rhinitis  Aeroallergen avoidance measures have been discussed and provided in written form.  A prescription has been provided for [facility] ER (carbinoxamine) 6-16 mg twice daily as needed.  A prescription has been provided for fluticasone nasal spray, 1-2 sprays per nostril 2 times daily as needed. Proper nasal spray technique has been discussed and demonstrated.   Nasal saline spray (i.e., Simply Saline) or nasal saline lavage (i.e., NeilMed) is recommended as needed and prior to medicated nasal sprays.  If allergen avoidance measures and medications fail to adequately relieve symptoms, aeroallergen immunotherapy will be considered.  Allergic conjunctivitis  Treatment plan as outlined above for allergic rhinitis.  A prescription has been provided for Pazeo, one drop per eye daily as needed.  I have also recommended eye lubricant drops (i.e., Natural Tears) as needed.  Coughing The patient's history and physical examination suggest upper airway cough syndrome.  Spirometry today reveals normal ventilatory function. We will treat postnasal drainage and evaluate results.  Treatment plan as outlined above.  If the coughing persists or progresses despite this plan, we will evaluate further.   Meds ordered this encounter  Medications  . Carbinoxamine Maleate ER [facility] ER) 4 MG/5ML SUER    Sig: Take 6-10  mg  by mouth 2 (two) times daily.    Dispense:  1 Bottle    Refill:  5  . fluticasone (FLONASE) 50 MCG/ACT nasal spray    Sig: Place 1-2 sprays into both nostrils 2 (two) times daily.    Dispense:  16 g    Refill:  5  . Olopatadine HCl (PAZEO) 0.7 % SOLN    Sig: Place 1 drop into both eyes daily.    Dispense:  1 Bottle    Refill:  5    BRAND NAME ONLY.    Diagnostics: Spirometry: Normal with an FEV1 of 104% predicted.  Please see scanned spirometry results for details. Environmental skin testing: Positive to grass pollen, molds, cat hair, and cockroach antigen antigen. Food allergen skin testing: Negative despite a positive histamine control.    Physical examination: Blood pressure 108/60, pulse 72, temperature 98.3 F (36.8 C), temperature source Oral, resp. rate 16, height 5\' 6"  (1.676 m), weight 204 lb (92.5 kg), SpO2 96 %.  General: Alert, interactive, in no acute distress. HEENT: TMs pearly gray, turbinates moderately edematous with clear discharge, post-pharynx moderately erythematous. Neck: Supple without lymphadenopathy. Lungs: Clear to auscultation without wheezing, rhonchi or rales. CV: Normal S1, S2 without murmurs. Abdomen: Nondistended, nontender. Skin: Warm and dry, without lesions or rashes. Extremities:  No clubbing, cyanosis or edema. Neuro:   Grossly intact.  Review of systems:  Review of systems negative except as noted in HPI / PMHx or noted below: Review of Systems  Constitutional: Negative.   HENT: Negative.   Eyes: Negative.   Respiratory: Negative.   Cardiovascular: Negative.   Gastrointestinal: Negative.   Genitourinary: Negative.   Musculoskeletal: Negative.   Skin: Negative.   Neurological: Negative.   Endo/Heme/Allergies: Negative.   Psychiatric/Behavioral: Negative.     Past medical history:  Past Medical History:  Diagnosis Date  . 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 Seizure [facility])     Past surgical history:  Past  Surgical History:  Procedure Laterality Date  . ABDOMINAL HYSTERECTOMY  2011  . CESAREAN SECTION     @ age 84  . CHOLECYSTECTOMY    . COCHLEAR IMPLANT     @ age 39  . GASTRIC BYPASS  07/05/2016  . PANNICULECTOMY  10/12/2017   Dr. Kellie Simmering  . TONSILECTOMY, ADENOIDECTOMY, BILATERAL MYRINGOTOMY AND TUBES      Family history: Family History  Problem Relation Age of Onset  . Healthy Mother   . Healthy Father   . Colon cancer Maternal Grandfather   . Breast cancer Paternal Grandmother   . Seizures Neg Hx     Social history: Social History   Socioeconomic History  . Marital status: Single    Spouse name: Not on file  . Number of children: 1  . Years of education: College  . Highest education level: Not on file  Occupational History    Comment: n/a-unemployed  Social Needs  . Financial resource strain: Not on file  . Food insecurity:    Worry: Not on file    Inability: Not on file  . Transportation needs:    Medical: Not on file    Non-medical: Not on file  Tobacco Use  . Smoking status: Former Smoker    Types: Cigarettes    Last attempt to quit: 09/27/2012    Years since quitting: 5.3  . Smokeless tobacco: Never Used  Substance and Sexual Activity  . Alcohol use: No    Alcohol/week: 0.0 oz  Comment: quit 11/2010  . Drug use: No    Comment: marijuana; quit 3/ 2012  . Sexual activity: Not on file  Lifestyle  . Physical activity:    Days per week: Not on file    Minutes per session: Not on file  . Stress: Not on file  Relationships  . Social connections:    Talks on phone: Not on file    Gets together: Not on file    Attends religious service: Not on file    Active member of club or organization: Not on file    Attends meetings of clubs or organizations: Not on file    Relationship status: Not on file  . Intimate partner violence:    Fear of current or ex partner: Not on file    Emotionally abused: Not on file    Physically abused: Not on file    Forced  sexual activity: Not on file  Other Topics Concern  . Not on file  Social History Narrative   Patient is single with one child.   Patient is right handed.   Patient has hs education and is currently in college.   Patient does not drink any caffeine.   Environmental History: The patient lives in an older apartment with central air/heat.  There is no known mold/water damage department.  She is a non-smoker without pets.  Allergies as of 01/24/2018      Reactions   Amoxicillin Hives   Hydrocodone Nausea And Vomiting   Nsaids    Gastric Bypass Surgery   Penicillins Hives   Has patient had a PCN reaction causing immediate rash, facial/tongue/throat swelling, SOB or lightheadedness with hypotension: Yes Has patient had a PCN reaction causing severe rash involving mucus membranes or skin necrosis: No Has patient had a PCN reaction that required hospitalization No Has patient had a PCN reaction occurring within the last 10 years: Non If all of the above answers are "NO", then may proceed with Cephalosporin use.   Sulfa Antibiotics Hives      Medication List        Accurate as of 01/24/18  1:29 PM. Always use your most recent med list.          acetaminophen 500 MG tablet Commonly known as:  TYLENOL Take 500 mg by mouth every 6 (six) hours as needed.   Carbinoxamine Maleate ER 4 MG/5ML Suer Commonly known as:  KARBINAL ER Take 6-10 mg by mouth 2 (two) times daily.   estradiol 2 MG tablet Commonly known as:  ESTRACE Take 2 mg by mouth daily.   fluticasone 50 MCG/ACT nasal spray Commonly known as:  FLONASE Place 1-2 sprays into both nostrils 2 (two) times daily.   levETIRAcetam 1000 MG tablet Commonly known as:  KEPPRA Take 1 tablet (1,000 mg total) 2 (two) times daily by mouth.   norethindrone 5 MG tablet Commonly known as:  AYGESTIN Take by mouth.   Olopatadine HCl 0.7 % Soln Commonly known as:  PAZEO Place 1 drop into both eyes daily.   omeprazole 20 MG  capsule Commonly known as:  PRILOSEC TAKE ONE THE NIGHT BEFORE SUGERY THEN ONE TAB TWICE A DAY.   THERA-M Tabs Take by mouth.   valACYclovir 500 MG tablet Commonly known as:  VALTREX Take by mouth.       Known medication allergies: Allergies  Allergen Reactions  . Amoxicillin Hives  . Hydrocodone Nausea And Vomiting  . Nsaids     Gastric Bypass Surgery  .  Penicillins Hives    Has patient had a PCN reaction causing immediate rash, facial/tongue/throat swelling, SOB or lightheadedness with hypotension: Yes Has patient had a PCN reaction causing severe rash involving mucus membranes or skin necrosis: No Has patient had a PCN reaction that required hospitalization No Has patient had a PCN reaction occurring within the last 10 years: Non If all of the above answers are "NO", then may proceed with Cephalosporin use.   . Sulfa Antibiotics Hives    I appreciate the opportunity to take part in Sherol's care. Please do not hesitate to contact me with questions.  Sincerely,   R. Edgar Frisk, MD

## 2018-01-24 NOTE — Assessment & Plan Note (Addendum)
Unclear etiology. Skin tests to select food allergens were negative today. NSAIDs and emotional stress commonly exacerbate urticaria but are not the underlying etiology in this case. Physical urticarias are negative by history (i.e. pressure-induced, temperature, vibration, solar, etc.). History and lesions are not consistent with urticaria pigmentosa so I am not suspicious for mastocytosis. There are no concomitant symptoms concerning for anaphylaxis or constitutional symptoms worrisome for an underlying malignancy.   We will not order labs at this time, however, if lesions recur, persist, progress, or change in character, we will assess potential etiologies with screening labs.  For symptom relief, patient is to take oral antihistamines as directed.  A prescription has been provided for [facility] ER (carbinoxamine) 6-16 mg twice daily as needed.  Should symptoms recur, a journal is to be kept recording any foods eaten, beverages consumed, medications taken within a 6 hour period prior to the onset of symptoms, as well as record activities being performed, and environmental conditions. For any symptoms concerning for anaphylaxis, 911 is to be called immediately.

## 2018-01-24 NOTE — Assessment & Plan Note (Addendum)
The patient's history and physical examination suggest upper airway cough syndrome.  Spirometry today reveals normal ventilatory function. We will treat postnasal drainage and evaluate results.  Treatment plan as outlined above.  If the coughing persists or progresses despite this plan, we will evaluate further.

## 2018-01-24 NOTE — Assessment & Plan Note (Addendum)
   Aeroallergen avoidance measures have been discussed and provided in written form.  A prescription has been provided for [facility] ER (carbinoxamine) 6-16 mg twice daily as needed.  A prescription has been provided for fluticasone nasal spray, 1-2 sprays per nostril 2 times daily as needed. Proper nasal spray technique has been discussed and demonstrated.   Nasal saline spray (i.e., Simply Saline) or nasal saline lavage (i.e., NeilMed) is recommended as needed and prior to medicated nasal sprays.  If allergen avoidance measures and medications fail to adequately relieve symptoms, aeroallergen immunotherapy will be considered.

## 2018-05-15 ENCOUNTER — Encounter: Payer: Self-pay | Admitting: Diagnostic Neuroimaging

## 2018-05-15 ENCOUNTER — Ambulatory Visit: Payer: Medicaid Other | Admitting: Diagnostic Neuroimaging

## 2018-05-15 VITALS — BP 109/78 | HR 84 | Ht 66.0 in | Wt 194.8 lb

## 2018-05-15 DIAGNOSIS — G40309 Generalized idiopathic epilepsy and epileptic syndromes, not intractable, without status epilepticus: Secondary | ICD-10-CM

## 2018-05-15 DIAGNOSIS — M79604 Pain in right leg: Secondary | ICD-10-CM | POA: Diagnosis not present

## 2018-05-15 DIAGNOSIS — H903 Sensorineural hearing loss, bilateral: Secondary | ICD-10-CM

## 2018-05-15 DIAGNOSIS — M79605 Pain in left leg: Secondary | ICD-10-CM | POA: Diagnosis not present

## 2018-05-15 MED ORDER — LEVETIRACETAM 1000 MG PO TABS: 1000.0000 mg | ORAL_TABLET | Freq: Two times a day (BID) | ORAL | 4 refills | Status: DC

## 2018-05-15 NOTE — Progress Notes (Signed)
PATIENT: Mercedes Parks DOB: [birth year]  REASON FOR VISIT: routine follow up for seizures HISTORY FROM: patient, partner (via ASL interpreter)  Chief Complaint  Patient presents with  . Epilepsy    rm 6, deaf interpreter- Mercedes Parks, son Mercedes Parks, "no seizures; new issue with legs-pain, stiffness, sciatic nerve, bad balance when walking, go toward the left"  . Follow-up    9 month    HISTORY OF PRESENT ILLNESS:  UPDATE (08/12/17, VRP): Since last visit, doing well. Tolerating meds. No alleviating or aggravating factors. No more seizures. Does note some vivid dreams (wakes up and feels like something has touched her)  UPDATE 05/10/17: Since last visit, was doing well, and had gastric bypass surgery (Oct 2017). Then on 04/25/17, was drinking etoh (~20 oz, red's applehad not done so in a while), missed evening keppra doses in July 2018. Then had multiple breakthrough seizures outside (2-3 minutes each; 4-5 sz), then came inside with help of friends, and continued to have more seizures (another 45-60 minutes)> EMS arrived on scene. They were able to "stop" a few seizures with sternal rub. EMT raised possibility of pseudoseizures. Patient went to ER and was observed few hours and then d/c home. Records reviewed. Urine preg test neg. Labs neg. Patient was confused, tired, sore. She thinks she hit her head.   UPDATE 02/11/16: Since last visit, doing well. No seizures. Had a strange dream 2-3 weeks ago, that felt like possible seizure, then she woke up gasping for breath. Denies snoring or witnessed apnea, but lives alone. Has some daytime sleepiness even after full nights sleep.  UPDATE 02/03/15 (VRP): Since last visit, doing well. No seizures. Tolerating medications. Some more anxiety, now on cymbalta, and feels better.  UPDATE 08/05/14 (LL): Since last visit, patient has been well, no seizures. She is assisted by sign-language interpreter today. Tolerating Levitiracetam 1000 mg BID well.  Has been  having some increased blurred vision and occasional diplopia, recent eye exam was normal with the exception of a change on her eyeglass Rx. She is worried about memory loss, having trouble with remembering appointments and things that she and her son have scheduled. Has lost 40 lb. In the last year.   UPDATE 01/30/14 (LL):  Mercedes Parks comes in for routine follow up, no seizures since last visit.  She had total hysterectomy in December due to endometriosis.  Tolerating Levitiracetam 1000 mg BID with only occasional dizziness.  She reports trouble staying asleep, wakes usually 2-3 times. Doing very well.  Asks about "vaping" and if it may trigger seizure.  UPDATE 07/31/13: On 07/27/13 patient was at a [place] party, had several alcoholic drinks, was exposed to secondhand marijuana smoke, and had multiple back-to-back seizures. Apparently she had several minutes of convulsions followed by several minutes of remission. Seizures started and stopped multiple times. Ambulance was called and arrived to pick her up within 45 minutes of onset of symptoms. The symptoms continued while patient was at the hospital. She was given benzodiazepine in the ambulance. She was given 1000 mg Keppra IV at the hospital. She was continued on the same dose of antiseizure medication (levetiracetam 500 mg twice a day). Patient tells that she has been missing some doses of antiseizure medication. She misses 4-8 tablets per month. This usually happens when she falls asleep after school and misses her dose.   UPDATE 11/20/12: She is doing very well, there was no recurrent seizure, she is tolerating Keppra 500 mg twice a day.    UPDATE  02/02/12: No sz. More balance troubles, weight gain, decr energy, and fatigue. Drinks lots of "energy drinks". Having trouble with stairs (trips and falls).   UPDATE 07/06/11: Doing well. No sz. Back to driving. Now with endometriosis and planning to start lupron shots, but concerned about warning on package about  epilepsy.    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 He is accompanied by her mother, father and sign language interpreter.  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 Age 42 yrs - cochlear implants. Oct 2011 - dx'd with endometriosis; treated with tramadol; then had an episode of lightheadedness, out of body sensation, then passed out; reportedly had convulsions and tongue biting, no incontinence; then slept for 2 hours. Feb 2012 - exposure to synthetic marijuana, then had similar episode of seizure. March 2012 - several episodes of night time awakening, "weird breathing", not knowing if she is awake or dreaming; EEG done at Dr. Josefina Parks office, reported showed signs of "primary generalized epilepsy".  April 2012 - started on LEV 500mg  BID    REVIEW OF SYSTEMS: Full 14 system review of systems performed and negative except: depression anxiety dizziness headache seizure light sens double vision    ALLERGIES: Allergies  Allergen Reactions  . Amoxicillin Hives  . Hydrocodone Nausea And Vomiting  . Nsaids     Gastric Bypass Surgery  . Penicillins Hives    Has patient had a PCN reaction causing immediate rash, facial/tongue/throat swelling, SOB or lightheadedness with hypotension: Yes Has patient had a PCN reaction causing severe rash involving mucus membranes or skin necrosis: No Has patient had a PCN reaction that required hospitalization No Has patient had a PCN reaction occurring within the last 10 years: Non If all of the above answers are "NO", then may proceed with Cephalosporin use.   Mercedes Parks Antibiotics Hives    HOME MEDICATIONS: Outpatient Medications Prior to Visit  Medication Sig Dispense Refill  . acetaminophen (TYLENOL) 500 MG tablet Take 500 mg by mouth every 6 (six) hours as needed.    . Calcium Carbonate-Vit D-Min (CALCIUM 1200 PO) Take by mouth daily.     . Cholecalciferol (VITAMIN D PO) 1,600 mg daily.    Marland Kitchen estradiol (ESTRACE) 2 MG tablet Take 2 mg by mouth daily.    . fluticasone (FLONASE) 50 MCG/ACT nasal spray Place 1-2 sprays into both nostrils 2 (two) times daily. 16 g 5  . levETIRAcetam (KEPPRA) 1000 MG tablet Take 1 tablet (1,000 mg total) 2 (two) times daily by mouth. 180 tablet 4  . Multiple Vitamins-Minerals (THERA-M) TABS Take by mouth.    . Olopatadine HCl (PAZEO) 0.7 % SOLN Place 1 drop into both eyes daily. 1 Bottle 5  . omeprazole (PRILOSEC) 20 MG capsule TAKE ONE THE NIGHT BEFORE SUGERY THEN ONE TAB TWICE A DAY.    . Carbinoxamine Maleate ER [facility] ER) 4 MG/5ML SUER Take 6-10 mg by mouth 2 (two) times daily. (Patient not taking: Reported on 05/15/2018) 1 Bottle 5  . norethindrone (AYGESTIN) 5 MG tablet Take by mouth.    . valACYclovir (VALTREX) 500 MG tablet Take by mouth.     No facility-administered medications prior to visit.    Family History  Problem Relation Age of Onset  . Healthy Mother   . Healthy Father   . Colon cancer Maternal Grandfather   . Breast cancer Paternal Grandmother   . Other Paternal Grandmother  Myles Lipps disease  . Seizures Neg Hx    Past Medical History:  Diagnosis Date  . 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 Seizure [facility])    Past Surgical History:  Procedure Laterality Date  . ABDOMINAL HYSTERECTOMY  2011  . CESAREAN SECTION     @ age 31  . CHOLECYSTECTOMY    . COCHLEAR IMPLANT     @ age 12  . GASTRIC BYPASS  07/05/2016  . PANNICULECTOMY  10/12/2017   Dr. Kellie Simmering  . TONSILECTOMY, ADENOIDECTOMY, BILATERAL MYRINGOTOMY AND TUBES     Social History   Socioeconomic History  . Marital status: Single    Spouse name: Not on file  . Number of children: 1  . Years of education: College  . Highest education level: Not on file  Occupational History    Comment: n/a-unemployed  Social Needs  . Financial resource strain: Not on file  . Food insecurity:    Worry:  Not on file    Inability: Not on file  . Transportation needs:    Medical: Not on file    Non-medical: Not on file  Tobacco Use  . Smoking status: Former Smoker    Types: Cigarettes    Last attempt to quit: 09/27/2012    Years since quitting: 5.6  . Smokeless tobacco: Never Used  Substance and Sexual Activity  . Alcohol use: No    Alcohol/week: 0.0 standard drinks    Comment: quit 11/2010  . Drug use: No    Comment: marijuana; quit 3/ 2012  . Sexual activity: Not on file  Lifestyle  . Physical activity:    Days per week: Not on file    Minutes per session: Not on file  . Stress: Not on file  Relationships  . Social connections:    Talks on phone: Not on file    Gets together: Not on file    Attends religious service: Not on file    Active member of club or organization: Not on file    Attends meetings of clubs or organizations: Not on file    Relationship status: Not on file  . Intimate partner violence:    Fear of current or ex partner: Not on file    Emotionally abused: Not on file    Physically abused: Not on file    Forced sexual activity: Not on file  Other Topics Concern  . Not on file  Social History Narrative   Patient is single with one child.   Patient is right handed.   Patient has hs education and is currently in college.   Patient does not drink any caffeine.     PHYSICAL EXAM  GENERAL EXAM/CONSTITUTIONAL: Vitals:  Vitals:   05/15/18 1316  BP: 109/78  Pulse: 84  Weight: 194 lb 12.8 oz (88.4 kg)  Height: 5\' 6"  (1.676 m)     Body mass index is 31.44 kg/m. Wt Readings from Last 6 Encounters:  05/15/18 194 lb 12.8 oz (88.4 kg)  01/24/18 204 lb (92.5 kg)  08/12/17 198 lb 3.2 oz (89.9 kg)  07/26/17 192 lb (87.1 kg)  05/10/17 197 lb (89.4 kg)  04/05/16 286 lb (129.7 kg)    Patient is in no distress; well developed, nourished and groomed; neck is supple  CARDIOVASCULAR:  Examination of carotid arteries is normal; no carotid bruits  Regular  rate and rhythm, no murmurs  Examination of peripheral vascular system by observation and palpation is normal  EYES:  Ophthalmoscopic exam of optic discs and posterior segments is normal; no papilledema or hemorrhages  No exam data present  MUSCULOSKELETAL:  Gait, strength, tone, movements noted in Neurologic exam below  NEUROLOGIC: MENTAL STATUS:  No flowsheet data found.  awake, alert, oriented to person, place and time  recent and remote memory intact  normal attention and concentration  language fluent, comprehension intact, naming intact  fund of knowledge appropriate  VIA INTERPRETER  CRANIAL NERVE:   2nd - no papilledema on fundoscopic exam  2nd, 3rd, 4th, 6th - pupils equal and reactive to light, visual fields full to confrontation, extraocular muscles intact, no nystagmus  5th - facial sensation symmetric  7th - facial strength symmetric  8th - hearing --> IMPAIRED; USES SIGN LANGUAGE  9th - palate elevates symmetrically, uvula midline  11th - shoulder shrug symmetric  12th - tongue protrusion midline  MOTOR:   normal bulk and tone, full strength in the BUE, BLE  SENSORY:   normal and symmetric to light touch, pinprick, temperature, vibration; EXCEPT DECR IN RIGHT THIGH TO TEMP AND PP  COORDINATION:   finger-nose-finger, fine finger movements normal  REFLEXES:   deep tendon reflexes present and symmetric  GAIT/STATION:   narrow based gait   DIAGNOSTICS/IMAGING:  Lab Results  Component Value Date   WBC 5.8 12/03/2015   HGB 14.4 12/03/2015   HCT 43.4 12/03/2015   MCV 86.3 12/03/2015   PLT 200 12/03/2015   No flowsheet data found.  08/09/14 CT head - nothing acute  05/19/17 CT head  - Unremarkable. No acute findings.   05/18/17 EEG  - normal   ASSESSMENT: 37 y.o. with multiple episodes of loss of consciousness, convulsions, tongue biting, also with out of body experience and aura prior to the spells. Semiology suggests  complex partial seizures with secondary generalization. 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   Last seizure 07/27/13 and then 04/25/17 (in setting of ETOH and missed medication doses).    Dx:  Generalized idiopathic epilepsy, not intractable, without status epilepticus [place]  Hearing loss sensory, bilateral  Pain in both lower extremities - Plan: CK, Aldolase, CBC with Differential/Platelet, Comprehensive metabolic panel, Hemoglobin A1c, TSH, Vitamin B12    PLAN:    SEIZURE DISORDER - stable; continue levetiracetam 1000mg  twice a day - ok to continue to driving  LEG PAIN / LOW BACK PAIN / elevated CK (new problem; ? spinal stenosis, radiculopathy) - consider CT lumbar spine vs myelogram - repeat CK levels (could have been elevated due to new exercise routine in June 2019; crossfit) - check neuropathy labs - continue exercises (gradually increase); may consider PT evaluation  Orders Placed This Encounter  Procedures  . CK  . Aldolase  . CBC with Differential/Platelet  . Comprehensive metabolic panel  . Hemoglobin A1c  . TSH  . Vitamin B12   Meds ordered this encounter  Medications  . levETIRAcetam (KEPPRA) 1000 MG tablet    Sig: Take 1 tablet (1,000 mg total) by mouth 2 (two) times daily.    Dispense:  180 tablet    Refill:  4   Return in about 8 months (around 01/14/2019).    Penni Bombard, MD 4/38/8875, 7:97 PM Certified in Neurology, Neurophysiology and Neuroimaging  [facility] [address], [place], [place] [address] [phone]

## 2018-05-15 NOTE — Patient Instructions (Signed)
SEIZURE DISORDER - stable; continue levetiracetam 1000mg  twice a day  LEG PAIN / LOW BACK PAIN - check labs  - continue exercises (gradually increase) - may consider PT evaluation

## 2018-05-16 ENCOUNTER — Telehealth: Payer: Self-pay | Admitting: *Deleted

## 2018-05-16 LAB — COMPREHENSIVE METABOLIC PANEL
A/G RATIO: 1.3 (ref 1.2–2.2)
ALT: 15 IU/L (ref 0–32)
AST: 19 IU/L (ref 0–40)
Albumin: 3.9 g/dL (ref 3.5–5.5)
Alkaline Phosphatase: 89 IU/L (ref 39–117)
BUN / CREAT RATIO: 18 (ref 9–23)
BUN: 14 mg/dL (ref 6–20)
Bilirubin Total: 0.3 mg/dL (ref 0.0–1.2)
CO2: 27 mmol/L (ref 20–29)
Calcium: 9.3 mg/dL (ref 8.7–10.2)
Chloride: 99 mmol/L (ref 96–106)
Creatinine, Ser: 0.8 mg/dL (ref 0.57–1.00)
GFR calc Af Amer: 110 mL/min/{1.73_m2} (ref >59)
GFR, EST NON AFRICAN AMERICAN: 95 mL/min/{1.73_m2} (ref >59)
GLOBULIN, TOTAL: 2.9 g/dL (ref 1.5–4.5)
Glucose: 87 mg/dL (ref 65–99)
Potassium: 4.6 mmol/L (ref 3.5–5.2)
SODIUM: 142 mmol/L (ref 134–144)
Total Protein: 6.8 g/dL (ref 6.0–8.5)

## 2018-05-16 LAB — CBC WITH DIFFERENTIAL/PLATELET
Basophils Absolute: 0 10*3/uL (ref 0.0–0.2)
Basos: 1 %
EOS (ABSOLUTE): 0.1 10*3/uL (ref 0.0–0.4)
EOS: 1 %
HEMATOCRIT: 47.3 % — AB (ref 34.0–46.6)
Hemoglobin: 15.5 g/dL (ref 11.1–15.9)
Immature Grans (Abs): 0 10*3/uL (ref 0.0–0.1)
Immature Granulocytes: 0 %
LYMPHS ABS: 2.2 10*3/uL (ref 0.7–3.1)
Lymphs: 29 %
MCH: 28.6 pg (ref 26.6–33.0)
MCHC: 32.8 g/dL (ref 31.5–35.7)
MCV: 87 fL (ref 79–97)
MONOS ABS: 0.5 10*3/uL (ref 0.1–0.9)
Monocytes: 7 %
NEUTROS ABS: 4.6 10*3/uL (ref 1.4–7.0)
Neutrophils: 62 %
Platelets: 292 10*3/uL (ref 150–450)
RBC: 5.42 x10E6/uL — ABNORMAL HIGH (ref 3.77–5.28)
RDW: 12.5 % (ref 12.3–15.4)
WBC: 7.4 10*3/uL (ref 3.4–10.8)

## 2018-05-16 LAB — VITAMIN B12: Vitamin B-12: 680 pg/mL (ref 232–1245)

## 2018-05-16 LAB — CK: Total CK: 67 U/L (ref 24–173)

## 2018-05-16 LAB — HEMOGLOBIN A1C
ESTIMATED AVERAGE GLUCOSE: 97 mg/dL
Hgb A1c MFr Bld: 5 % (ref 4.8–5.6)

## 2018-05-16 LAB — TSH: TSH: 3.43 u[IU]/mL (ref 0.450–4.500)

## 2018-05-16 LAB — ALDOLASE: Aldolase: 4.9 U/L (ref 3.3–10.3)

## 2018-05-16 NOTE — Telephone Encounter (Signed)
Called patient through deaf interpreter on her phone. Informed her that her lab results are unremarkable. She asked if they were in normal limits now; this RN confirmed that. She asked  if taking magnesium had helped; this RN advised Dr Leta Baptist didn't specifically check magnesium level. This RN wasn't sure if it had helped.  She asked if Dr Leta Baptist would be referring her to a surgeon for back surgery or her PCP. This RN reviewed Dr Gladstone Lighter plan per office note and advised her that he would not; she may contact her PCP.  She had no other questions, verbalized understanding, appreciation of call.

## 2018-06-26 ENCOUNTER — Ambulatory Visit: Payer: Medicaid Other | Admitting: Allergy and Immunology

## 2018-06-26 ENCOUNTER — Encounter: Payer: Self-pay | Admitting: Allergy and Immunology

## 2018-06-26 ENCOUNTER — Other Ambulatory Visit: Payer: Self-pay | Admitting: *Deleted

## 2018-06-26 ENCOUNTER — Other Ambulatory Visit: Payer: Self-pay | Admitting: Allergy and Immunology

## 2018-06-26 DIAGNOSIS — W57XXXD Bitten or stung by nonvenomous insect and other nonvenomous arthropods, subsequent encounter: Secondary | ICD-10-CM

## 2018-06-26 DIAGNOSIS — S70369D Insect bite (nonvenomous), unspecified thigh, subsequent encounter: Secondary | ICD-10-CM

## 2018-06-26 DIAGNOSIS — J3089 Other allergic rhinitis: Secondary | ICD-10-CM

## 2018-06-26 DIAGNOSIS — W57XXXA Bitten or stung by nonvenomous insect and other nonvenomous arthropods, initial encounter: Secondary | ICD-10-CM | POA: Insufficient documentation

## 2018-06-26 DIAGNOSIS — L500 Allergic urticaria: Secondary | ICD-10-CM | POA: Diagnosis not present

## 2018-06-26 MED ORDER — FLUTICASONE PROPIONATE 50 MCG/ACT NA SUSP: 1.0000 | Freq: Every day | NASAL | 5 refills | Status: DC | PRN

## 2018-06-26 MED ORDER — CARBINOXAMINE MALEATE ER 4 MG/5ML PO SUER: 6.0000 mg | Freq: Two times a day (BID) | ORAL | 5 refills | Status: DC

## 2018-06-26 MED ORDER — OLOPATADINE HCL 0.7 % OP SOLN: 1.0000 [drp] | Freq: Every day | OPHTHALMIC | 5 refills | Status: DC

## 2018-06-26 MED ORDER — AZELASTINE HCL 0.15 % NA SOLN: 1.0000 | Freq: Two times a day (BID) | NASAL | 5 refills | Status: DC | PRN

## 2018-06-26 MED ORDER — IPRATROPIUM BROMIDE 0.06 % NA SOLN: NASAL | 5 refills | Status: DC

## 2018-06-26 NOTE — Assessment & Plan Note (Addendum)
   Continue appropriate allergen avoidance measures.  A prescription has been provided for azelastine nasal spray, 1-2 sprays per nostril 2 times daily as needed. Proper nasal spray technique has been discussed and demonstrated.   May add fluticasone nasal spray if needed.  Nasal saline lavage (NeilMed) has been recommended as needed and prior to medicated nasal sprays along with instructions for proper administration.  Discontinue fexofenadine (Allegra).  Start [place] ER 6 to 16 mg twice daily as needed.  If allergen avoidance measures and medications fail to adequately relieve symptoms, aeroallergen immunotherapy will be considered.

## 2018-06-26 NOTE — Progress Notes (Signed)
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  She was last seen in this clinic on January 24, 2018.  She is accompanied today by a sign language interpreter who assists with the history.  She reports that she has had 3 sinus infections in the interval since her previous visit.  She is currently taking fexofenadine daily and fluticasone nasal spray as needed.  She also complains of insect bites that she developes on her legs every time she goes and visits her partner's parents' at home, typically when she is outdoors.  These bites result in small, erythematous, pruritic lesions.  She does not experience large local reactions, generalized urticaria, concomitant angioedema, cardiopulmonary symptoms, or GI symptoms.  Assessment and plan: Seasonal and perennial allergic rhinitis  Continue appropriate allergen avoidance measures.  A prescription has been provided for azelastine nasal spray, 1-2 sprays per nostril 2 times daily as needed. Proper nasal spray technique has been discussed and demonstrated.   May add fluticasone nasal spray if needed.  Nasal saline lavage (NeilMed) has been recommended as needed and prior to medicated nasal sprays along with instructions for proper administration.  Discontinue fexofenadine (Allegra).  Start [place] ER 6 to 16 mg twice daily as needed.  If allergen avoidance measures and medications fail to adequately relieve symptoms, aeroallergen immunotherapy will be considered.  Insect bite  I have recommended hiring of an exterminator for the home and surrounding properly well as long sleeves, long pants, and the use of insect repellent.  Urticaria Currently quiescent.  Continue  antihistamines if needed.   Meds ordered this encounter  Medications  . Carbinoxamine Maleate ER [facility] ER) 4 MG/5ML SUER    Sig: Take 6-10 mg by mouth 2 (two) times daily.    Dispense:  1 Bottle    Refill:  5  . fluticasone (FLONASE) 50 MCG/ACT nasal spray    Sig: Place 1-2 sprays into both nostrils daily as needed for allergies or rhinitis.    Dispense:  16 g    Refill:  5  . Olopatadine HCl (PAZEO) 0.7 % SOLN    Sig: Place 1 drop into both eyes daily.    Dispense:  1 Bottle    Refill:  5    BRAND NAME ONLY.  Marland Kitchen Azelastine HCl 0.15 % SOLN    Sig: Place 1-2 sprays into both nostrils 2 (two) times daily as needed.    Dispense:  30 mL    Refill:  5    Physical examination: Blood pressure 112/70, pulse 68, resp. rate 16, SpO2 98 %.  General: Alert, interactive, in no acute distress. HEENT: TMs pearly gray, turbinates edematous with thick discharge, post-pharynx erythematous. Neck: Supple without lymphadenopathy. Lungs: Clear to auscultation without wheezing, rhonchi or rales. CV: Normal S1, S2 without murmurs. Skin: 3 erythematous papules on the left lower extremity.  The following portions of the patient's history were reviewed and updated as appropriate: allergies, current medications, past family history, past medical history, past social history, past surgical history and problem list.  Allergies as of 06/26/2018      Reactions   Amoxicillin Hives   Hydrocodone Nausea And Vomiting   Nsaids    Gastric Bypass Surgery   Penicillins Hives   Has patient had a PCN  reaction causing immediate rash, facial/tongue/throat swelling, SOB or lightheadedness with hypotension: Yes Has patient had a PCN reaction causing severe rash involving mucus membranes or skin necrosis: No Has patient had a PCN reaction that required hospitalization No Has patient had a PCN reaction occurring within the last 10 years: Non If all of the above answers are "NO", then may proceed with Cephalosporin  use.   Sulfa Antibiotics Hives      Medication List        Accurate as of 06/26/18 10:23 PM. Always use your most recent med list.          acetaminophen 500 MG tablet Commonly known as:  TYLENOL Take 500 mg by mouth every 6 (six) hours as needed.   Azelastine HCl 0.15 % Soln Place 1-2 sprays into both nostrils 2 (two) times daily as needed.   CALCIUM 1200 PO Take by mouth daily.   Carbinoxamine Maleate ER 4 MG/5ML Suer Take 6-10 mg by mouth 2 (two) times daily.   estradiol 2 MG tablet Commonly known as:  ESTRACE Take 2 mg by mouth daily.   fluticasone 50 MCG/ACT nasal spray Commonly known as:  FLONASE Place 1-2 sprays into both nostrils daily as needed for allergies or rhinitis.   ipratropium 0.06 % nasal spray Commonly known as:  ATROVENT 2 sprays per nostirl 2-3 times per day as needed.   levETIRAcetam 1000 MG tablet Commonly known as:  KEPPRA Take 1 tablet (1,000 mg total) by mouth 2 (two) times daily.   norethindrone 5 MG tablet Commonly known as:  AYGESTIN Take by mouth.   Olopatadine HCl 0.7 % Soln Place 1 drop into both eyes daily.   omeprazole 20 MG capsule Commonly known as:  PRILOSEC TAKE ONE THE NIGHT BEFORE SUGERY THEN ONE TAB TWICE A DAY.   THERA-M Tabs Take by mouth.   valACYclovir 500 MG tablet Commonly known as:  VALTREX Take by mouth.   VITAMIN D PO 1,600 mg daily.       Allergies  Allergen Reactions  . Amoxicillin Hives  . Hydrocodone Nausea And Vomiting  . Nsaids     Gastric Bypass Surgery  . Penicillins Hives    Has patient had a PCN reaction causing immediate rash, facial/tongue/throat swelling, SOB or lightheadedness with hypotension: Yes Has patient had a PCN reaction causing severe rash involving mucus membranes or skin necrosis: No Has patient had a PCN reaction that required hospitalization No Has patient had a PCN reaction occurring within the last 10 years: Non If all of the above answers are "NO", then may  proceed with Cephalosporin use.   . Sulfa Antibiotics Hives   Review of systems: Review of systems negative except as noted in HPI / PMHx or noted below: Constitutional: Negative.  HENT: Negative.   Eyes: Negative.  Respiratory: Negative.   Cardiovascular: Negative.  Gastrointestinal: Negative.  Genitourinary: Negative.  Musculoskeletal: Negative.  Neurological: Negative.  Endo/Heme/Allergies: Negative.  Cutaneous: Negative.  Past Medical History:  Diagnosis Date  . 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 Seizure [facility])     Family History  Problem Relation Age of Onset  . Healthy Mother   . Healthy Father   . Colon cancer Maternal Grandfather   . Breast cancer Paternal Grandmother   . Other Paternal Grandmother        Myles Lipps disease  . Seizures Neg Hx     Social History   Socioeconomic History  . Marital status: Single  Spouse name: Not on file  . Number of children: 1  . Years of education: College  . Highest education level: Not on file  Occupational History    Comment: n/a-unemployed  Social Needs  . Financial resource strain: Not on file  . Food insecurity:    Worry: Not on file    Inability: Not on file  . Transportation needs:    Medical: Not on file    Non-medical: Not on file  Tobacco Use  . Smoking status: Former Smoker    Types: Cigarettes    Last attempt to quit: 09/27/2012    Years since quitting: 5.7  . Smokeless tobacco: Never Used  Substance and Sexual Activity  . Alcohol use: No    Alcohol/week: 0.0 standard drinks    Comment: quit 11/2010  . Drug use: No    Comment: marijuana; quit 3/ 2012  . Sexual activity: Not on file  Lifestyle  . Physical activity:    Days per week: Not on file    Minutes per session: Not on file  . Stress: Not on file  Relationships  . Social connections:    Talks on phone: Not on file    Gets together: Not on file    Attends religious service: Not on file    Active member of club or  organization: Not on file    Attends meetings of clubs or organizations: Not on file    Relationship status: Not on file  . Intimate partner violence:    Fear of current or ex partner: Not on file    Emotionally abused: Not on file    Physically abused: Not on file    Forced sexual activity: Not on file  Other Topics Concern  . Not on file  Social History Narrative   Patient is single with one child.   Patient is right handed.   Patient has hs education and is currently in college.   Patient does not drink any caffeine.    I appreciate the opportunity to take part in Dartha's care. Please do not hesitate to contact me with questions.  Sincerely,   R. Edgar Frisk, MD

## 2018-06-26 NOTE — Assessment & Plan Note (Signed)
   I have recommended hiring of an exterminator for the home and surrounding properly well as long sleeves, long pants, and the use of insect repellent.

## 2018-06-26 NOTE — Assessment & Plan Note (Signed)
Currently quiescent.  Continue antihistamines if needed.

## 2018-06-26 NOTE — Telephone Encounter (Signed)
Please provide a prescription for ipratropium 0.06% nasal spray, 2 sprays per nostril 2 or 3 times daily as needed.  Thanks.

## 2018-06-26 NOTE — Telephone Encounter (Signed)
Prescription has been sent in  

## 2018-06-26 NOTE — Patient Instructions (Addendum)
Seasonal and perennial allergic rhinitis  Continue appropriate allergen avoidance measures.  A prescription has been provided for azelastine nasal spray, 1-2 sprays per nostril 2 times daily as needed. Proper nasal spray technique has been discussed and demonstrated.   May add fluticasone nasal spray if needed.  Nasal saline lavage (NeilMed) has been recommended as needed and prior to medicated nasal sprays along with instructions for proper administration.  Discontinue fexofenadine (Allegra).  Start [place] ER 6 to 16 mg twice daily as needed.  If allergen avoidance measures and medications fail to adequately relieve symptoms, aeroallergen immunotherapy will be considered.  Insect bite  I have recommended hiring of an exterminator for the home and surrounding properly well as long sleeves, long pants, and the use of insect repellent.  Urticaria Currently quiescent.  Continue antihistamines if needed.   Return in about 6 months (around 12/25/2018), or if symptoms worsen or fail to improve.

## 2018-06-26 NOTE — Telephone Encounter (Signed)
Alternate nasal spray required. Please send New Rx

## 2018-06-28 ENCOUNTER — Other Ambulatory Visit: Payer: Self-pay | Admitting: Allergy

## 2018-06-28 MED ORDER — AZELASTINE HCL 0.1 % NA SOLN: NASAL | 5 refills | Status: DC

## 2018-06-28 NOTE — Telephone Encounter (Signed)
Changed to Azelastine 0.1%

## 2018-06-28 NOTE — Telephone Encounter (Signed)
Insurance will cover generic Astelin (azelastine 0.1%).  Sent in new Rx.  Patient notified.

## 2018-06-29 ENCOUNTER — Other Ambulatory Visit: Payer: Self-pay

## 2018-06-29 MED ORDER — AZELASTINE HCL 0.1 % NA SOLN: NASAL | 5 refills | Status: DC

## 2018-08-15 ENCOUNTER — Encounter (HOSPITAL_COMMUNITY): Payer: Self-pay

## 2018-08-15 ENCOUNTER — Ambulatory Visit (HOSPITAL_COMMUNITY): Payer: Medicaid Other | Admitting: Physical Therapy

## 2018-08-17 ENCOUNTER — Encounter (HOSPITAL_COMMUNITY): Payer: Self-pay | Admitting: Physical Therapy

## 2018-08-17 ENCOUNTER — Ambulatory Visit (HOSPITAL_COMMUNITY): Payer: Medicaid Other | Attending: Physician Assistant | Admitting: Physical Therapy

## 2018-08-17 ENCOUNTER — Other Ambulatory Visit: Payer: Self-pay

## 2018-08-17 DIAGNOSIS — M5440 Lumbago with sciatica, unspecified side: Secondary | ICD-10-CM | POA: Diagnosis present

## 2018-08-17 DIAGNOSIS — R29898 Other symptoms and signs involving the musculoskeletal system: Secondary | ICD-10-CM | POA: Insufficient documentation

## 2018-08-17 DIAGNOSIS — M6281 Muscle weakness (generalized): Secondary | ICD-10-CM | POA: Diagnosis present

## 2018-08-17 DIAGNOSIS — G8929 Other chronic pain: Secondary | ICD-10-CM | POA: Diagnosis present

## 2018-08-17 NOTE — Therapy (Signed)
[place], [place], [address] Phone: [phone]   Fax:  [phone]  Physical Therapy Evaluation  Patient Details  Name: Mercedes Parks MRN: [id number] Date of Birth: 01/03/1981 Referring Provider (PT): Allwardt, Alyssa PA   Encounter Date: 08/17/2018  PT End of Session - 08/17/18 1552    Visit Number  1    Number of Visits  13    Date for PT Re-Evaluation  09/29/18   09/07/18 mini re-assess   Authorization Type  Medicaid    Authorization Time Period  08/17/18-09/29/18; 3 visits requested: check for approval    Authorization - Visit Number  0    Authorization - Number of Visits  3    PT Start Time  1345    PT Stop Time  1426    PT Time Calculation (min)  41 min    Activity Tolerance  Patient tolerated treatment well    Behavior During Therapy  [facility] for tasks assessed/performed       Past Medical History:  Diagnosis Date  . 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 Seizure [facility])     Past Surgical History:  Procedure Laterality Date  . ABDOMINAL HYSTERECTOMY  2011  . CESAREAN SECTION     @ age 38  . CHOLECYSTECTOMY    . COCHLEAR IMPLANT     @ age 80  . GASTRIC BYPASS  07/05/2016  . PANNICULECTOMY  10/12/2017   Dr. Kellie Simmering  . TONSILECTOMY, ADENOIDECTOMY, BILATERAL MYRINGOTOMY AND TUBES      There were no vitals filed for this visit.   Subjective Assessment - 08/17/18 1359    Subjective  Patient reported that she has had sciatica in her right leg and back pain for many years. Patient stated that she had begun cross-fit several months ago, but stopped due to concerns about her leg pain. Patient reported that standing too long or walking too much causes pain. She stated that the pain goes down to her knee and stops. Patient said the pain happened last this Monday and she took a muscle relaxer and heating pad and it has been okay since then. She stated at that time her pain had been a 10/10 at its  maximum which she said was a sharp pain. Patient denied any tingling or numbness. Patient denied any bowel and bladder changes or any weight changes. Patient stated she had a CT scan a few months ago but it was negative. Patient also reported that her left shoulder has started to bother her occassionally and is a similar pain to her back and leg.     Patient is accompained by:  Interpreter   [name] with [employer]   Limitations  Walking;Standing;Sitting    How long can you sit comfortably?  It depends    How long can you stand comfortably?  30 minutes    How long can you walk comfortably?  It depends    Patient Stated Goals  Decreased pain    Currently in Pain?  No/denies         [facility] PT Assessment - 08/17/18 0001      Assessment   Medical Diagnosis  Sciatica    Referring Provider (PT)  Allwardt, Alyssa PA    Onset Date/Surgical Date  --   Many years   Next MD Visit  --   Unknown   Prior Therapy  Yes for her knee  Balance Screen   Has the patient fallen in the past 6 months  No    Has the patient had a decrease in activity level because of a fear of falling?   Yes    Is the patient reluctant to leave their home because of a fear of falling?   No      Home Environment   Living Environment  Private residence    Living Arrangements  Spouse/significant other;Children    Type of [place] to enter    Entrance Stairs-Number of Steps  6    Entrance Stairs-Rails  Can reach both;Right;Left    Home Layout  One level      Prior Function   Level of Independence  Independent;Independent with basic ADLs      Cognition   Overall Cognitive Status  Within Functional Limits for tasks assessed      Functional Tests   Functional tests  Squat;Other      Squat   Comments  Patient demonstrated decreased knee bending with squat to stand with a box      Other:   Other/ Comments  Noted decreased knee bending and compensation through back with  overhead lifting      Posture/Postural Control   Posture/Postural Control  Postural limitations    Postural Limitations  Increased lumbar lordosis;Anterior pelvic tilt      Tone   Assessment Location  Right Lower Extremity;Left Lower Extremity      ROM / Strength   AROM / PROM / Strength  AROM;Strength      AROM   AROM Assessment Site  Lumbar    Lumbar Flexion  25% limited    Lumbar Extension  25% limited    Lumbar - Right Side Bend  WFL    Lumbar - Left Side Bend  WFL    Lumbar - Right Rotation  WFL    Lumbar - Left Rotation  [facility]      Strength   Strength Assessment Site  Hip;Knee;Ankle    Right/Left Hip  Right;Left    Right Hip Flexion  4/5   Painful   Right Hip Extension  4-/5    Right Hip ABduction  4+/5    Left Hip Flexion  4/5    Left Hip Extension  4-/5    Left Hip ABduction  4/5    Right/Left Knee  Right;Left    Right Knee Flexion  4+/5    Right Knee Extension  4+/5    Left Knee Flexion  4+/5    Left Knee Extension  4+/5    Right/Left Ankle  Right;Left    Right Ankle Dorsiflexion  5/5    Left Ankle Dorsiflexion  5/5      Flexibility   Soft Tissue Assessment /Muscle Length  yes    Hamstrings  Right 50% limited; Left 25% limited      Palpation   Spinal mobility  Thoracic spine was relatively hypomobile compared to the lumbar spine.    Palpation comment  Patient reported tenderness to palpation at T10- L5. Patient denied pain to palpation of PSIS or with sacral thrust.       Special Tests    Special Tests  Lumbar    Lumbar Tests  Slump Test      Slump test   Findings  Positive    Comment  Patient was positive for radicular pain bilaterally, but reported it was not as intense as her usual  pain      Transfers   Five time sit to stand comments   9.35 seconds from standard height chair without upper extremity assistance      Balance   Balance Assessed  Yes      Static Standing Balance   Static Standing - Balance Support  No upper extremity supported     Static Standing Balance -  Activities   Single Leg Stance - Right Leg;Single Leg Stance - Left Leg    Static Standing - Comment/# of Minutes  Right 11.84 seconds; Left 3.72 seconds      RLE Tone   RLE Tone  --   Ankle clonus absent     LLE Tone   LLE Tone  --   Ankle clonus absent               Objective measurements completed on examination: See above findings.              PT Education - 08/17/18 1551    Education Details  Discussed examination findings and plan of care.     Person(s) Educated  Patient    Methods  Explanation    Comprehension  Other (comment)   Acknowledged understanding      PT Short Term Goals - 08/17/18 1553      PT SHORT TERM GOAL #1   Title  Patient will report understanding and regular compliance with HEP in order to improve strength, balance, flexibility and functional mobility.     Time  3    Period  Weeks    Status  New    Target Date  09/07/18      PT SHORT TERM GOAL #2   Title  Patient will report that her back and leg pain has not exceeded a 5/10 pain over the course of a 1 week period indicating improved tolerance to daily activities.     Baseline  08/17/18: Patient stated a maximum of 10/10 pain.     Time  3    Period  Weeks    Status  New    Target Date  09/07/18      PT SHORT TERM GOAL #3   Title  Patient will demonstrate improvement of 1/2 MMT strength grade in all muscle groups tested as deficient to improve mechanics with squating and decrease back pain.     Time  3    Period  Weeks    Status  New    Target Date  09/07/18        PT Long Term Goals - 08/17/18 1556      PT LONG TERM GOAL #1   Title  Patient will report that her back and leg pain has not exceeded a 3/10 pain over the course of a 1 week period indicating improved tolerance to daily activities.     Time  6    Period  Weeks    Status  New    Target Date  09/28/18      PT LONG TERM GOAL #2   Title  Patient will demonstrate ability to  perform single limb stance for at least 15 seconds on each lower extremity indicating improved stability and balance.     Time  6    Period  Weeks    Status  New    Target Date  09/28/18      PT LONG TERM GOAL #3   Title  Patient will demonstrate improvement of 1 MMT strength grade  in all muscle groups tested as deficient to improve mechanics with squating and decrease back pain.     Time  6    Period  Weeks    Status  New    Target Date  09/28/18      PT LONG TERM GOAL #4   Title  Patient will be educated on proper squatting and lifting mechanics and demonstrate proper form with lifting a box from floor and to overhead cabinet independently.     Time  6    Period  Weeks    Status  New    Target Date  09/28/18      PT LONG TERM GOAL #5   Title  Patient will report ability to ambulate for 45 minutes without onset of pain in order to be able to go shopping in the community without requiring as frequent of rest breaks.     Baseline  08/17/18: Patient reported ability to ambulate for 30 minutes before onset of pain    Time  6    Period  Weeks    Status  New    Target Date  09/28/18      Additional Long Term Goals   Additional Long Term Goals  Yes      PT LONG TERM GOAL #6   Title  Patient will report beginning an exercise program 1-2 times per week for improved quality of life.     Time  6    Period  Weeks    Status  New    Target Date  09/28/18             Plan - 08/17/18 1718    Clinical Impression Statement  Patient is a 38 year old female who presented to physical therapy with reports of chronic low back pain and sciatica which has been occurring for several years. Upon examination noted decreased lumbar mobility and decreased lower extremity strength, most notably in hips bilaterally. With palpation noted decreased thoracic mobility compared to lumbar mobility, as well as patient reported pain through lower thoracic and lumbar spine. Patient demonstrated decreased  flexibility in bilateral hamstrings with the right tighter than the left. With slump test, patient was positive with the head movement as the sensitizing maneuver, however patient stated this pain was not as sharp as her usual pain. Patient demonstrated decreased balance with single limb stance, she also demonstrated decreased mechanics with squatting and deficits in her posture. Patient also reported some shoulder pain, which therapist discussed with patient and stated that this evaluation would focus on her leg and back pain, but that she may place a referral to occupational therapy later on to address this if patient continues to have difficulty with it, and patient agreed to this. Patient would benefit from skilled physical therapy in order to address the abovementioned deficits and help patient return to prior level of function.     History and Personal Factors relevant to plan of care:  Hearing loss and uses sign language    Clinical Presentation  Stable    Clinical Presentation due to:  5xSTS, MMT, single limb stance, palpation, clinical judgement    Clinical Decision Making  Moderate    Rehab Potential  Good    Clinical Impairments Affecting Rehab Potential  Positive: Motivated; Negative: chronicity of back pain    PT Frequency  2x / week    PT Duration  6 weeks    PT Treatment/Interventions  ADLs/Self Care Home Management;Aquatic Therapy;Moist Heat;DME Instruction;Gait training;Stair  training;Functional mobility training;Therapeutic activities;Therapeutic exercise;Balance training;Neuromuscular re-education;Patient/family education;Orthotic Fit/Training;Manual techniques;Passive range of motion;Dry needling;Energy conservation;Taping    PT Next Visit Plan  Review evaluation and goals, test LE reflexes, initiate HEP trial and consider including: bridges, SLR, hamstring stretch, SKTC, DKTC. Initiate mat exercises for lumbar stabilization, LE strengthening.     PT Home Exercise Plan  Initiate at  first session    Consulted and Agree with Plan of Care  Patient       Patient will benefit from skilled therapeutic intervention in order to improve the following deficits and impairments:  Pain, Improper body mechanics, Decreased mobility, Postural dysfunction, Decreased activity tolerance, Decreased endurance, Decreased range of motion, Decreased strength, Hypomobility, Decreased balance, Difficulty walking, Impaired flexibility  Visit Diagnosis: Chronic low back pain with sciatica, sciatica laterality unspecified, unspecified back pain laterality  Muscle weakness (generalized)  Other symptoms and signs involving the musculoskeletal system     Problem List Patient Active Problem List   Diagnosis Date Noted  . Insect bite 06/26/2018  . Urticaria 01/24/2018  . Seasonal and perennial allergic rhinitis 01/24/2018  . Allergic conjunctivitis 01/24/2018  . Coughing 01/24/2018  . Fatigue 04/05/2016  . Hypersomnia with sleep apnea 04/05/2016  . Snoring 04/05/2016  . Hearing loss sensory, bilateral 04/05/2016  . Constipation 07/14/2015  . Abdominal pain 07/14/2015  . Generalized idiopathic epilepsy, not intractable, without status epilepticus ([place]) 02/03/2015   Clarene Critchley PT, DPT 5:25 PM, 08/17/18 [place] [address] [place], [place], [address] Phone: [phone]   Fax:  [phone]  Name: Mercedes Parks MRN: [id number] Date of Birth: 10-Jun-1981

## 2018-08-21 ENCOUNTER — Telehealth (HOSPITAL_COMMUNITY): Payer: Self-pay | Admitting: Physical Therapy

## 2018-08-21 NOTE — Telephone Encounter (Signed)
Patient left a massage to cancel her appt.

## 2018-08-22 ENCOUNTER — Ambulatory Visit (HOSPITAL_COMMUNITY): Payer: Medicaid Other | Admitting: Physical Therapy

## 2018-08-29 ENCOUNTER — Ambulatory Visit (HOSPITAL_COMMUNITY): Payer: Medicaid Other | Attending: Physician Assistant

## 2018-08-29 ENCOUNTER — Encounter (HOSPITAL_COMMUNITY): Payer: Self-pay

## 2018-08-29 DIAGNOSIS — R29898 Other symptoms and signs involving the musculoskeletal system: Secondary | ICD-10-CM | POA: Insufficient documentation

## 2018-08-29 DIAGNOSIS — G8929 Other chronic pain: Secondary | ICD-10-CM | POA: Diagnosis present

## 2018-08-29 DIAGNOSIS — M5440 Lumbago with sciatica, unspecified side: Secondary | ICD-10-CM | POA: Diagnosis present

## 2018-08-29 DIAGNOSIS — M6281 Muscle weakness (generalized): Secondary | ICD-10-CM | POA: Diagnosis present

## 2018-08-29 NOTE — Patient Instructions (Addendum)
Knee to Chest (Flexion)    Pull knee toward chest. Feel stretch in lower back or buttock area. Breathing deeply, Hold 30 seconds. Repeat with other knee. Repeat 3 times. Do 1-2 sessions per day.  http://gt2.exer.us/226   Copyright  VHI. All rights reserved.   Double Knee to Chest (Flexion)    Gently pull both knees toward chest. Feel stretch in lower back or buttock area. Breathing deeply, Hold 30 seconds. Repeat 3 times. Do 1-2 sessions per day.  http://gt2.exer.us/228   Copyright  VHI. All rights reserved.   Hamstring Stretch    With other leg bent, foot flat, grasp right leg and slowly try to straighten knee. Hold 30 seconds. Repeat 3 times. Do 1-2 sessions per day.  http://gt2.exer.us/280   Copyright  VHI. All rights reserved.   Bridge    Lie back, legs bent. Inhale, pressing hips up. Keeping ribs in, lengthen lower back. Exhale, rolling down along spine from top. Repeat 10 times holding for 3-5 seconds. Do 1-2 sessions per day.  http://pm.exer.us/55   Copyright  VHI. All rights reserved.   Straight Leg Raise    Tighten stomach and slowly raise locked right leg 12 inches from floor. Repeat 10 times per set. Do 1-2 sets per session.   http://orth.exer.us/1103   Copyright  VHI. All rights reserved.

## 2018-08-29 NOTE — Therapy (Signed)
[place], [place], [address] Phone: [phone]   Fax:  [phone]  Physical Therapy Treatment  Patient Details  Name: Mercedes Parks MRN: [id number] Date of Birth: 1981-01-18 Referring Provider (PT): Allwardt, Alyssa PA   Encounter Date: 08/29/2018  PT End of Session - 08/29/18 0828    Visit Number  2    Number of Visits  13    Date for PT Re-Evaluation  09/29/18   Minireassess 09/07/18   Authorization Type  Medicaid     Authorization Time Period  3 visits approved: 11/25-->09/03/18    Authorization - Visit Number  1    Authorization - Number of Visits  3    PT Start Time  0815    PT Stop Time  0900    PT Time Calculation (min)  45 min    Activity Tolerance  Patient tolerated treatment well    Behavior During Therapy  [facility] for tasks assessed/performed       Past Medical History:  Diagnosis Date  . 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 Seizure [facility])     Past Surgical History:  Procedure Laterality Date  . ABDOMINAL HYSTERECTOMY  2011  . CESAREAN SECTION     @ age 27  . CHOLECYSTECTOMY    . COCHLEAR IMPLANT     @ age 21  . GASTRIC BYPASS  07/05/2016  . PANNICULECTOMY  10/12/2017   Dr. Kellie Simmering  . TONSILECTOMY, ADENOIDECTOMY, BILATERAL MYRINGOTOMY AND TUBES      There were no vitals filed for this visit.  Subjective Assessment - 08/29/18 0819    Subjective  Pt reports she is feeling good today, no reports of pain currently.    Patient is accompained by:  Interpreter   [name] with [employer]   Patient Stated Goals  Decreased pain    Currently in Pain?  No/denies         [facility] PT Assessment - 08/29/18 0001      Assessment   Medical Diagnosis  Sciatica    Referring Provider (PT)  Allwardt, Alyssa PA    Onset Date/Surgical Date  --   Many years   Next MD Visit  --   Unknown   Prior Therapy  Yes for her knee                   [facility] Adult PT  Treatment/Exercise - 08/29/18 0001      Bed Mobility   Bed Mobility  Sit to Sidelying Left    Sit to Sidelying Left  Supervision/Verbal cueing   cueing for log roll mechanics     Exercises   Exercises  Lumbar      Lumbar Exercises: Stretches   Active Hamstring Stretch  3 reps;30 seconds    Active Hamstring Stretch Limitations  supine    Single Knee to Chest Stretch  3 reps;30 seconds    Double Knee to Chest Stretch  3 reps;30 seconds      Lumbar Exercises: Supine   Ab Set  10 reps;5 seconds    AB Set Limitations  cueing for proper activation    Bent Knee Raise  10 reps;3 seconds    Bridge  10 reps    Straight Leg Raise  10 reps      Lumbar Exercises: Sidelying   Clam  10 reps    Clam Limitations  RTB  PT Education - 08/29/18 0850    Education Details  Reviewed goals, educated proper bed mobility (log roll), established HEP, pt able to demonstrated and signed understanding to interpretor    Person(s) Educated  Patient    Methods  Explanation;Demonstration;Handout   HEP   Comprehension  --   Acknowledged understanding      PT Short Term Goals - 08/17/18 1553      PT SHORT TERM GOAL #1   Title  Patient will report understanding and regular compliance with HEP in order to improve strength, balance, flexibility and functional mobility.     Time  3    Period  Weeks    Status  New    Target Date  09/07/18      PT SHORT TERM GOAL #2   Title  Patient will report that her back and leg pain has not exceeded a 5/10 pain over the course of a 1 week period indicating improved tolerance to daily activities.     Baseline  08/17/18: Patient stated a maximum of 10/10 pain.     Time  3    Period  Weeks    Status  New    Target Date  09/07/18      PT SHORT TERM GOAL #3   Title  Patient will demonstrate improvement of 1/2 MMT strength grade in all muscle groups tested as deficient to improve mechanics with squating and decrease back pain.     Time  3    Period   Weeks    Status  New    Target Date  09/07/18        PT Long Term Goals - 08/17/18 1556      PT LONG TERM GOAL #1   Title  Patient will report that her back and leg pain has not exceeded a 3/10 pain over the course of a 1 week period indicating improved tolerance to daily activities.     Time  6    Period  Weeks    Status  New    Target Date  09/28/18      PT LONG TERM GOAL #2   Title  Patient will demonstrate ability to perform single limb stance for at least 15 seconds on each lower extremity indicating improved stability and balance.     Time  6    Period  Weeks    Status  New    Target Date  09/28/18      PT LONG TERM GOAL #3   Title  Patient will demonstrate improvement of 1 MMT strength grade in all muscle groups tested as deficient to improve mechanics with squating and decrease back pain.     Time  6    Period  Weeks    Status  New    Target Date  09/28/18      PT LONG TERM GOAL #4   Title  Patient will be educated on proper squatting and lifting mechanics and demonstrate proper form with lifting a box from floor and to overhead cabinet independently.     Time  6    Period  Weeks    Status  New    Target Date  09/28/18      PT LONG TERM GOAL #5   Title  Patient will report ability to ambulate for 45 minutes without onset of pain in order to be able to go shopping in the community without requiring as frequent of rest breaks.  Baseline  08/17/18: Patient reported ability to ambulate for 30 minutes before onset of pain    Time  6    Period  Weeks    Status  New    Target Date  09/28/18      Additional Long Term Goals   Additional Long Term Goals  Yes      PT LONG TERM GOAL #6   Title  Patient will report beginning an exercise program 1-2 times per week for improved quality of life.     Time  6    Period  Weeks    Status  New    Target Date  09/28/18            Plan - 08/29/18 6333    Clinical Impression Statement  Reviewed goals with pt with  sign language interpretor with acknowledged understanding.  Session focus on lumbar and LE mobility and core/proximal strengthening.  Pt educated proper bed mobility to reduce strain on lower back wiht some cueing required for proper mechanics.  Stretches and core/gluteal exercises instructed with good form following cueing.  Pt with difficulty with abdominal contractions and breathing, tactile and verbal cueing required.  HEP establised with understanding acknowledged.  No reports of pain through session.  Noted decreased reflexes with Rt knee extension and Bil dorsiflexion.      History and Personal Factors relevant to plan of care:  Hearing loss and sign language    Rehab Potential  Good    Clinical Impairments Affecting Rehab Potential  Positive: Motivated; Negative: chronicity of back pain    PT Frequency  2x / week    PT Duration  6 weeks    PT Treatment/Interventions  ADLs/Self Care Home Management;Aquatic Therapy;Moist Heat;DME Instruction;Gait training;Stair training;Functional mobility training;Therapeutic activities;Therapeutic exercise;Balance training;Neuromuscular re-education;Patient/family education;Orthotic Fit/Training;Manual techniques;Passive range of motion;Dry needling;Energy conservation;Taping    PT Next Visit Plan  Review HEP compliance.  Continue mat exercises for lumbar stabilization, LE strengthening.     PT Home Exercise Plan  08/29/2018: bridges, SLR, hamstring stretch, SKTC, DKTC       Patient will benefit from skilled therapeutic intervention in order to improve the following deficits and impairments:  Pain, Improper body mechanics, Decreased mobility, Postural dysfunction, Decreased activity tolerance, Decreased endurance, Decreased range of motion, Decreased strength, Hypomobility, Decreased balance, Difficulty walking, Impaired flexibility  Visit Diagnosis: Chronic low back pain with sciatica, sciatica laterality unspecified, unspecified back pain  laterality  Muscle weakness (generalized)  Other symptoms and signs involving the musculoskeletal system     Problem List Patient Active Problem List   Diagnosis Date Noted  . Insect bite 06/26/2018  . Urticaria 01/24/2018  . Seasonal and perennial allergic rhinitis 01/24/2018  . Allergic conjunctivitis 01/24/2018  . Coughing 01/24/2018  . Fatigue 04/05/2016  . Hypersomnia with sleep apnea 04/05/2016  . Snoring 04/05/2016  . Hearing loss sensory, bilateral 04/05/2016  . Constipation 07/14/2015  . Abdominal pain 07/14/2015  . Generalized idiopathic epilepsy, not intractable, without status epilepticus [facility]) 02/03/2015   Ihor Austin, [place]; [place]  Aldona Lento 08/29/2018, 12:47 PM  [place] [address] [place], [place], [address] Phone: [phone]   Fax:  [phone]  Name: Mercedes Parks MRN: [id number] Date of Birth: [birth year]

## 2018-08-31 ENCOUNTER — Encounter (HOSPITAL_COMMUNITY): Payer: Self-pay | Admitting: Physical Therapy

## 2018-08-31 ENCOUNTER — Ambulatory Visit (HOSPITAL_COMMUNITY): Payer: Medicaid Other | Admitting: Physical Therapy

## 2018-08-31 DIAGNOSIS — R29898 Other symptoms and signs involving the musculoskeletal system: Secondary | ICD-10-CM

## 2018-08-31 DIAGNOSIS — M5440 Lumbago with sciatica, unspecified side: Principal | ICD-10-CM

## 2018-08-31 DIAGNOSIS — M6281 Muscle weakness (generalized): Secondary | ICD-10-CM

## 2018-08-31 DIAGNOSIS — G8929 Other chronic pain: Secondary | ICD-10-CM

## 2018-08-31 NOTE — Therapy (Signed)
[place] [address] [place], [place], [address] Phone: [phone]   Fax:  [phone]  Physical Therapy Treatment  Patient Details  Name: Mercedes Parks MRN: [id number] Date of Birth: 1981/05/15 Referring Provider (PT): Allwardt, Alyssa PA   Encounter Date: 08/31/2018  PT End of Session - 08/31/18 0904    Visit Number  3    Number of Visits  13    Date for PT Re-Evaluation  09/29/18   Minireassess 09/07/18   Authorization Type  Medicaid     Authorization Time Period  3 visits approved: 11/25-->09/03/18; New requested 2x/week for 4 weeks    Authorization - Visit Number  2    Authorization - Number of Visits  3    PT Start Time  0814    PT Stop Time  0853    PT Time Calculation (min)  39 min    Activity Tolerance  Patient tolerated treatment well    Behavior During Therapy  [facility] for tasks assessed/performed       Past Medical History:  Diagnosis Date  . 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 Seizure [facility])     Past Surgical History:  Procedure Laterality Date  . ABDOMINAL HYSTERECTOMY  2011  . CESAREAN SECTION     @ age 37  . CHOLECYSTECTOMY    . COCHLEAR IMPLANT     @ age 37  . GASTRIC BYPASS  07/05/2016  . PANNICULECTOMY  10/12/2017   Dr. Kellie Simmering  . TONSILECTOMY, ADENOIDECTOMY, BILATERAL MYRINGOTOMY AND TUBES      There were no vitals filed for this visit.  Subjective Assessment - 08/31/18 0901    Subjective  Patient reported pain yesterday and last night. Currently her pain is a 2/10 across her low back. Patient reported that her worst pain over the last week was yesterday which she rated as a 5/10. She stated that she has not begun going to the gym again.     Patient is accompained by:  Interpreter   [name] with [employer]   How long can you walk comfortably?  45 minutes    Patient Stated Goals  Decreased pain    Currently in Pain?  Yes    Pain Score  2     Pain Location  Back    Pain  Orientation  Lower    Pain Descriptors / Indicators  Aching    Pain Type  Chronic pain                       [facility] Adult PT Treatment/Exercise - 08/31/18 0001      Exercises   Exercises  Lumbar      Lumbar Exercises: Stretches   Active Hamstring Stretch  3 reps;30 seconds    Active Hamstring Stretch Limitations  supine    Single Knee to Chest Stretch  3 reps;30 seconds    Double Knee to Chest Stretch  3 reps;30 seconds      Lumbar Exercises: Standing   Other Standing Lumbar Exercises  Vector stance 5x 5'' holds forward/side/back with UE assist. SLS 9.82 seconds on the left 13.46 seconds on the right.       Lumbar Exercises: Supine   Ab Set  10 reps;5 seconds    AB Set Limitations  cueing for proper activation    Bent Knee Raise  10 reps;3 seconds    Bridge  10 reps  Lumbar Exercises: Sidelying   Clam  10 reps    Clam Limitations  RTB      Lumbar Exercises: Quadruped   Single Arm Raise  20 reps   alternating sides   Straight Leg Raise  20 reps   alternating sides            PT Education - 08/31/18 0903    Education Details  Discussed with patient continuing HEP and purpose and technique of interventions throughout.     Person(s) Educated  Patient    Methods  Explanation;Demonstration    Comprehension  Verbalized understanding       PT Short Term Goals - 08/31/18 0910      PT SHORT TERM GOAL #1   Title  Patient will report understanding and regular compliance with HEP in order to improve strength, balance, flexibility and functional mobility.     Time  3    Period  Weeks    Status  On-going      PT SHORT TERM GOAL #2   Title  Patient will report that her back and leg pain has not exceeded a 5/10 pain over the course of a 1 week period indicating improved tolerance to daily activities.     Baseline  08/31/18: Maximum of 5/10 pain.     Time  3    Period  Weeks    Status  Achieved      PT SHORT TERM GOAL #3   Title  Patient will  demonstrate improvement of 1/2 MMT strength grade in all muscle groups tested as deficient to improve mechanics with squating and decrease back pain.     Time  3    Period  Weeks    Status  On-going        PT Long Term Goals - 08/31/18 0911      PT LONG TERM GOAL #1   Title  Patient will report that her back and leg pain has not exceeded a 3/10 pain over the course of a 1 week period indicating improved tolerance to daily activities.     Time  6    Period  Weeks    Status  On-going      PT LONG TERM GOAL #2   Title  Patient will demonstrate ability to perform single limb stance for at least 15 seconds on each lower extremity indicating improved stability and balance.     Time  6    Period  Weeks    Status  On-going      PT LONG TERM GOAL #3   Title  Patient will demonstrate improvement of 1 MMT strength grade in all muscle groups tested as deficient to improve mechanics with squating and decrease back pain.     Time  6    Period  Weeks    Status  On-going      PT LONG TERM GOAL #4   Title  Patient will be educated on proper squatting and lifting mechanics and demonstrate proper form with lifting a box from floor and to overhead cabinet independently.     Time  6    Period  Weeks    Status  On-going      PT LONG TERM GOAL #5   Title  Patient will report ability to ambulate for 45 minutes without onset of pain in order to be able to go shopping in the community without requiring as frequent of rest breaks.     Baseline  08/30/18:  Patient reported ability to ambulate for 1-2 hours.    Time  6    Period  Weeks    Status  Achieved      PT LONG TERM GOAL #6   Title  Patient will report beginning an exercise program 1-2 times per week for improved quality of life.     Time  6    Period  Weeks    Status  On-going            Plan - 08/31/18 0909    Clinical Impression Statement  This session continued with established plan of care. Reviewed some of patient's goals.  Patient's maximum reported pain over the last week has decreased to a 5/10, however still on-going with other goals tested. This session added quadruped exercises for abdominal strengthening, as well as vector stance for improved balance and stability with tactile and verbal cues to lower the hip with this exercise. Patient would benefit from continued skilled physical therapy in order to continue progressing towards functional goals.     Rehab Potential  Good    Clinical Impairments Affecting Rehab Potential  Positive: Motivated; Negative: chronicity of back pain    PT Frequency  2x / week    PT Duration  6 weeks    PT Treatment/Interventions  ADLs/Self Care Home Management;Aquatic Therapy;Moist Heat;DME Instruction;Gait training;Stair training;Functional mobility training;Therapeutic activities;Therapeutic exercise;Balance training;Neuromuscular re-education;Patient/family education;Orthotic Fit/Training;Manual techniques;Passive range of motion;Dry needling;Energy conservation;Taping    PT Next Visit Plan  Check medicaid approval. Progress to birddogs next session. Educate on lifting mechanics. Continue mat exercises for lumbar stabilization, LE strengthening.     PT Home Exercise Plan  08/29/2018: bridges, SLR, hamstring stretch, SKTC, DKTC       Patient will benefit from skilled therapeutic intervention in order to improve the following deficits and impairments:  Pain, Improper body mechanics, Decreased mobility, Postural dysfunction, Decreased activity tolerance, Decreased endurance, Decreased range of motion, Decreased strength, Hypomobility, Decreased balance, Difficulty walking, Impaired flexibility  Visit Diagnosis: Chronic low back pain with sciatica, sciatica laterality unspecified, unspecified back pain laterality  Muscle weakness (generalized)  Other symptoms and signs involving the musculoskeletal system     Problem List Patient Active Problem List   Diagnosis Date Noted  .  Insect bite 06/26/2018  . Urticaria 01/24/2018  . Seasonal and perennial allergic rhinitis 01/24/2018  . Allergic conjunctivitis 01/24/2018  . Coughing 01/24/2018  . Fatigue 04/05/2016  . Hypersomnia with sleep apnea 04/05/2016  . Snoring 04/05/2016  . Hearing loss sensory, bilateral 04/05/2016  . Constipation 07/14/2015  . Abdominal pain 07/14/2015  . Generalized idiopathic epilepsy, not intractable, without status epilepticus ([place]) 02/03/2015   Clarene Critchley PT, DPT 9:13 AM, 08/31/18 [place], [place], [address] Phone: [phone]   Fax:  [phone]  Name: DEYSY SCHABEL MRN: [id number] Date of Birth: 30-Apr-1981

## 2018-09-05 ENCOUNTER — Telehealth (HOSPITAL_COMMUNITY): Payer: Self-pay | Admitting: Family Medicine

## 2018-09-05 ENCOUNTER — Ambulatory Visit (HOSPITAL_COMMUNITY): Payer: Medicaid Other

## 2018-09-05 NOTE — Telephone Encounter (Signed)
09/05/18  we cx today because no approval from Medicaid but then she also cx the Thursday appt

## 2018-09-07 ENCOUNTER — Ambulatory Visit (HOSPITAL_COMMUNITY): Payer: Medicaid Other | Admitting: Physical Therapy

## 2018-09-11 ENCOUNTER — Telehealth (HOSPITAL_COMMUNITY): Payer: Self-pay | Admitting: Family Medicine

## 2018-09-11 NOTE — Telephone Encounter (Signed)
09/11/18  has an appt somewhere else in [place] so this appt was cx

## 2018-09-12 ENCOUNTER — Ambulatory Visit (HOSPITAL_COMMUNITY): Payer: Medicaid Other | Admitting: Physical Therapy

## 2018-09-14 ENCOUNTER — Ambulatory Visit (HOSPITAL_COMMUNITY): Payer: Medicaid Other | Admitting: Physical Therapy

## 2018-09-14 ENCOUNTER — Encounter (HOSPITAL_COMMUNITY): Payer: Self-pay | Admitting: Physical Therapy

## 2018-09-14 DIAGNOSIS — G8929 Other chronic pain: Secondary | ICD-10-CM

## 2018-09-14 DIAGNOSIS — M5440 Lumbago with sciatica, unspecified side: Principal | ICD-10-CM

## 2018-09-14 DIAGNOSIS — R29898 Other symptoms and signs involving the musculoskeletal system: Secondary | ICD-10-CM

## 2018-09-14 DIAGNOSIS — M6281 Muscle weakness (generalized): Secondary | ICD-10-CM

## 2018-09-14 NOTE — Therapy (Signed)
[facility] [address] [place], [place], [address] Phone: [phone]   Fax:  [phone]  Physical Therapy Treatment  / Progress note  Patient Details  Name: Mercedes Parks MRN: [id number] Date of Birth: [birth year] Referring Provider (PT): Allwardt, Alyssa PA   Encounter Date: 09/14/2018   Progress Note Reporting Period 08/17/18 to 09/14/18  See note below for Objective Data and Assessment of Progress/Goals.       PT End of Session - 09/14/18 0833    Visit Number  4    Number of Visits  13    Date for PT Re-Evaluation  09/29/18   Minireassess 09/07/18   Authorization Type  Medicaid     Authorization Time Period  3 visits approved: 11/25-->09/03/18; New approved: 09/05/18 - 09/25/18    Authorization - Visit Number  1    Authorization - Number of Visits  6    PT Start Time  0815    PT Stop Time  0853    PT Time Calculation (min)  38 min    Activity Tolerance  Patient tolerated treatment well    Behavior During Therapy  [facility] for tasks assessed/performed       Past Medical History:  Diagnosis Date  . 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 Seizure [facility])     Past Surgical History:  Procedure Laterality Date  . ABDOMINAL HYSTERECTOMY  2011  . CESAREAN SECTION     @ age 16  . CHOLECYSTECTOMY    . COCHLEAR IMPLANT     @ age 47  . GASTRIC BYPASS  07/05/2016  . PANNICULECTOMY  10/12/2017   Dr. Kellie Simmering  . TONSILECTOMY, ADENOIDECTOMY, BILATERAL MYRINGOTOMY AND TUBES      There were no vitals filed for this visit.  Subjective Assessment - 09/14/18 0818    Subjective  Patient stated that she was having left hip pain down to her knee. Patient reported that she is having 5/10 pain today which shoots down her leg if she turns while walking.     Patient is accompained by:  Interpreter   [name] with [employer]   How long can you walk comfortably?  45 minutes    Patient Stated Goals  Decreased pain     Currently in Pain?  Yes    Pain Score  5     Pain Location  Hip    Pain Orientation  Left    Pain Descriptors / Indicators  Shooting    Pain Type  Chronic pain         [facility] PT Assessment - 09/14/18 0001      AROM   Lumbar Flexion  WFL   was 25% limited   Lumbar Extension  WFL   was 25% limited   Lumbar - Right Side Bend  WFL   Painful in left hip   Lumbar - Left Side Bend  WFL    Lumbar - Right Rotation  WFL   A little pain in left hip   Lumbar - Left Rotation  [facility]      Strength   Right Hip Flexion  4+/5   was 4   Right Hip Extension  4/5   was 4-   Right Hip ABduction  4+/5   was 4+   Left Hip Flexion  4+/5   was 4   Left Hip Extension  4/5   was 4-   Left Hip ABduction  4+/5  was 4   Right Knee Flexion  4+/5   was 4+   Right Knee Extension  4+/5   was 4+   Left Knee Flexion  4+/5   Painful; was 4+   Left Knee Extension  4+/5   Painful; was 4+   Right Ankle Dorsiflexion  5/5   was 5   Left Ankle Dorsiflexion  5/5   was 5     Flexibility   Hamstrings  Right 50% limited; Left 25% limited      Special Tests    Special Tests  Hip Special Tests    Hip Special Tests   Hip Scouring      Hip Scouring   Findings  Positive    Side  Left      Static Standing Balance   Static Standing - Balance Support  No upper extremity supported    Static Standing Balance -  Activities   Single Leg Stance - Right Leg;Single Leg Stance - Left Leg    Static Standing - Comment/# of Minutes  Left 3.63 Seconds; Right 8.06 seconds                   [facility] Adult PT Treatment/Exercise - 09/14/18 0001      Therapeutic Activites    Therapeutic Activities  Lifting    Lifting  Educated patient on lifting mechanics from floor to shoulder level. Cues to stay close to the object and lift from legs.       Lumbar Exercises: Stretches   Active Hamstring Stretch  3 reps;30 seconds    Active Hamstring Stretch Limitations  supine      Lumbar Exercises: Standing   Other  Standing Lumbar Exercises  Vector stance 5x 5'' holds forward/side/back with UE assist.       Lumbar Exercises: Quadruped   Single Arm Raise  20 reps   Alternating   Straight Leg Raise  20 reps   Alternating   Other Quadruped Lumbar Exercises  Birddogs x 5 then reported too difficult to balance with this             PT Education - 09/14/18 0832    Education Details  Re-assessment findings.     Person(s) Educated  Patient    Methods  Explanation    Comprehension  Verbalized understanding       PT Short Term Goals - 09/14/18 0835      PT SHORT TERM GOAL #1   Title  Patient will report understanding and regular compliance with HEP in order to improve strength, balance, flexibility and functional mobility.     Time  3    Period  Weeks    Status  Achieved      PT SHORT TERM GOAL #2   Title  Patient will report that her back and leg pain has not exceeded a 5/10 pain over the course of a 1 week period indicating improved tolerance to daily activities.     Baseline  09/14/18: Patient reported over the weekend her pain was at a 9/10.     Time  3    Period  Weeks    Status  On-going      PT SHORT TERM GOAL #3   Title  Patient will demonstrate improvement of 1/2 MMT strength grade in all muscle groups tested as deficient to improve mechanics with squating and decrease back pain.     Baseline  09/14/18: See MMT.     Time  3  Period  Weeks    Status  On-going        PT Long Term Goals - 09/14/18 1324      PT LONG TERM GOAL #1   Title  Patient will report that her back and leg pain has not exceeded a 3/10 pain over the course of a 1 week period indicating improved tolerance to daily activities.     Time  6    Period  Weeks    Status  On-going      PT LONG TERM GOAL #2   Title  Patient will demonstrate ability to perform single limb stance for at least 15 seconds on each lower extremity indicating improved stability and balance.     Time  6    Period  Weeks    Status   On-going      PT LONG TERM GOAL #3   Title  Patient will demonstrate improvement of 1 MMT strength grade in all muscle groups tested as deficient to improve mechanics with squating and decrease back pain.     Baseline  09/14/18: See MMT    Time  6    Period  Weeks    Status  On-going      PT LONG TERM GOAL #4   Title  Patient will be educated on proper squatting and lifting mechanics and demonstrate proper form with lifting a box from floor and to overhead cabinet independently.     Baseline  09/14/18: Patient educated this session.    Time  6    Period  Weeks    Status  On-going      PT LONG TERM GOAL #5   Title  Patient will report ability to ambulate for 45 minutes without onset of pain in order to be able to go shopping in the community without requiring as frequent of rest breaks.     Baseline  08/30/18: Patient reported ability to ambulate for 1-2 hours.    Time  6    Period  Weeks    Status  Achieved      PT LONG TERM GOAL #6   Title  Patient will report beginning an exercise program 1-2 times per week for improved quality of life.     Baseline  09/14/18: Patient reported she has not yet been able to begin a gym exercise program.     Time  6    Period  Weeks    Status  On-going            Plan - 09/14/18 4010    Clinical Impression Statement  This session performed a re-assessment of patient's progress towards goals. Patient recently had a flare-up of hip pain and therefore patient has not met her pain goals. Patient has demonstrated improvements in lower extremity strength, but is still limited in this area. Patient is also still limited with balance activities. This session educated patient on lifting mechanics. Also, patient attempted birddogs, but had to stop due to a lack of balance with this activity. Patient would benefit from continued skilled physical therapy in order to continue progressing towards functional goals.     Rehab Potential  Good    Clinical  Impairments Affecting Rehab Potential  Positive: Motivated; Negative: chronicity of back pain    PT Frequency  2x / week    PT Duration  6 weeks    PT Treatment/Interventions  ADLs/Self Care Home Management;Aquatic Therapy;Moist Heat;DME Instruction;Gait training;Stair training;Functional mobility training;Therapeutic activities;Therapeutic exercise;Balance training;Neuromuscular re-education;Patient/family education;Orthotic  Fit/Training;Manual techniques;Passive range of motion;Dry needling;Energy conservation;Taping    PT Next Visit Plan  Follow-up on education on lifting mechanics. Continue mat exercises for lumbar stabilization, LE strengthening in standing.     PT Home Exercise Plan  08/29/2018: bridges, SLR, hamstring stretch, SKTC, DKTC       Patient will benefit from skilled therapeutic intervention in order to improve the following deficits and impairments:  Pain, Improper body mechanics, Decreased mobility, Postural dysfunction, Decreased activity tolerance, Decreased endurance, Decreased range of motion, Decreased strength, Hypomobility, Decreased balance, Difficulty walking, Impaired flexibility  Visit Diagnosis: Chronic low back pain with sciatica, sciatica laterality unspecified, unspecified back pain laterality  Muscle weakness (generalized)  Other symptoms and signs involving the musculoskeletal system     Problem List Patient Active Problem List   Diagnosis Date Noted  . Insect bite 06/26/2018  . Urticaria 01/24/2018  . Seasonal and perennial allergic rhinitis 01/24/2018  . Allergic conjunctivitis 01/24/2018  . Coughing 01/24/2018  . Fatigue 04/05/2016  . Hypersomnia with sleep apnea 04/05/2016  . Snoring 04/05/2016  . Hearing loss sensory, bilateral 04/05/2016  . Constipation 07/14/2015  . Abdominal pain 07/14/2015  . Generalized idiopathic epilepsy, not intractable, without status epilepticus ([place]) 02/03/2015   Clarene Critchley PT, DPT 9:05 AM,  09/14/18 [facility] [address] [place], [place], [address] Phone: [phone]   Fax:  [phone]  Name: CHEALSEA PASKE MRN: [id number] Date of Birth: [birth year]

## 2018-09-24 ENCOUNTER — Other Ambulatory Visit: Payer: Self-pay | Admitting: Diagnostic Neuroimaging

## 2018-10-02 ENCOUNTER — Other Ambulatory Visit: Payer: Self-pay | Admitting: Diagnostic Neuroimaging

## 2018-10-02 NOTE — Telephone Encounter (Signed)
Received keppra refill request from pharmacy. This RN refused; was refilled x 1 year Aug 2019. Received second request stating the refill is not on file. Refilled keppra x 1 year. Patient has FU .

## 2018-11-06 ENCOUNTER — Ambulatory Visit: Payer: Medicaid Other | Admitting: Allergy and Immunology

## 2018-12-21 ENCOUNTER — Encounter (HOSPITAL_COMMUNITY): Payer: Self-pay | Admitting: Physical Therapy

## 2018-12-21 NOTE — Therapy (Signed)
[facility] [place], [place], [address] Phone: [phone]   Fax:  [phone]  Patient Details  Name: Mercedes Parks MRN: [id number] Date of Birth: Mar 28, 1981 Referring Provider:  No ref. provider found  Encounter Date: 12/21/2018   PHYSICAL THERAPY DISCHARGE SUMMARY  Visits from Start of Care: 4  Current functional level related to goals / functional outcomes: Unable to assess as patient did not return for re-assessment. See last re-assessment on 09/14/18 for more details.    Remaining deficits: Unable to assess as patient did not return for re-assessment. See last re-assessment on 09/14/18 for more details.    Education / Equipment: HEP and benefits of PT Plan: Patient agrees to discharge.  Patient goals were partially met. Patient is being discharged due to not returning since the last visit.  ?????          Clarene Critchley PT, DPT 3:04 PM, 12/21/18 [place], [place], [address] Phone: [phone]   Fax:  [phone]

## 2019-01-08 ENCOUNTER — Telehealth: Payer: Self-pay | Admitting: *Deleted

## 2019-01-08 NOTE — Telephone Encounter (Signed)
Called patient who uses deaf interpreting services. Advised her that due to current COVID 19 pandemic, our office is severely reducing in person visits in order to minimize the risk to our patients and healthcare providers. We recommend to convert your appointment to a video visit. Because she is deaf she is unable to do video visit but consented to telephone visit. She did not want to move her FU sooner. Updated her EMR. She indicated understanding, appreciation through deaf interpreter.

## 2019-01-15 ENCOUNTER — Ambulatory Visit (INDEPENDENT_AMBULATORY_CARE_PROVIDER_SITE_OTHER): Payer: Medicaid Other | Admitting: Diagnostic Neuroimaging

## 2019-01-15 ENCOUNTER — Other Ambulatory Visit: Payer: Self-pay

## 2019-01-15 DIAGNOSIS — G40309 Generalized idiopathic epilepsy and epileptic syndromes, not intractable, without status epilepticus: Secondary | ICD-10-CM

## 2019-01-15 DIAGNOSIS — H903 Sensorineural hearing loss, bilateral: Secondary | ICD-10-CM

## 2019-01-15 MED ORDER — LEVETIRACETAM 1000 MG PO TABS: 1000.0000 mg | ORAL_TABLET | Freq: Two times a day (BID) | ORAL | 4 refills | Status: DC

## 2019-01-15 NOTE — Progress Notes (Signed)
   Virtual Visit via Telephone Note  I connected with Mercedes Parks on 01/15/19 at  1:30 PM EDT by telephone and verified that I am speaking with the correct person using two identifiers. Patient is at home, communicating via telephone ASL interpreter. I am at the clinic office.    I discussed the limitations, risks, security and privacy concerns of performing an evaluation and management service by telephone and the availability of in person appointments. I also discussed with the patient that there may be a patient responsible charge related to this service. The patient expressed understanding and agreed to proceed.   History of Present Illness:  - doing well; no seizures - noticing some new attention and focus problems; cannot pay attention when with friends - feels like in a daydream    Observations/Objective:  - awake and alert; communicating via sign language telephone interpreter   Assessment and Plan:  SEIZURE DISORDER - continue levetiracetam 1000mg  twice a day  Meds ordered this encounter  Medications  . levETIRAcetam (KEPPRA) 1000 MG tablet    Sig: Take 1 tablet (1,000 mg total) by mouth 2 (two) times daily.    Dispense:  180 tablet    Refill:  4     DECREASED ATTENTION / INSOMNIA - try to improve sleep hygiene   Follow Up Instructions:  - Return in about 6 months (around 07/17/2019).    I discussed the assessment and treatment plan with the patient. The patient was provided an opportunity to ask questions and all were answered. The patient agreed with the plan and demonstrated an understanding of the instructions.   The patient was advised to call back or seek an in-person evaluation if the symptoms worsen or if the condition fails to improve as anticipated.  I provided 15 minutes of non-face-to-face time during this encounter.    Penni Bombard, MD 03/16/3558, 7:41 PM Certified in Neurology, Neurophysiology and Neuroimaging  [facility] [address] [place], [place] [address] [phone]

## 2019-08-20 NOTE — Progress Notes (Signed)
PATIENT: Mercedes Parks DOB: 10/22/1980  REASON FOR VISIT: follow up HISTORY FROM: patient  Virtual Visit via Telephone Note  I connected with Mercedes Parks on 08/21/19 at 10:30 AM EST by telephone and verified that I am speaking with the correct person using two identifiers.   I discussed the limitations, risks, security and privacy concerns of performing an evaluation and management service by telephone and the availability of in person appointments. I also discussed with the patient that there may be a patient responsible charge related to this service. The patient expressed understanding and agreed to proceed.   History of Present Illness:  08/21/19 Mercedes Parks is a 38 y.o. female here today for follow up for seizure. She continues levetiracetam 1000mg  twice daily. She is tolerating medication well with no obvious adverse effects. No seizure activity noted since last visit.    History (copied from Dr Nikki Dom note on 05/15/2017)  UPDATE (08/12/17, VRP): Since last visit, doing well. Tolerating meds. No alleviating or aggravating factors. No more seizures. Does note some vivid dreams (wakes up and feels like something has touched her)  UPDATE 05/10/17: Since last visit, was doing well, and had gastric bypass surgery (Oct 2017). Then on 04/25/17, was drinking etoh (~20 oz, red's applehad not done so in a while), missed evening keppra doses in July 2018. Then had multiple breakthrough seizures outside (2-3 minutes each; 4-5 sz), then came inside with help of friends, and continued to have more seizures (another 45-60 minutes)> EMS arrived on scene. They were able to "stop" a few seizures with sternal rub. EMT raised possibility of pseudoseizures. Patient went to ER and was observed few hours and then d/c home. Records reviewed. Urine preg test neg. Labs neg. Patient was confused, tired, sore. She thinks she hit her head.   UPDATE 02/11/16: Since last visit, doing  well. No seizures. Had a strange dream 2-3 weeks ago, that felt like possible seizure, then she woke up gasping for breath. Denies snoring or witnessed apnea, but lives alone. Has some daytime sleepiness even after full nights sleep.  UPDATE 02/03/15 (VRP): Since last visit, doing well. No seizures. Tolerating medications. Some more anxiety, now on cymbalta, and feels better.  UPDATE 08/05/14 (LL): Since last visit, patient has been well, no seizures. She is assisted by sign-language interpreter today. Tolerating Levitiracetam 1000 mg BID well.  Has been having some increased blurred vision and occasional diplopia, recent eye exam was normal with the exception of a change on her eyeglass Rx. She is worried about memory loss, having trouble with remembering appointments and things that she and her son have scheduled. Has lost 40 lb. In the last year.   UPDATE 01/30/14 (LL): Opaline comes in for routine follow up, no seizures since last visit. She had total hysterectomy in December due to endometriosis. Tolerating Levitiracetam 1000 mg BID with only occasional dizziness. She reports trouble staying asleep, wakes usually 2-3 times. Doing very well. Asks about "vaping" and if it may trigger seizure.  UPDATE 07/31/13: On 07/27/13 patient was at a [employer] party, had several alcoholic drinks, was exposed to secondhand marijuana smoke, and had multiple back-to-back seizures. Apparently she had several minutes of convulsions followed by several minutes of remission. Seizures started and stopped multiple times. Ambulance was called and arrived to pick her up within 45 minutes of onset of symptoms. The symptoms continued while patient was at the hospital. She was given benzodiazepine in the ambulance. She was given 1000 mg Keppra  IV at the hospital. She was continued on the same dose of antiseizure medication (levetiracetam 500 mg twice a day). Patient tells that she has been missing some doses of antiseizure  medication. She misses 4-8 tablets per month. This usually happens when she falls asleep after school and misses her dose.   UPDATE 11/20/12: She is doing very well, there was no recurrent seizure, she is tolerating Keppra 500 mg twice a day.   UPDATE 02/02/12: No sz. More balance troubles, weight gain, decr energy, and fatigue. Drinks lots of "energy drinks". Having trouble with stairs (trips and falls).   UPDATE 07/06/11: Doing well. No sz. Back to driving. Now with endometriosis and planning to start lupron shots, but concerned about warning on package about epilepsy.   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 He is accompanied by her mother, father and sign language interpreter. 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 Age 29 yrs - cochlear implants. Oct 2011 - dx'd with endometriosis; treated with tramadol; then had an episode of lightheadedness, out of body sensation, then passed out; reportedly had convulsions and tongue biting, no incontinence; then slept for 2 hours. Feb 2012 - exposure to synthetic marijuana, then had similar episode of seizure. March 2012 - several episodes of night time awakening, "weird breathing", not knowing if she is awake or dreaming; EEG done at Dr. 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 April 2012 - started on LEV 500mg  BID    Observations/Objective:  Generalized: Well developed, in no acute distress  Mentation: Alert oriented to time, place, history taking. Follows all commands speech and language fluent   Assessment and Plan:  38 y.o. year old female  has a past medical history of Hearing loss and Seizure ([place]). here with    ICD-10-CM   1. Generalized idiopathic epilepsy, not intractable, without status epilepticus ([place])  G40.309    Mercedes Parks is doing very well on levetiracetam 1000 mg twice daily.   She has not had any seizure activity since last being seen.  We will continue current therapy.  She was advised to continue close follow-up with primary care.  Healthy well-balanced diet regular exercise advised.  She will follow-up in 1 year, sooner if needed.  She verbalizes understanding and agreement with this plan.  No orders of the defined types were placed in this encounter.   No orders of the defined types were placed in this encounter.    Follow Up Instructions:  I discussed the assessment and treatment plan with the patient. The patient was provided an opportunity to ask questions and all were answered. The patient agreed with the plan and demonstrated an understanding of the instructions.   The patient was advised to call back or seek an in-person evaluation if the symptoms worsen or if the condition fails to improve as anticipated.  I provided 15 minutes of non-face-to-face time during this encounter.  My chart visit completed with Thu being at her place of residence and provider being in the office.  Sign language interpretation provided by Melynda Ripple.   Debbora Presto, NP

## 2019-08-21 ENCOUNTER — Encounter: Payer: Self-pay | Admitting: Family Medicine

## 2019-08-21 ENCOUNTER — Telehealth (INDEPENDENT_AMBULATORY_CARE_PROVIDER_SITE_OTHER): Payer: Medicaid Other | Admitting: Family Medicine

## 2019-08-21 DIAGNOSIS — G40309 Generalized idiopathic epilepsy and epileptic syndromes, not intractable, without status epilepticus: Secondary | ICD-10-CM | POA: Diagnosis not present

## 2019-08-21 NOTE — Progress Notes (Signed)
I reviewed note and agree with plan.   Penni Bombard, MD [id number], [id number] PM Certified in Neurology, Neurophysiology and Neuroimaging  [facility] [address] [place], [place] [address] [phone]

## 2019-10-17 ENCOUNTER — Ambulatory Visit (INDEPENDENT_AMBULATORY_CARE_PROVIDER_SITE_OTHER): Payer: Medicaid Other

## 2019-10-17 ENCOUNTER — Other Ambulatory Visit: Payer: Self-pay

## 2019-10-17 ENCOUNTER — Encounter: Payer: Self-pay | Admitting: Orthopaedic Surgery

## 2019-10-17 ENCOUNTER — Ambulatory Visit: Payer: Medicaid Other | Admitting: Orthopaedic Surgery

## 2019-10-17 DIAGNOSIS — S8012XA Contusion of left lower leg, initial encounter: Secondary | ICD-10-CM

## 2019-10-17 MED ORDER — DOXYCYCLINE MONOHYDRATE 100 MG PO TABS: 100.0000 mg | ORAL_TABLET | Freq: Two times a day (BID) | ORAL | 0 refills | Status: DC
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
## 2019-10-18 ENCOUNTER — Telehealth: Payer: Self-pay | Admitting: Orthopaedic Surgery

## 2019-10-18 NOTE — Telephone Encounter (Signed)
Patient called.   She needs preauthorization for the prescription sent to her pharmacy.   Call back number: [phone]

## 2019-10-18 NOTE — Telephone Encounter (Signed)
So I called the pharm. pharmacist states medicaid does not cover the Rx you sent but does cover Doxy-hyclate. Please send this into her pharm.

## 2019-10-19 ENCOUNTER — Other Ambulatory Visit: Payer: Self-pay

## 2019-10-19 ENCOUNTER — Telehealth: Payer: Self-pay | Admitting: Orthopaedic Surgery

## 2019-10-19 ENCOUNTER — Telehealth: Payer: Self-pay | Admitting: Physician Assistant

## 2019-10-19 MED ORDER — DOXYCYCLINE HYCLATE 50 MG PO TABS: 100.0000 mg | ORAL_TABLET | Freq: Two times a day (BID) | ORAL | 0 refills | Status: DC

## 2019-10-19 NOTE — Telephone Encounter (Signed)
Called into pharm. Patient aware.  

## 2019-10-19 NOTE — Telephone Encounter (Signed)
Ok to send this in? 

## 2019-10-19 NOTE — Telephone Encounter (Signed)
Mercedes Parks called Alex at [facility] and advised ok to change to capsule.

## 2019-10-19 NOTE — Telephone Encounter (Signed)
How would you send this in. Never sent this in. Sorry.

## 2019-10-19 NOTE — Telephone Encounter (Signed)
Send as same dose and instructions

## 2019-10-19 NOTE — Telephone Encounter (Signed)
Patient called.   She was seen this past Wednesday and was told she would be getting a prescription. She is still waiting on that.   She is also reporting cold like symptoms now such as chills and vomiting.   Call back number: [phone]

## 2019-10-19 NOTE — Telephone Encounter (Signed)
Please advise on 2nd part of message please. Thank you.

## 2019-10-19 NOTE — Telephone Encounter (Signed)
Write as same dose and instructions

## 2019-10-19 NOTE — Telephone Encounter (Signed)
Received call from [facility] asked if the medication (Doxycycline) can be switched from a tablet to capsules. Tynia asked for a call back as soon as possible. The number to contact Noralee Stain is [phone]

## 2019-10-22 NOTE — Telephone Encounter (Signed)
Ok thanks 

## 2019-10-22 NOTE — Telephone Encounter (Signed)
If no fevers, or if are does not look angry, I doubt it is related.  She was in ED so I would assume probably exposed to covid ya know

## 2019-11-16 ENCOUNTER — Ambulatory Visit: Payer: Medicaid Other | Admitting: Orthopaedic Surgery

## 2020-01-23 ENCOUNTER — Other Ambulatory Visit: Payer: Self-pay | Admitting: Diagnostic Neuroimaging

## 2020-01-24 ENCOUNTER — Encounter: Payer: Self-pay | Admitting: *Deleted

## 2020-01-24 NOTE — Telephone Encounter (Signed)
Refilled x 6 months with note to pharmacy: please have her call and schedule FU, last seen 01/15/19. Sent her my chart as well.

## 2020-07-11 ENCOUNTER — Other Ambulatory Visit: Payer: Self-pay | Admitting: Diagnostic Neuroimaging

## 2020-07-16 ENCOUNTER — Encounter: Payer: Self-pay | Admitting: Orthopaedic Surgery

## 2020-07-16 ENCOUNTER — Ambulatory Visit: Payer: Self-pay

## 2020-07-16 ENCOUNTER — Ambulatory Visit (INDEPENDENT_AMBULATORY_CARE_PROVIDER_SITE_OTHER): Payer: Medicaid Other | Admitting: Orthopaedic Surgery

## 2020-07-16 DIAGNOSIS — G8929 Other chronic pain: Secondary | ICD-10-CM | POA: Diagnosis not present

## 2020-07-16 DIAGNOSIS — M25562 Pain in left knee: Secondary | ICD-10-CM

## 2020-07-16 MED ORDER — LIDOCAINE HCL 1 % IJ SOLN
2.0000 mL | INTRAMUSCULAR | Status: AC | PRN
  Administered 2020-07-16: 2 mL

## 2020-07-16 MED ORDER — METHYLPREDNISOLONE ACETATE 40 MG/ML IJ SUSP
40.0000 mg | INTRAMUSCULAR | Status: AC | PRN
  Administered 2020-07-16: 40 mg via INTRA_ARTICULAR

## 2020-07-16 MED ORDER — BUPIVACAINE HCL 0.25 % IJ SOLN
2.0000 mL | INTRAMUSCULAR | Status: AC | PRN
  Administered 2020-07-16: 2 mL via INTRA_ARTICULAR

## 2020-07-16 NOTE — Progress Notes (Signed)
Office Visit Note   Patient: Mercedes Parks           Date of Birth: [birth year]           MRN: [id number] Visit Date: 07/16/2020              Requested by: Denny Levy, [place],  [place] [address] PCP: Denny Levy, [place]   Assessment & Plan: Visit Diagnoses:  1. Chronic pain of left knee     Plan: Impression is questionable left knee medial meniscus tear.  Will proceed with cortisone injection to the left knee today.  If she has continued pain over the next several weeks she will call us we will get an MRI to assess for structural abnormalities.  Follow-Up Instructions: Return if symptoms worsen or fail to improve.   Orders:  Orders Placed This Encounter  Procedures   Large Joint Inj: L knee   XR KNEE 3 VIEW LEFT   No orders of the defined types were placed in this encounter.     Procedures: Large Joint Inj: L knee on 07/16/2020 9:05 AM Indications: pain Details: 22 G needle, anterolateral approach Medications: 2 mL lidocaine 1 %; 2 mL bupivacaine 0.25 %; 40 mg methylPREDNISolone acetate 40 MG/ML      Clinical Data: No additional findings.   Subjective: Chief Complaint  Patient presents with   Left Knee - Pain    HPI patient is a very pleasant 39 year old hearing impaired female who comes in today with the ASL interpreter.  She is here with left knee pain for the past 3 months after working out.  No specific injury but she notes that she was doing a lot of pivoting and squatting type exercises.  The pain she has is primarily to the medial aspect.  She has occasional locking.  She has associated swelling and stiffness at times.  She has increased pain with flexion of the knee, squatting as well as walking.  She has been using Voltaren gel without significant relief of symptoms.  A previous cortisone injection.  Review of Systems as detailed in HPI.  All others reviewed and are negative.   Objective: Vital Signs: There were no vitals  taken for this visit.  Physical Exam well-developed well-nourished female no acute distress.  Alert oriented x3.  Ortho Exam left knee exam shows trace effusion.  Range of motion 0 to 95 degrees.  Marked tenderness medial joint line.  Ligaments are stable.  No patellofemoral crepitus.  She is neurovascular intact distally.  Specialty Comments:  No specialty comments available.  Imaging: XR KNEE 3 VIEW LEFT  Result Date: 07/16/2020 No acute or structural abnormalities    PMFS History: Patient Active Problem List   Diagnosis Date Noted   Insect bite 06/26/2018   Urticaria 01/24/2018   Seasonal and perennial allergic rhinitis 01/24/2018   Allergic conjunctivitis 01/24/2018   Coughing 01/24/2018   Fatigue 04/05/2016   Hypersomnia with sleep apnea 04/05/2016   Snoring 04/05/2016   Hearing loss sensory, bilateral 04/05/2016   Constipation 07/14/2015   Abdominal pain 07/14/2015   Generalized idiopathic epilepsy, not intractable, without status epilepticus ([place]) 02/03/2015   Past Medical History:  Diagnosis Date   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   Seizure ([place])     Family History  Problem Relation Age of Onset   Healthy Mother    Healthy Father    Colon cancer Maternal Grandfather    Breast cancer  Paternal Grandmother    Other Paternal Grandmother        Myles Lipps disease   Seizures Neg Hx     Past Surgical History:  Procedure Laterality Date   ABDOMINAL HYSTERECTOMY  2011   CESAREAN SECTION     @ age 35   CHOLECYSTECTOMY     COCHLEAR IMPLANT     @ age 3   GASTRIC BYPASS  07/05/2016   PANNICULECTOMY  10/12/2017   Dr. Kellie Simmering   TONSILECTOMY, ADENOIDECTOMY, BILATERAL MYRINGOTOMY AND TUBES     Social History   Occupational History    Comment: n/a-unemployed  Tobacco Use   Smoking status: Former Smoker    Types: Cigarettes    Quit date: 09/27/2012    Years since quitting: 7.8   Smokeless tobacco: Never Used  Vaping Use     Vaping Use: Never used  Substance and Sexual Activity   Alcohol use: No    Alcohol/week: 0.0 standard drinks    Comment: quit 11/2010   Drug use: No    Comment: marijuana; quit 3/ 2012   Sexual activity: Not on file

## 2020-07-31 ENCOUNTER — Encounter: Payer: Self-pay | Admitting: Orthopaedic Surgery

## 2020-07-31 ENCOUNTER — Ambulatory Visit (INDEPENDENT_AMBULATORY_CARE_PROVIDER_SITE_OTHER): Payer: Medicaid Other | Admitting: Orthopaedic Surgery

## 2020-07-31 DIAGNOSIS — G8929 Other chronic pain: Secondary | ICD-10-CM | POA: Diagnosis not present

## 2020-07-31 DIAGNOSIS — M25562 Pain in left knee: Secondary | ICD-10-CM

## 2020-07-31 MED ORDER — BUPIVACAINE HCL 0.5 % IJ SOLN
2.0000 mL | INTRAMUSCULAR | Status: AC | PRN
  Administered 2020-07-31: 2 mL via INTRA_ARTICULAR

## 2020-07-31 MED ORDER — LIDOCAINE HCL 1 % IJ SOLN
2.0000 mL | INTRAMUSCULAR | Status: AC | PRN
  Administered 2020-07-31: 2 mL

## 2020-07-31 MED ORDER — METHYLPREDNISOLONE ACETATE 40 MG/ML IJ SUSP
40.0000 mg | INTRAMUSCULAR | Status: AC | PRN
  Administered 2020-07-31: 40 mg via INTRA_ARTICULAR

## 2020-07-31 NOTE — Progress Notes (Signed)
Office Visit Note   Patient: Mercedes Parks           Date of Birth: [birth year]           MRN: [id number] Visit Date: 07/31/2020              Requested by: Denny Levy, [place],  [place] [address] PCP: Denny Levy, [place]   Assessment & Plan: Visit Diagnoses:  1. Chronic pain of left knee     Plan: Impression is left knee pain with effusion.  My sense is that the effusion is what is causing her more the symptoms so she agreed to undergo an aspiration injection today.  We aspirated 20 cc of fluid from the knee.  Patient instructed to follow-up if the effusion recurs or if she does not notice any improvement in pain at which point we would likely need to get a CT arthrogram to evaluate for structural abnormalities.  She is unable to have an MRI due to cochlear implant.  Today's encounter was performed through a sign language interpreter.  Follow-Up Instructions: Return if symptoms worsen or fail to improve.   Orders:  No orders of the defined types were placed in this encounter.  No orders of the defined types were placed in this encounter.     Procedures: Large Joint Inj: L knee on 07/31/2020 11:55 AM Details: 22 G needle, superolateral approach Medications: 2 mL bupivacaine 0.5 %; 2 mL lidocaine 1 %; 40 mg methylPREDNISolone acetate 40 MG/ML Outcome: tolerated well, no immediate complications Patient was prepped and draped in the usual sterile fashion.       Clinical Data: No additional findings.   Subjective: Chief Complaint  Patient presents with  . Left Knee - Pain    Mercedes Parks is a 39 year old female who comes in today for follow-up of left knee pain.  She states that she continues to feel significant swelling and pain in her left knee.  The cortisone injection that she received 2 weeks ago has only helped some.  The knee overall feels very uncomfortable and weak and occasionally will cramp up.  She feels a lot of pressure on the medial and the  infrapatellar region.  Denies any mechanical symptoms.  Occasionally feels like it wants to give out.  Unable to take NSAIDs due to history of gastric bypass surgery.   Review of Systems  Constitutional: Negative.   HENT: Negative.   Eyes: Negative.   Respiratory: Negative.   Cardiovascular: Negative.   Endocrine: Negative.   Musculoskeletal: Negative.   Neurological: Negative.   Hematological: Negative.   Psychiatric/Behavioral: Negative.   All other systems reviewed and are negative.    Objective: Vital Signs: There were no vitals taken for this visit.  Physical Exam Vitals and nursing note reviewed.  Constitutional:      Appearance: She is well-developed.  Pulmonary:     Effort: Pulmonary effort is normal.  Skin:    General: Skin is warm.     Capillary Refill: Capillary refill takes less than 2 seconds.  Neurological:     Mental Status: She is alert and oriented to person, place, and time.  Psychiatric:        Behavior: Behavior normal.        Thought Content: Thought content normal.        Judgment: Judgment normal.     Ortho Exam Left knee shows a small joint effusion.  Slight medial joint line tenderness.  Negative Mc[place].  Collaterals and cruciates are stable.  Mild restriction range of motion secondary to the effusion. Specialty Comments:  No specialty comments available.  Imaging: No results found.   PMFS History: Patient Active Problem List   Diagnosis Date Noted  . Insect bite 06/26/2018  . Urticaria 01/24/2018  . Seasonal and perennial allergic rhinitis 01/24/2018  . Allergic conjunctivitis 01/24/2018  . Coughing 01/24/2018  . Fatigue 04/05/2016  . Hypersomnia with sleep apnea 04/05/2016  . Snoring 04/05/2016  . Hearing loss sensory, bilateral 04/05/2016  . Constipation 07/14/2015  . Abdominal pain 07/14/2015  . Generalized idiopathic epilepsy, not intractable, without status epilepticus ([place]) 02/03/2015   Past Medical History:  Diagnosis  Date  . 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 Seizure [facility])     Family History  Problem Relation Age of Onset  . Healthy Mother   . Healthy Father   . Colon cancer Maternal Grandfather   . Breast cancer Paternal Grandmother   . Other Paternal Grandmother        Myles Lipps disease  . Seizures Neg Hx     Past Surgical History:  Procedure Laterality Date  . ABDOMINAL HYSTERECTOMY  2011  . CESAREAN SECTION     @ age 53  . CHOLECYSTECTOMY    . COCHLEAR IMPLANT     @ age 57  . GASTRIC BYPASS  07/05/2016  . PANNICULECTOMY  10/12/2017   Dr. Kellie Simmering  . TONSILECTOMY, ADENOIDECTOMY, BILATERAL MYRINGOTOMY AND TUBES     Social History   Occupational History    Comment: n/a-unemployed  Tobacco Use  . Smoking status: Former Smoker    Types: Cigarettes    Quit date: 09/27/2012    Years since quitting: 7.8  . Smokeless tobacco: Never Used  Vaping Use  . Vaping Use: Never used  Substance and Sexual Activity  . Alcohol use: No    Alcohol/week: 0.0 standard drinks    Comment: quit 11/2010  . Drug use: No    Comment: marijuana; quit 3/ 2012  . Sexual activity: Not on file

## 2020-09-03 ENCOUNTER — Ambulatory Visit: Payer: Medicaid Other | Admitting: Orthopaedic Surgery

## 2020-09-05 ENCOUNTER — Other Ambulatory Visit: Payer: Self-pay

## 2020-09-05 ENCOUNTER — Other Ambulatory Visit: Payer: Self-pay | Admitting: Orthopaedic Surgery

## 2020-09-05 ENCOUNTER — Ambulatory Visit (INDEPENDENT_AMBULATORY_CARE_PROVIDER_SITE_OTHER): Payer: Medicaid Other | Admitting: Orthopaedic Surgery

## 2020-09-05 ENCOUNTER — Ambulatory Visit (INDEPENDENT_AMBULATORY_CARE_PROVIDER_SITE_OTHER): Payer: Medicaid Other

## 2020-09-05 ENCOUNTER — Telehealth: Payer: Self-pay | Admitting: Orthopaedic Surgery

## 2020-09-05 ENCOUNTER — Encounter: Payer: Self-pay | Admitting: Orthopaedic Surgery

## 2020-09-05 VITALS — Ht 66.0 in | Wt 201.0 lb

## 2020-09-05 DIAGNOSIS — M25561 Pain in right knee: Secondary | ICD-10-CM

## 2020-09-05 DIAGNOSIS — M25562 Pain in left knee: Secondary | ICD-10-CM

## 2020-09-05 DIAGNOSIS — G8929 Other chronic pain: Secondary | ICD-10-CM

## 2020-09-05 NOTE — Addendum Note (Signed)
Addended by: Precious Bard on: 09/05/2020 09:41 AM   Modules accepted: Orders

## 2020-09-05 NOTE — Progress Notes (Signed)
Office Visit Note   Patient: Mercedes Parks           Date of Birth: [birth year]           MRN: [id number] Visit Date: 09/05/2020              Requested by: Denny Levy, [place],  [place] [address] PCP: Denny Levy, [place]   Assessment & Plan: Visit Diagnoses:  1. Chronic pain of right knee   2. Chronic pain of left knee     Plan: Impression is chronic bilateral knee pain without relief from cortisone injection and other conservative treatments.  At this point we will need to obtain CT arthrograms of both knees.  We will also obtain arthritis panel to rule out inflammatory arthritis.  Follow-up after the CT arthrograms.  Today's encounter was performed through a sign language interpreter which added to the complexity.  Follow-Up Instructions: Return if symptoms worsen or fail to improve.   Orders:  Orders Placed This Encounter  Procedures  . XR KNEE 3 VIEW RIGHT   No orders of the defined types were placed in this encounter.     Procedures: No procedures performed   Clinical Data: No additional findings.   Subjective: Chief Complaint  Patient presents with  . Right Knee - Pain  . Left Knee - Pain    Mercedes Parks returns today for bilateral knee pain.  Right knee hurts worse than the left.  She has constant pressure that is worse with weightbearing.  Has trouble sleeping.  We have seen her in the past for these issues.  I drew off 20 cc of fluid from the left knee a month ago.  She did go to the urgent care and received an IM injection of Toradol as well as oral prednisone.  She has pain throughout the knees is worse with activity.   Review of Systems  Constitutional: Negative.   HENT: Negative.   Eyes: Negative.   Respiratory: Negative.   Cardiovascular: Negative.   Endocrine: Negative.   Musculoskeletal: Negative.   Neurological: Negative.   Hematological: Negative.   Psychiatric/Behavioral: Negative.   All other systems reviewed and are  negative.    Objective: Vital Signs: Ht 5\' 6"  (1.676 m)   Wt 201 lb (91.2 kg)   BMI 32.44 kg/m   Physical Exam Vitals and nursing note reviewed.  Constitutional:      Appearance: She is well-developed and well-nourished.  Pulmonary:     Effort: Pulmonary effort is normal.  Skin:    General: Skin is warm.     Capillary Refill: Capillary refill takes less than 2 seconds.  Neurological:     Mental Status: She is alert and oriented to person, place, and time.  Psychiatric:        Mood and Affect: Mood and affect normal.        Behavior: Behavior normal.        Thought Content: Thought content normal.        Judgment: Judgment normal.     Ortho Exam Bilateral knees exams are stable.  Questionable effusion.  Pain with range of motion and mild crepitus. Specialty Comments:  No specialty comments available.  Imaging: XR KNEE 3 VIEW RIGHT  Result Date: 09/05/2020 No acute or structural abnormalities.  Mild patellofemoral spurring    PMFS History: Patient Active Problem List   Diagnosis Date Noted  . Insect bite 06/26/2018  . Urticaria 01/24/2018  . Seasonal and perennial  allergic rhinitis 01/24/2018  . Allergic conjunctivitis 01/24/2018  . Coughing 01/24/2018  . Fatigue 04/05/2016  . Hypersomnia with sleep apnea 04/05/2016  . Snoring 04/05/2016  . Hearing loss sensory, bilateral 04/05/2016  . Constipation 07/14/2015  . Abdominal pain 07/14/2015  . Generalized idiopathic epilepsy, not intractable, without status epilepticus ([place]) 02/03/2015   Past Medical History:  Diagnosis Date  . 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 Seizure [facility])     Family History  Problem Relation Age of Onset  . Healthy Mother   . Healthy Father   . Colon cancer Maternal Grandfather   . Breast cancer Paternal Grandmother   . Other Paternal Grandmother        Myles Lipps disease  . Seizures Neg Hx     Past Surgical History:  Procedure Laterality Date  . ABDOMINAL  HYSTERECTOMY  2011  . CESAREAN SECTION     @ age 42  . CHOLECYSTECTOMY    . COCHLEAR IMPLANT     @ age 4  . GASTRIC BYPASS  07/05/2016  . PANNICULECTOMY  10/12/2017   Dr. Kellie Simmering  . TONSILECTOMY, ADENOIDECTOMY, BILATERAL MYRINGOTOMY AND TUBES     Social History   Occupational History    Comment: n/a-unemployed  Tobacco Use  . Smoking status: Former Smoker    Types: Cigarettes    Quit date: 09/27/2012    Years since quitting: 7.9  . Smokeless tobacco: Never Used  Vaping Use  . Vaping Use: Never used  Substance and Sexual Activity  . Alcohol use: No    Alcohol/week: 0.0 standard drinks    Comment: quit 11/2010  . Drug use: No    Comment: marijuana; quit 3/ 2012  . Sexual activity: Not on file

## 2020-09-05 NOTE — Telephone Encounter (Signed)
Mercedes Parks with [facility] called asking that Dr.Xu change the code for the arthrogram to [id number] or if he would call and give her the ok she can change it.  [name] CB# [phone] Ext* [phone]

## 2020-09-08 ENCOUNTER — Other Ambulatory Visit: Payer: Self-pay | Admitting: Orthopaedic Surgery

## 2020-09-08 ENCOUNTER — Encounter: Payer: Self-pay | Admitting: Orthopaedic Surgery

## 2020-09-08 DIAGNOSIS — M25562 Pain in left knee: Secondary | ICD-10-CM

## 2020-09-08 DIAGNOSIS — M25561 Pain in right knee: Secondary | ICD-10-CM

## 2020-09-08 DIAGNOSIS — G8929 Other chronic pain: Secondary | ICD-10-CM

## 2020-09-08 LAB — ANA: Anti Nuclear Antibody (ANA): NEGATIVE

## 2020-09-08 LAB — RHEUMATOID FACTOR: Rheumatoid fact SerPl-aCnc: <14 IU/mL (ref <14)

## 2020-09-08 LAB — SEDIMENTATION RATE: Sed Rate: 38 mm/h — ABNORMAL HIGH (ref 0–20)

## 2020-09-08 LAB — URIC ACID: Uric Acid, Serum: 2.7 mg/dL (ref 2.5–7.0)

## 2020-09-08 NOTE — Telephone Encounter (Signed)
Can you help reschedule for her.  Thanks.

## 2020-09-11 ENCOUNTER — Other Ambulatory Visit: Payer: Self-pay

## 2020-09-11 ENCOUNTER — Ambulatory Visit: Payer: Medicaid Other | Admitting: Family Medicine

## 2020-09-11 ENCOUNTER — Encounter: Payer: Self-pay | Admitting: Family Medicine

## 2020-09-11 VITALS — BP 104/72 | HR 86 | Ht 67.0 in | Wt 200.0 lb

## 2020-09-11 DIAGNOSIS — G40309 Generalized idiopathic epilepsy and epileptic syndromes, not intractable, without status epilepticus: Secondary | ICD-10-CM | POA: Diagnosis not present

## 2020-09-11 MED ORDER — LEVETIRACETAM 1000 MG PO TABS: 1000.0000 mg | ORAL_TABLET | Freq: Two times a day (BID) | ORAL | 3 refills | Status: DC

## 2020-09-11 NOTE — Progress Notes (Signed)
Chief Complaint  Patient presents with  . Follow-up    Room 2, reports sz on 11/29 while on vacation, friend witnessed it, she feels it was stress related      HISTORY OF PRESENT ILLNESS: Today 09/11/20  Mercedes Parks is a 39 y.o. female here today for follow up for seizures. She continues levetiracetam 1000mg  BID. She reports that she had done very well up until recently.  She admits to significantly more stress at home.  Her son is diagnosed with ADHD.  She had taken a trip to [place] with her family where she had a generalized seizure in the setting of about 9 missed doses of levetiracetam.  Patient shares a video of seizure activity in the office which demonstrates that she was unresponsive with her head turned to the right.  Generalized tonic-clonic movement noted of upper extremities, unable to visualize lower extremities.  Patient reports that symptoms persisted for a total of 30 minutes.  She was given Ativan in route to the ER.  She was seen by the ER in [place].  Work-up was unremarkable with the exception of low AED level.  CT scan normal.  She has resumed medication twice daily.  She feels that she is able to make accommodations in her schedule to ensure that she takes medication as prescribed.  She is hesitant to switch to an extended release formulation.   HISTORY (copied from previous note)  Mercedes Parks is a 39 y.o. female here today for follow up for seizure. She continues levetiracetam 1000mg  twice daily. She is tolerating medication well with no obvious adverse effects. No seizure activity noted since last visit.    History (copied from Dr Nikki Dom note on 05/15/2017)  UPDATE (08/12/17, VRP): Since last visit, doing well. Tolerating meds. No alleviating or aggravating factors. No more seizures. Does note some vivid dreams (wakes up and feels like something has touched her)  UPDATE 05/10/17: Since last visit, was doing well, and had gastric bypass surgery  (Oct 2017). Then on 04/25/17, was drinking etoh (~20 oz, red's applehad not done so in a while), missed evening keppra doses in July 2018. Then had multiple breakthrough seizures outside (2-3 minutes each; 4-5 sz), then came inside with help of friends, and continued to have more seizures (another 45-60 minutes)>EMS arrived on scene. They were able to "stop" a few seizures with sternal rub. EMT raised possibility of pseudoseizures. Patient went to ER and was observed few hours and then d/c home. Records reviewed. Urine preg test neg. Labs neg. Patient was confused, tired, sore. She thinks she hit her head.   UPDATE 02/11/16: Since last visit, doing well. No seizures. Had a strange dream 2-3 weeks ago, that felt like possible seizure, then she woke up gasping for breath. Denies snoring or witnessed apnea, but lives alone. Has some daytime sleepiness even after full nights sleep.  UPDATE 02/03/15 (VRP): Since last visit, doing well. No seizures. Tolerating medications. Some more anxiety, now on cymbalta, and feels better.  UPDATE 08/05/14 (LL): Since last visit, patient has been well, no seizures. She is assisted by sign-language interpreter today. ToleratingLevitiracetam1000 mg BID well. Has been having some increased blurred vision and occasional diplopia, recent eye exam was normal with the exception of a change on her eyeglass Rx. She is worried about memory loss, having trouble with remembering appointments and things that she and her son have scheduled. Has lost 40 lb. In the last year.   UPDATE 01/30/14 (LL):Colinda comes  in for routine follow up, no seizures since last visit.She had total hysterectomy in December due to endometriosis.Tolerating Levitiracetam1000 mg BID with only occasional dizziness.She reports trouble staying asleep, wakes usually 2-3 times. Doing very well.Asks about "vaping" and if it may trigger seizure.  UPDATE 07/31/13: On 07/27/13 patient was at a [place] party,  had several alcoholic drinks, was exposed to secondhand marijuana smoke, and had multiple back-to-back seizures. Apparently she had several minutes of convulsions followed by several minutes of remission. Seizures started and stopped multiple times. Ambulance was called and arrived to pick her up within 45 minutes of onset of symptoms. The symptoms continued while patient was at the hospital. She was given benzodiazepine in the ambulance. She was given 1000 mg Keppra IV at the hospital. She was continued on the same dose of antiseizure medication (levetiracetam 500 mg twice a day). Patient tells that she has been missing some doses of antiseizure medication. She misses 4-8 tablets per month. This usually happens when she falls asleep after school and misses her dose.   UPDATE 11/20/12: She is doing very well, there was no recurrent seizure, she is tolerating Keppra 500 mg twice a day.   UPDATE 02/02/12: No sz. More balance troubles, weight gain, decr energy, and fatigue. Drinks lots of "energy drinks". Having trouble with stairs (trips and falls).   UPDATE 07/06/11: Doing well. No sz. Back to driving. Now with endometriosis and planning to start lupron shots, but concerned about warning on package about epilepsy.   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 He is accompanied by her mother, father and sign language interpreter. 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 Age 32 yrs - cochlear implants. Oct 2011 - dx'd with endometriosis; treated with tramadol; then had an episode of lightheadedness, out of body sensation, then passed out; reportedly had convulsions and tongue biting, noincontinence; then slept for 2 hours. Feb 2012 - exposure to synthetic marijuana, then had similar episode of seizure. March 2012 - several episodes of night time awakening, "weird breathing", not knowing if  she is awake or dreaming; EEG done at Dr. 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 April 2012 - started on LEV 500mg  BID    REVIEW OF SYSTEMS: Out of a complete 14 system review of symptoms, the patient complains only of the following symptoms, seizures, depression, anxiety, hearing impaired and all other reviewed systems are negative.   ALLERGIES: Allergies  Allergen Reactions  . Amoxicillin Hives  . Hydrocodone Nausea And Vomiting  . Nsaids     Gastric Bypass Surgery  . Penicillins Hives    Has patient had a PCN reaction causing immediate rash, facial/tongue/throat swelling, SOB or lightheadedness with hypotension: Yes Has patient had a PCN reaction causing severe rash involving mucus membranes or skin necrosis: No Has patient had a PCN reaction that required hospitalization No Has patient had a PCN reaction occurring within the last 10 years: Non If all of the above answers are "NO", then may proceed with Cephalosporin use.   Ignacia Bayley Antibiotics Hives     HOME MEDICATIONS: Outpatient Medications Prior to Visit  Medication Sig Dispense Refill  . escitalopram (LEXAPRO) 10 MG tablet Take 10 mg by mouth daily.    Marland Kitchen estradiol (ESTRACE) 2 MG tablet Take 2 mg by mouth daily.    . Multiple Vitamins-Minerals (OPURITY PO) Take by mouth.    . Nutritional  Supplements (EQUATE PO) Take by mouth.    . pantoprazole (PROTONIX) 40 MG tablet     . phentermine (ADIPEX-P) 37.5 MG tablet Take 37.5 mg by mouth at bedtime.    . levETIRAcetam (KEPPRA) 1000 MG tablet TAKE 1 TABLET BY MOUTH TWICE A DAY 180 tablet 1   No facility-administered medications prior to visit.     PAST MEDICAL HISTORY: Past Medical History:  Diagnosis Date  . 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 Seizure ([place])      PAST SURGICAL HISTORY: Past Surgical History:  Procedure Laterality Date  . ABDOMINAL HYSTERECTOMY  2011  . CESAREAN SECTION     @ age 89  . CHOLECYSTECTOMY     . COCHLEAR IMPLANT     @ age 45  . GASTRIC BYPASS  07/05/2016  . PANNICULECTOMY  10/12/2017   Dr. Kellie Simmering  . TONSILECTOMY, ADENOIDECTOMY, BILATERAL MYRINGOTOMY AND TUBES       FAMILY HISTORY: Family History  Problem Relation Age of Onset  . Healthy Mother   . Healthy Father   . Colon cancer Maternal Grandfather   . Breast cancer Paternal Grandmother   . Other Paternal Grandmother        Myles Lipps disease  . Seizures Neg Hx      SOCIAL HISTORY: Social History   Socioeconomic History  . Marital status: Single    Spouse name: Not on file  . Number of children: 1  . Years of education: College  . Highest education level: Not on file  Occupational History    Comment: n/a-unemployed  Tobacco Use  . Smoking status: Former Smoker    Types: Cigarettes    Quit date: 09/27/2012    Years since quitting: 7.9  . Smokeless tobacco: Never Used  Vaping Use  . Vaping Use: Never used  Substance and Sexual Activity  . Alcohol use: No    Alcohol/week: 0.0 standard drinks    Comment: quit 11/2010  . Drug use: No    Comment: marijuana; quit 3/ 2012  . Sexual activity: Not on file  Other Topics Concern  . Not on file  Social History Narrative   Patient is single with one child.   Patient is right handed.   Patient has hs education and is currently in college.   Patient does not drink any caffeine.   Social Determinants of Health   Financial Resource Strain: Not on file  Food Insecurity: Not on file  Transportation Needs: Not on file  Physical Activity: Not on file  Stress: Not on file  Social Connections: Not on file  Intimate Partner Violence: Not on file      PHYSICAL EXAM  Vitals:   09/11/20 1406  BP: 104/72  Pulse: 86  Weight: 200 lb (90.7 kg)  Height: 5\' 7"  (1.702 m)   Body mass index is 31.32 kg/m.   Generalized: Well developed, in no acute distress  Cardiology: normal rate and rhythm, no murmur auscultated  Respiratory: clear to auscultation  bilaterally    Neurological examination  Mentation: Alert oriented to time, place, history taking. Follows all commands speech and language fluent Cranial nerve II-XII: Pupils were equal round reactive to light. Extraocular movements were full, visual field were full on confrontational test. Facial sensation and strength were normal. Head turning and shoulder shrug  were normal and symmetric. Motor: The motor testing reveals 5 over 5 strength of all 4 extremities. Good symmetric motor tone is noted throughout.  Sensory:  Sensory testing is intact to soft touch on all 4 extremities. No evidence of extinction is noted.  Coordination: Cerebellar testing reveals good finger-nose-finger and heel-to-shin bilaterally.  Gait and station: Gait is normal.  Reflexes: Deep tendon reflexes are symmetric and normal bilaterally.     DIAGNOSTIC DATA (LABS, IMAGING, TESTING) - I reviewed patient records, labs, notes, testing and imaging myself where available.  Lab Results  Component Value Date   WBC 7.4 05/15/2018   HGB 15.5 05/15/2018   HCT 47.3 (H) 05/15/2018   MCV 87 05/15/2018   PLT 292 05/15/2018      Component Value Date/Time   NA 142 05/15/2018 1438   K 4.6 05/15/2018 1438   CL 99 05/15/2018 1438   CO2 27 05/15/2018 1438   GLUCOSE 87 05/15/2018 1438   BUN 14 05/15/2018 1438   CREATININE 0.80 05/15/2018 1438   CALCIUM 9.3 05/15/2018 1438   PROT 6.8 05/15/2018 1438   ALBUMIN 3.9 05/15/2018 1438   AST 19 05/15/2018 1438   ALT 15 05/15/2018 1438   ALKPHOS 89 05/15/2018 1438   BILITOT 0.3 05/15/2018 1438   GFRNONAA 95 05/15/2018 1438   GFRAA 110 05/15/2018 1438   No results found for: CHOL, HDL, LDLCALC, LDLDIRECT, TRIG, CHOLHDL Lab Results  Component Value Date   HGBA1C 5.0 05/15/2018   Lab Results  Component Value Date   VITAMINB12 680 05/15/2018   Lab Results  Component Value Date   TSH 3.430 05/15/2018    No flowsheet data found.   ASSESSMENT AND PLAN  39 y.o. year  old female  has a past medical history of Hearing loss and Seizure ([place]). here with   Generalized idiopathic epilepsy, not intractable, without status epilepticus ([place])  Shayne reports that she was doing very well up until 09/03/2020 where she suffered a generalized seizure in the setting of increased stress and missed doses of levetiracetam.  She estimates that she missed about 9 doses of her medication the week prior to seizure.  Fortunately, there were no injuries and work-up was unremarkable.  We have discussed switching her to extended release, however, she is hesitant to do so.  She feels that she is able to set reminders on her phone or use a calendar in order to remember medication dosing.  She reports that her partner is able to help her at home.  She was encouraged to reach out to her primary care provider for any worsening anxiety as she has been under more stress caring for her son.  Healthy lifestyle habits reviewed.  She was reminded of [place] law not to drive for 6 months.  She will follow-up with me in 6 months, sooner if needed.  She verbalizes understanding and agreement with this plan.  No orders of the defined types were placed in this encounter.     I spent 20 minutes of face-to-face and non-face-to-face time with patient.  This included previsit chart review, lab review, study review, order entry, electronic health record documentation, patient education.    Debbora Presto, MSN, FNP-C 09/11/2020, 3:00 PM  [facility] [address] [place], [place] [address] [phone]

## 2020-09-11 NOTE — Patient Instructions (Addendum)
Below is our plan:  We will continue levetiracetam 1000mg  BID. Consider setting an alarm to remind you to take the evening dose. Try to manage stress as best as you can. Follow up with primary care asap.   Please make sure you are staying well hydrated. I recommend 50-60 ounces daily. Well balanced diet and regular exercise encouraged.   Please continue follow up with care team as directed.   Follow up in 6 months   You may receive a survey regarding today's visit. I encourage you to leave honest feed back as I do use this information to improve patient care. Thank you for seeing me today!    According to [place] law, you can not drive unless you are seizure / syncope free for at least 6 months and under physician's care.  Please maintain precautions. Do not participate in activities where a loss of awareness could harm you or someone else. No swimming alone, no tub bathing, no hot tubs, no driving, no operating motorized vehicles (cars, ATVs, motocycles, etc), lawnmowers, power tools or firearms. No standing at heights, such as rooftops, ladders or stairs. Avoid hot objects such as stoves, heaters, open fires. Wear a helmet when riding a bicycle, scooter, skateboard, etc. and avoid areas of traffic. Set your water heater to 120 degrees or less.     Seizure, Adult A seizure is a sudden burst of abnormal electrical activity in the brain. Seizures usually last from 30 seconds to 2 minutes. They can cause many different symptoms. Usually, seizures are not harmful unless they last a long time. What are the causes? Common causes of this condition include:  Fever or infection.  Conditions that affect the brain, such as: ? A brain abnormality that you were born with. ? A brain or head injury. ? Bleeding in the brain. ? A tumor. ? Stroke. ? Brain disorders such as autism or cerebral palsy.  Low blood sugar.  Conditions that are passed from parent to child (are inherited).  Problems with  substances, such as: ? Having a reaction to a drug or a medicine. ? Suddenly stopping the use of a substance (withdrawal). In some cases, the cause may not be known. A person who has repeated seizures over time without a clear cause has a condition called epilepsy. What increases the risk? You are more likely to get this condition if you have:  A family history of epilepsy.  Had a seizure in the past.  A brain disorder.  A history of head injury, lack of oxygen at birth, or strokes. What are the signs or symptoms? There are many types of seizures. The symptoms vary depending on the type of seizure you have. Examples of symptoms during a seizure include:  Shaking (convulsions).  Stiffness in the body.  Passing out (losing consciousness).  Head nodding.  Staring.  Not responding to sound or touch.  Loss of bladder control and bowel control. Some people have symptoms right before and right after a seizure happens. Symptoms before a seizure may include:  Fear.  Worry (anxiety).  Feeling like you may vomit (nauseous).  Feeling like the room is spinning (vertigo).  Feeling like you saw or heard something before (dj vu).  Odd tastes or smells.  Changes in how you see. You may see flashing lights or spots. Symptoms after a seizure happens can include:  Confusion.  Sleepiness.  Headache.  Weakness on one side of the body. How is this treated? Most seizures will stop on their  own in under 5 minutes. In these cases, no treatment is needed. Seizures that last longer than 5 minutes will usually need treatment. Treatment can include:  Medicines given through an IV tube.  Avoiding things that are known to cause your seizures. These can include medicines that you take for another condition.  Medicines to treat epilepsy.  Surgery to stop the seizures. This may be needed if medicines do not help. Follow these instructions at home: Medicines  Take over-the-counter  and prescription medicines only as told by your doctor.  Do not eat or drink anything that may keep your medicine from working, such as alcohol. Activity  Do not do any activities that would be dangerous if you had another seizure, like driving or swimming. Wait until your doctor says it is safe for you to do them.  If you live in the [place] ask your local DMV (department of motor vehicles) when you can drive.  Get plenty of rest. Teaching others Teach friends and family what to do when you have a seizure. They should:  Lay you on the ground.  Protect your head and body.  Loosen any tight clothing around your neck.  Turn you on your side.  Not hold you down.  Not put anything into your mouth.  Know whether or not you need emergency care.  Stay with you until you are better.  General instructions  Contact your doctor each time you have a seizure.  Avoid anything that gives you seizures.  Keep a seizure diary. Write down: ? What you think caused each seizure. ? What you remember about each seizure.  Keep all follow-up visits as told by your doctor. This is important. Contact a doctor if:  You have another seizure.  You have seizures more often.  There is any change in what happens during your seizures.  You keep having seizures with treatment.  You have symptoms of being sick or having an infection. Get help right away if:  You have a seizure that: ? Lasts longer than 5 minutes. ? Is different than seizures you had before. ? Makes it harder to breathe. ? Happens after you hurt your head.  You have any of these symptoms after a seizure: ? Not being able to speak. ? Not being able to use a part of your body. ? Confusion. ? A bad headache.  You have two or more seizures in a row.  You do not wake up right after a seizure.  You get hurt during a seizure. These symptoms may be an emergency. Do not wait to see if the symptoms will go away. Get medical help  right away. Call your local emergency services (911 in the [place] Do not drive yourself to the hospital. Summary  Seizures usually last from 30 seconds to 2 minutes. Usually, they are not harmful unless they last a long time.  Do not eat or drink anything that may keep your medicine from working, such as alcohol.  Teach friends and family what to do when you have a seizure.  Contact your doctor each time you have a seizure. This information is not intended to replace advice given to you by your health care provider. Make sure you discuss any questions you have with your health care provider. Document Revised: 12/01/2018 Document Reviewed: 12/01/2018 Elsevier Patient Education  [place].

## 2020-09-12 ENCOUNTER — Encounter: Payer: Self-pay | Admitting: Family Medicine

## 2020-09-16 ENCOUNTER — Telehealth: Payer: Self-pay | Admitting: Orthopaedic Surgery

## 2020-09-16 NOTE — Telephone Encounter (Signed)
Called patient left message to return call to schedule an appointment for MRI review with Dr Erlinda Hong      MRI scheduled   09/30/2020

## 2020-09-17 ENCOUNTER — Telehealth: Payer: Self-pay | Admitting: *Deleted

## 2020-09-17 NOTE — Telephone Encounter (Signed)
Please call patient, her recurrent seizure is most likely due to missing Keppra dosing, Keppra strictly excreted from body by kidney, does not interact with other medications.  If she has been on phentermine low-dose in the past without causing her trouble, she may continue on it.

## 2020-09-17 NOTE — Telephone Encounter (Signed)
Colletta Maryland NP see's pt for weight loss.  Taking phentermine 37.5mg  now for the  Last 6-8 wks.  Had seizure when [place] in [place], but had missed 9 doses of her keppra.  ? If seizure due to missed doses or increase of phentermine of 37.5mg .  Not mentioned in last note with AL/NP in 09-11-20.  Would this medication be contraindication with keppra.  Should pt take or take less?  Dr. Leta Baptist out as well as Amy NP. Please advise.

## 2020-09-17 NOTE — Telephone Encounter (Signed)
I called LMVM for Mercedes Parks to return call when she can.

## 2020-09-22 NOTE — Telephone Encounter (Signed)
I called and LMVM for Mercedes Parks, thru [place] receptionist that message in [place] from [facility] on call last week per pt.  If questions she can call back.

## 2020-09-25 ENCOUNTER — Other Ambulatory Visit: Payer: Self-pay | Admitting: Orthopaedic Surgery

## 2020-09-25 DIAGNOSIS — G8929 Other chronic pain: Secondary | ICD-10-CM

## 2020-09-25 DIAGNOSIS — M25562 Pain in left knee: Secondary | ICD-10-CM

## 2020-09-30 ENCOUNTER — Ambulatory Visit (INDEPENDENT_AMBULATORY_CARE_PROVIDER_SITE_OTHER): Payer: Medicaid Other | Admitting: Plastic Surgery

## 2020-09-30 ENCOUNTER — Encounter: Payer: Self-pay | Admitting: Plastic Surgery

## 2020-09-30 ENCOUNTER — Ambulatory Visit
Admission: RE | Admit: 2020-09-30 | Discharge: 2020-09-30 | Disposition: A | Discharge status: Home / Self Care | Payer: Medicaid Other | Source: Ambulatory Visit | Attending: Orthopaedic Surgery | Admitting: Orthopaedic Surgery

## 2020-09-30 ENCOUNTER — Other Ambulatory Visit: Payer: Self-pay

## 2020-09-30 DIAGNOSIS — G8929 Other chronic pain: Secondary | ICD-10-CM

## 2020-09-30 DIAGNOSIS — M25561 Pain in right knee: Secondary | ICD-10-CM

## 2020-09-30 DIAGNOSIS — M793 Panniculitis, unspecified: Secondary | ICD-10-CM | POA: Diagnosis not present

## 2020-09-30 MED ORDER — IOPAMIDOL (ISOVUE-M 200) INJECTION 41%
60.0000 mL | Freq: Once | INTRAMUSCULAR | Status: AC
  Administered 2020-09-30: 60 mL via INTRA_ARTICULAR

## 2020-09-30 NOTE — Progress Notes (Signed)
Patient ID: Mercedes Parks, female    DOB: 1981-07-02, 40 y.o.   MRN: [id number]   Chief Complaint  Patient presents with  . Skin Problem    The patient is a 40 year old female here with an interpreter for sign language.  She is here for an evaluation of her thighs.  She is 5 feet 7 inches tall and weighs 202 pounds.  She had a gastric bypass in 2017 and was 300 pounds at the time.  She has been able to keep the weight off and has been stable for at least 6 months.  She does not have diabetes and is not a smoker.  She had an abdominoplasty at [facility] in [place] in 2018.  She complains of constant yeast infections between her legs and in her groin areas.  She sometimes has to shower 2-3 times a day especially in the summer.  She tries creams and lotions to try to mitigate the rashes.  She is very self-conscious about the odor.   Review of Systems  Constitutional: Positive for activity change. Negative for appetite change.  HENT: Negative.   Eyes: Negative.   Respiratory: Negative.   Cardiovascular: Negative.   Gastrointestinal: Negative for abdominal distention and abdominal pain.  Genitourinary: Negative.   Musculoskeletal: Negative.   Skin: Positive for rash.  Psychiatric/Behavioral: Negative.     Past Medical History:  Diagnosis Date  . 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 Seizure [facility])     Past Surgical History:  Procedure Laterality Date  . ABDOMINAL HYSTERECTOMY  2011  . CESAREAN SECTION     @ age 32  . CHOLECYSTECTOMY    . COCHLEAR IMPLANT     @ age 35  . GASTRIC BYPASS  07/05/2016  . PANNICULECTOMY  10/12/2017   Dr. Hart [place]  . TONSILECTOMY, ADENOIDECTOMY, BILATERAL MYRINGOTOMY AND TUBES        Current Outpatient Medications:  .  escitalopram (LEXAPRO) 10 MG tablet, Take 10 mg by mouth daily., Disp: , Rfl:  .  estradiol (ESTRACE) 2 MG tablet, Take 2 mg by mouth daily., Disp: , Rfl:  .  levETIRAcetam (KEPPRA) 1000 MG tablet, Take 1 tablet  (1,000 mg total) by mouth 2 (two) times daily., Disp: 180 tablet, Rfl: 3 .  Multiple Vitamins-Minerals (OPURITY PO), Take by mouth., Disp: , Rfl:  .  Nutritional Supplements (EQUATE PO), Take by mouth., Disp: , Rfl:  .  pantoprazole (PROTONIX) 40 MG tablet, , Disp: , Rfl:  .  phentermine (ADIPEX-P) 37.5 MG tablet, Take 37.5 mg by mouth at bedtime., Disp: , Rfl:    Objective:   There were no vitals filed for this visit.  Physical Exam Vitals and nursing note reviewed.  Constitutional:      Appearance: Normal appearance.  HENT:     Head: Normocephalic and atraumatic.  Cardiovascular:     Rate and Rhythm: Normal rate.     Pulses: Normal pulses.  Pulmonary:     Effort: Pulmonary effort is normal.  Abdominal:     General: Abdomen is flat. There is no distension.     Tenderness: There is no abdominal tenderness.     Hernia: No hernia is present.  Musculoskeletal:        General: No swelling or deformity. Normal range of motion.  Skin:    General: Skin is warm.     Capillary Refill: Capillary refill takes less than 2 seconds.     Coloration: Skin is  not jaundiced.     Findings: No bruising or rash.  Neurological:     General: No focal deficit present.     Mental Status: She is alert and oriented to person, place, and time.  Psychiatric:        Mood and Affect: Mood normal.        Behavior: Behavior normal.     Assessment & Plan:  Panniculitis  Patient is a good candidate for bilateral thigh panniculectomy with liposuction.  She would like [place] to submit to insurance and would like a quote as well.  Pictures were obtained of the patient and placed in the chart with the patient's or guardian's permission.  [place], DO

## 2020-10-07 ENCOUNTER — Encounter: Payer: Self-pay | Admitting: Orthopaedic Surgery

## 2020-10-07 ENCOUNTER — Ambulatory Visit (INDEPENDENT_AMBULATORY_CARE_PROVIDER_SITE_OTHER): Payer: Medicaid Other | Admitting: Orthopaedic Surgery

## 2020-10-07 DIAGNOSIS — M25562 Pain in left knee: Secondary | ICD-10-CM

## 2020-10-07 DIAGNOSIS — G8929 Other chronic pain: Secondary | ICD-10-CM | POA: Diagnosis not present

## 2020-10-07 DIAGNOSIS — M25561 Pain in right knee: Secondary | ICD-10-CM | POA: Diagnosis not present

## 2020-10-07 NOTE — Progress Notes (Signed)
Office Visit Note   Patient: Mercedes Parks           Date of Birth: 1981-04-13           MRN: [id number] Visit Date: 10/07/2020              Requested by: Mercedes Parks, Mercedes Parks,  Mercedes Parks Mercedes Parks PCP: Mercedes Levy, PA   Assessment & Plan: Visit Diagnoses:  1. Chronic pain of right knee   2. Chronic pain of left knee     Plan: CT scans are negative for meniscal pathology.  She has chondromalacia of the lateral compartment bilaterally.  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  All of these findings were reviewed with the patient through the help of sign language interpreter.  We had a lengthy discussion on treatment options and based on these she would like to do physical therapy and consider kinesiotaping as well as continuing weight loss.  Questions encouraged and answered.  Follow-Up Instructions: Return if symptoms worsen or fail to improve.   Orders:  Orders Placed This Encounter  Procedures  . Ambulatory referral to Physical Therapy   No orders of the defined types were placed in this encounter.     Procedures: No procedures performed   Clinical Data: No additional findings.   Subjective: Chief Complaint  Patient presents with  . Left Knee - Pain  . Right Knee - Pain    [name] is here for bilateral knee CT arthrogram review.  Denies any changes in her symptoms.  She is here with the sign language interpreter.   Review of Systems   Objective: Vital Signs: There were no vitals taken for this visit.  Physical Exam  Ortho Exam Bilateral knee exams are unchanged. Specialty Comments:  No specialty comments available.  Imaging: No results found.   PMFS History: Patient Active Problem List   Diagnosis Date Noted  . Panniculitis 09/30/2020  . Insect bite 06/26/2018  . Urticaria 01/24/2018  . Seasonal and perennial allergic rhinitis 01/24/2018  . Allergic conjunctivitis 01/24/2018  . Coughing  01/24/2018  . Fatigue 04/05/2016  . Hypersomnia with sleep apnea 04/05/2016  . Snoring 04/05/2016  . Hearing loss sensory, bilateral 04/05/2016  . Constipation 07/14/2015  . Abdominal pain 07/14/2015  . Generalized idiopathic epilepsy, not intractable, without status epilepticus (Mercedes Parks) 02/03/2015   Past Medical History:  Diagnosis Date  . 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 Seizure Mercedes Parks)     Family History  Problem Relation Age of Onset  . Healthy Mother   . Healthy Father   . Colon cancer Maternal Grandfather   . Breast cancer Paternal Grandmother   . Other Paternal Grandmother        Mercedes Parks disease  . Seizures Neg Hx     Past Surgical History:  Procedure Laterality Date  . ABDOMINAL HYSTERECTOMY  2011  . CESAREAN SECTION     @ age 56  . CHOLECYSTECTOMY    . COCHLEAR IMPLANT     @ age 20  . GASTRIC BYPASS  07/05/2016  . PANNICULECTOMY  10/12/2017   Mercedes Parks  . TONSILECTOMY, ADENOIDECTOMY, BILATERAL MYRINGOTOMY AND TUBES     Social History   Occupational History    Comment: n/a-unemployed  Tobacco Use  . Smoking status: Former Smoker    Types: Cigarettes    Quit date: 09/27/2012    Years since quitting: 8.0  . Smokeless  tobacco: Never Used  Vaping Use  . Vaping Use: Never used  Substance and Sexual Activity  . Alcohol use: No    Alcohol/week: 0.0 standard drinks    Comment: quit 11/2010  . Drug use: No    Comment: marijuana; quit 3/ 2012  . Sexual activity: Not on file

## 2020-10-10 ENCOUNTER — Encounter: Payer: Self-pay | Admitting: Orthopaedic Surgery

## 2020-10-10 ENCOUNTER — Encounter: Payer: Self-pay | Admitting: Physical Therapy

## 2020-10-10 ENCOUNTER — Other Ambulatory Visit: Payer: Self-pay

## 2020-10-10 ENCOUNTER — Ambulatory Visit: Payer: Medicaid Other | Attending: Orthopaedic Surgery | Admitting: Physical Therapy

## 2020-10-10 DIAGNOSIS — M25561 Pain in right knee: Secondary | ICD-10-CM | POA: Insufficient documentation

## 2020-10-10 DIAGNOSIS — R262 Difficulty in walking, not elsewhere classified: Secondary | ICD-10-CM | POA: Diagnosis present

## 2020-10-10 DIAGNOSIS — M6281 Muscle weakness (generalized): Secondary | ICD-10-CM

## 2020-10-10 DIAGNOSIS — G8929 Other chronic pain: Secondary | ICD-10-CM

## 2020-10-10 DIAGNOSIS — M25562 Pain in left knee: Secondary | ICD-10-CM | POA: Diagnosis present
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
## 2020-10-23 ENCOUNTER — Ambulatory Visit: Payer: Medicaid Other | Admitting: Physical Therapy

## 2020-10-23 ENCOUNTER — Other Ambulatory Visit: Payer: Self-pay

## 2020-10-23 ENCOUNTER — Encounter: Payer: Self-pay | Admitting: Physical Therapy

## 2020-10-23 DIAGNOSIS — G8929 Other chronic pain: Secondary | ICD-10-CM

## 2020-10-23 DIAGNOSIS — M25562 Pain in left knee: Secondary | ICD-10-CM

## 2020-10-23 DIAGNOSIS — M6281 Muscle weakness (generalized): Secondary | ICD-10-CM

## 2020-10-23 DIAGNOSIS — M25561 Pain in right knee: Secondary | ICD-10-CM | POA: Diagnosis not present

## 2020-10-23 DIAGNOSIS — R262 Difficulty in walking, not elsewhere classified: Secondary | ICD-10-CM

## 2020-10-23 NOTE — Therapy (Signed)
[facility] [place], [place], [address] Phone: [phone]   Fax:  [phone]  Physical Therapy Treatment  Patient Details  Name: Mercedes Parks MRN: [id number] Date of Birth: 04/07/1981 Referring Provider (PT): Frankey Shown, MD   Encounter Date: 10/23/2020   PT End of Session - 10/23/20 0810    Visit Number 2    Number of Visits 12    Date for PT Re-Evaluation 11/28/20    Authorization Type Medicaid; Progress note every 10th visit     PT Start Time 0817    PT Stop Time 0856    PT Time Calculation (min) 39 min    Activity Tolerance Patient tolerated treatment well    Behavior During Therapy [facility] for tasks assessed/performed           Past Medical History:  Diagnosis Date  . 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 Seizure [facility])     Past Surgical History:  Procedure Laterality Date  . ABDOMINAL HYSTERECTOMY  2011  . CESAREAN SECTION     @ age 2  . CHOLECYSTECTOMY    . COCHLEAR IMPLANT     @ age 67  . GASTRIC BYPASS  07/05/2016  . PANNICULECTOMY  10/12/2017   Dr. Kellie Simmering  . TONSILECTOMY, ADENOIDECTOMY, BILATERAL MYRINGOTOMY AND TUBES      There were no vitals filed for this visit.   Subjective Assessment - 10/23/20 0810    Subjective COVID 19 screening performed on patient upon arrival. Reports only back pain currently but pain in medial R knee yesterday when statically standing and was sharp and shooting.    Patient is accompained by: Interpreter   Bethena Roys   Pertinent History Epilepsy, chronic bilateral knee pain, hearing loss, cochlear implant    Limitations Standing;Writing    Diagnostic tests x-ray & CT: see imaging tab    Patient Stated Goals workout more    Currently in Pain? No/denies              [facility] PT Assessment - 10/23/20 0001      Assessment   Medical Diagnosis Chronic pain of right knee, Chronic pain of left knee    Referring Provider (PT) Frankey Shown, MD    Onset  Date/Surgical Date 10/11/19    Next MD Visit none    Prior Therapy no      Precautions   Precautions None      Restrictions   Weight Bearing Restrictions No                         [facility] Adult PT Treatment/Exercise - 10/23/20 0001      Exercises   Exercises Knee/Hip      Knee/Hip Exercises: Stretches   Active Hamstring Stretch Right;3 reps;30 seconds    ITB Stretch Right;3 reps;30 seconds    ITB Stretch Limitations in SL off EOB, supine with belt      Knee/Hip Exercises: Aerobic   Stationary Bike L3, seat 3 x10 min      Knee/Hip Exercises: Supine   Short Arc Quad Sets Strengthening;Right;Left;20 reps   x5 reps prior to stretch, x20 reps after stretch   Straight Leg Raises AROM;Right;20 reps      Knee/Hip Exercises: Sidelying   Hip ABduction Strengthening;Right;20 reps      Manual Therapy   Manual Therapy Myofascial release    Myofascial Release MFR/TPR to R ITB to reduce TPs and pain  PT Education - 10/23/20 0856    Education Details HEP- supine HS, ITB stretch    Person(s) Educated Patient    Methods Explanation;Demonstration;Handout    Comprehension Verbalized understanding            PT Short Term Goals - 10/23/20 4132      PT SHORT TERM GOAL #1   Title Patient will be independent with HEP.    Baseline no knowledge of HEP    Time 3    Period Weeks    Status On-going      PT SHORT TERM GOAL #2   Title Patient will report a 10% decrease in bilateral knee pain to improve tolerance for daily activities    Baseline Pain at worst as 7/10    Time 3    Period Weeks    Status On-going             PT Long Term Goals - 10/23/20 4401      PT LONG TERM GOAL #1   Title Patient will be independent with advanced HEP    Baseline no knowledge of HEP    Time 6    Period Weeks    Status On-going      PT LONG TERM GOAL #2   Title Patient will demonstrate 120 degrees of right knee flexion AROM to improve ability to  perform functional tasks    Baseline 102 R knee flexion AROM    Time 6    Period Weeks    Status On-going      PT LONG TERM GOAL #3   Title Patient will demonstrate 4+/5 or greater bilateral LE MMT to improve stability during functional tasks.    Baseline 3/5-4-/5 thorughout bilateral LEs    Time 6    Period Weeks    Status On-going      PT LONG TERM GOAL #4   Title Patient will report ability to perform reciprociating step pattern with use of railing to safely access areas of mom's home.    Baseline step to step pattern.    Time 6    Period Weeks    Status On-going      PT LONG TERM GOAL #5   Title Patient will report ability to perform ADLs and home activities with bilateral knee pain less than or equal to 3/10.    Baseline at worst as 7/10 bilaterally    Time 6    Period Weeks    Status On-going                 Plan - 10/23/20 1124    Clinical Impression Statement Patient presented in clinic with no B knee pain. Patient reports intermittant R medial knee sharp pain and discomfort. Patient limited with SLR and SL R hip abduction due to pain of R hip/ITB. Multiple TPs noted during manual therapy to R ITB. Patient noted difficulty with SL ITB stretching at home due to soft mattress. Patient instructed with supine HS stretch and ITB stretch with belt to which was able to experience a great HS, gastroc stretch. Patient understanding of all HEP instructions. After stretching and TPR to R ITB, patient was able to complete SLR with less pain and improved SL hip abduction although with mid ITB discomfort continued.    Personal Factors and Comorbidities Comorbidity 2;Time since onset of injury/illness/exacerbation;Age    Comorbidities Epilepsy, chronic bilateral knee pain, hearing loss, cochlear implant    Examination-Activity Limitations Locomotion Level;Transfers;Stairs;Stand;Squat    Stability/Clinical  Decision Making Stable/Uncomplicated    Rehab Potential Fair    PT Frequency  2x / week    PT Duration 6 weeks    PT Treatment/Interventions ADLs/Self Care Home Management;Cryotherapy;Electrical Stimulation;Iontophoresis 4mg /ml Dexamethasone;Moist Heat;Gait training;Stair training;Functional mobility training;Therapeutic activities;Therapeutic exercise;Balance training;Neuromuscular re-education;Manual techniques;Passive range of motion;Patient/family education;Vasopneumatic Device;Taping    PT Next Visit Plan nustep, bike, pain free strengthening, pain free ROM,  modalities PRN for pain relief, kinesiotaping for patella tracking    PT Home Exercise Plan see patient education section    Consulted and Agree with Plan of Care Patient           Patient will benefit from skilled therapeutic intervention in order to improve the following deficits and impairments:  Decreased activity tolerance,Decreased balance,Abnormal gait,Difficulty walking,Decreased range of motion,Pain,Decreased strength,Increased edema,Decreased mobility  Visit Diagnosis: Chronic pain of right knee  Chronic pain of left knee  Muscle weakness (generalized)  Difficulty in walking, not elsewhere classified     Problem List Patient Active Problem List   Diagnosis Date Noted  . Panniculitis 09/30/2020  . Insect bite 06/26/2018  . Urticaria 01/24/2018  . Seasonal and perennial allergic rhinitis 01/24/2018  . Allergic conjunctivitis 01/24/2018  . Coughing 01/24/2018  . Fatigue 04/05/2016  . Hypersomnia with sleep apnea 04/05/2016  . Snoring 04/05/2016  . Hearing loss sensory, bilateral 04/05/2016  . Constipation 07/14/2015  . Abdominal pain 07/14/2015  . Generalized idiopathic epilepsy, not intractable, without status epilepticus ([place]) 02/03/2015    Standley Brooking, PTA 10/23/2020, 11:32 AM  [facility] [address] [place], [place], [address] Phone: [phone]   Fax:  [phone]  Name: Mercedes Parks MRN: [id number] Date of Birth:  Jan 17, 1981

## 2020-10-28 ENCOUNTER — Ambulatory Visit: Payer: Medicaid Other | Attending: Orthopaedic Surgery | Admitting: Physical Therapy

## 2020-10-28 ENCOUNTER — Encounter: Payer: Self-pay | Admitting: Physical Therapy

## 2020-10-28 ENCOUNTER — Other Ambulatory Visit: Payer: Self-pay

## 2020-10-28 DIAGNOSIS — G8929 Other chronic pain: Secondary | ICD-10-CM | POA: Diagnosis present

## 2020-10-28 DIAGNOSIS — M6281 Muscle weakness (generalized): Secondary | ICD-10-CM | POA: Insufficient documentation

## 2020-10-28 DIAGNOSIS — M25562 Pain in left knee: Secondary | ICD-10-CM | POA: Insufficient documentation

## 2020-10-28 DIAGNOSIS — R262 Difficulty in walking, not elsewhere classified: Secondary | ICD-10-CM | POA: Diagnosis present

## 2020-10-28 DIAGNOSIS — M25561 Pain in right knee: Secondary | ICD-10-CM | POA: Diagnosis present

## 2020-10-28 NOTE — Therapy (Signed)
[facility] [place], [place], [address] Phone: [phone]   Fax:  [phone]  Physical Therapy Treatment  Patient Details  Name: Mercedes Parks MRN: [id number] Date of Birth: 22-Jan-1981 Referring Provider (PT): Frankey Shown, MD   Encounter Date: 10/28/2020   PT End of Session - 10/28/20 9379    Visit Number 3    Number of Visits 12    Date for PT Re-Evaluation 11/28/20    Authorization Type Medicaid; Progress note every 10th visit     PT Start Time 0816    PT Stop Time 0908    PT Time Calculation (min) 52 min    Activity Tolerance Patient tolerated treatment well    Behavior During Therapy [facility] for tasks assessed/performed           Past Medical History:  Diagnosis Date  . 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 Seizure [facility])     Past Surgical History:  Procedure Laterality Date  . ABDOMINAL HYSTERECTOMY  2011  . CESAREAN SECTION     @ age 14  . CHOLECYSTECTOMY    . COCHLEAR IMPLANT     @ age 22  . GASTRIC BYPASS  07/05/2016  . PANNICULECTOMY  10/12/2017   Dr. Kellie Simmering  . TONSILECTOMY, ADENOIDECTOMY, BILATERAL MYRINGOTOMY AND TUBES      There were no vitals filed for this visit.   Subjective Assessment - 10/28/20 0818    Subjective COVID 19 screening performed on patient upon arrival. Reports ITB is still tight but knee is not really hurting.    Patient is accompained by: Interpreter   Clyde Canterbury   Pertinent History Epilepsy, chronic bilateral knee pain, hearing loss, cochlear implant    Limitations Standing;Writing    Diagnostic tests x-ray & CT: see imaging tab    Patient Stated Goals workout more    Currently in Pain? No/denies              [facility] PT Assessment - 10/28/20 0001      Assessment   Medical Diagnosis Chronic pain of right knee, Chronic pain of left knee    Referring Provider (PT) Frankey Shown, MD    Onset Date/Surgical Date 10/11/19    Next MD Visit none    Prior Therapy no       Precautions   Precautions None      Restrictions   Weight Bearing Restrictions No                         [facility] Adult PT Treatment/Exercise - 10/28/20 0001      Knee/Hip Exercises: Stretches   Active Hamstring Stretch Right;3 reps;30 seconds    ITB Stretch Right;3 reps;30 seconds    ITB Stretch Limitations supine with belt      Knee/Hip Exercises: Aerobic   Stationary Bike L3, seat 3 x10 min      Knee/Hip Exercises: Standing   Terminal Knee Extension Strengthening;Both;20 reps;Limitations    Terminal Knee Extension Limitations Orange XTS; patient able to experience more quad sensation in LLE      Knee/Hip Exercises: Supine   Bridges Strengthening;20 reps    Straight Leg Raises AROM;Both;2 sets;10 reps    Straight Leg Raise with External Rotation AROM;Both;2 sets;10 reps      Knee/Hip Exercises: Sidelying   Hip ABduction Strengthening;Right;10 reps;Left;20 reps   RLE limited by pain     Manual Therapy   Manual Therapy Taping  Kinesiotex Ligament Correction   patella tracking of R knee                   PT Short Term Goals - 10/23/20 [id number]      PT SHORT TERM GOAL #1   Title Patient will be independent with HEP.    Baseline no knowledge of HEP    Time 3    Period Weeks    Status On-going      PT SHORT TERM GOAL #2   Title Patient will report a 10% decrease in bilateral knee pain to improve tolerance for daily activities    Baseline Pain at worst as 7/10    Time 3    Period Weeks    Status On-going             PT Long Term Goals - 10/23/20 [id number]      PT LONG TERM GOAL #1   Title Patient will be independent with advanced HEP    Baseline no knowledge of HEP    Time 6    Period Weeks    Status On-going      PT LONG TERM GOAL #2   Title Patient will demonstrate 120 degrees of right knee flexion AROM to improve ability to perform functional tasks    Baseline 102 R knee flexion AROM    Time 6    Period Weeks    Status On-going       PT LONG TERM GOAL #3   Title Patient will demonstrate 4+/5 or greater bilateral LE MMT to improve stability during functional tasks.    Baseline 3/5-4-/5 thorughout bilateral LEs    Time 6    Period Weeks    Status On-going      PT LONG TERM GOAL #4   Title Patient will report ability to perform reciprociating step pattern with use of railing to safely access areas of mom's home.    Baseline step to step pattern.    Time 6    Period Weeks    Status On-going      PT LONG TERM GOAL #5   Title Patient will report ability to perform ADLs and home activities with bilateral knee pain less than or equal to 3/10.    Baseline at worst as 7/10 bilaterally    Time 6    Period Weeks    Status On-going                 Plan - 10/28/20 [id number]    Clinical Impression Statement Patient presented in clinic with reports of no knee pain but some L knee tightness. Patient has signed up for [employer] and is going to take some of their classes. Patient guided through light quad strengthening exercises to assist in improving patellar tracking. Patient's R ITB discomfort still limiting for SL R hip abduction. Patient compliant with HEP stretching. Patient educated regarding taping for knee stability and patellar tracking. Patient to assess response to taping while at workouts. Patient requested for written notes to personal trainer at [facility] for guidance regarding patellar tracking, monitor squat technique and correct along with ITB pain.    Personal Factors and Comorbidities Comorbidity 2;Time since onset of injury/illness/exacerbation;Age    Comorbidities Epilepsy, chronic bilateral knee pain, hearing loss, cochlear implant    Examination-Activity Limitations Locomotion Level;Transfers;Stairs;Stand;Squat    Stability/Clinical Decision Making Stable/Uncomplicated    Rehab Potential Fair    PT Frequency 2x / week    PT Duration  6 weeks    PT Treatment/Interventions ADLs/Self Care Home  Management;Cryotherapy;Electrical Stimulation;Iontophoresis 4mg /ml Dexamethasone;Moist Heat;Gait training;Stair training;Functional mobility training;Therapeutic activities;Therapeutic exercise;Balance training;Neuromuscular re-education;Manual techniques;Passive range of motion;Patient/family education;Vasopneumatic Device;Taping    PT Next Visit Plan nustep, bike, pain free strengthening, pain free ROM,  modalities PRN for pain relief, kinesiotaping for patella tracking    PT Home Exercise Plan see patient education section    Consulted and Agree with Plan of Care Patient           Patient will benefit from skilled therapeutic intervention in order to improve the following deficits and impairments:  Decreased activity tolerance,Decreased balance,Abnormal gait,Difficulty walking,Decreased range of motion,Pain,Decreased strength,Increased edema,Decreased mobility  Visit Diagnosis: Chronic pain of right knee  Chronic pain of left knee  Muscle weakness (generalized)  Difficulty in walking, not elsewhere classified     Problem List Patient Active Problem List   Diagnosis Date Noted  . Panniculitis 09/30/2020  . Insect bite 06/26/2018  . Urticaria 01/24/2018  . Seasonal and perennial allergic rhinitis 01/24/2018  . Allergic conjunctivitis 01/24/2018  . Coughing 01/24/2018  . Fatigue 04/05/2016  . Hypersomnia with sleep apnea 04/05/2016  . Snoring 04/05/2016  . Hearing loss sensory, bilateral 04/05/2016  . Constipation 07/14/2015  . Abdominal pain 07/14/2015  . Generalized idiopathic epilepsy, not intractable, without status epilepticus ([place]) 02/03/2015    Standley Brooking, PTA 10/28/2020, 9:32 AM  [facility] [address] [place], [place], [address] Phone: [phone]   Fax:  [phone]  Name: Mercedes Parks MRN: [id number] Date of Birth: Jan 27, 1981

## 2020-10-30 ENCOUNTER — Other Ambulatory Visit: Payer: Self-pay

## 2020-10-30 ENCOUNTER — Ambulatory Visit: Payer: Medicaid Other | Admitting: Physical Therapy

## 2020-10-30 ENCOUNTER — Encounter: Payer: Self-pay | Admitting: Physical Therapy

## 2020-10-30 DIAGNOSIS — M25561 Pain in right knee: Secondary | ICD-10-CM | POA: Diagnosis not present

## 2020-10-30 DIAGNOSIS — G8929 Other chronic pain: Secondary | ICD-10-CM

## 2020-10-30 DIAGNOSIS — R262 Difficulty in walking, not elsewhere classified: Secondary | ICD-10-CM

## 2020-10-30 DIAGNOSIS — M6281 Muscle weakness (generalized): Secondary | ICD-10-CM
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
## 2020-11-18 ENCOUNTER — Ambulatory Visit: Payer: Medicaid Other | Admitting: Physical Therapy

## 2020-11-18 ENCOUNTER — Encounter: Payer: Self-pay | Admitting: Physical Therapy

## 2020-11-18 ENCOUNTER — Other Ambulatory Visit: Payer: Self-pay

## 2020-11-18 DIAGNOSIS — M6281 Muscle weakness (generalized): Secondary | ICD-10-CM

## 2020-11-18 DIAGNOSIS — M25562 Pain in left knee: Secondary | ICD-10-CM

## 2020-11-18 DIAGNOSIS — M25561 Pain in right knee: Secondary | ICD-10-CM | POA: Diagnosis not present

## 2020-11-18 DIAGNOSIS — R262 Difficulty in walking, not elsewhere classified: Secondary | ICD-10-CM

## 2020-11-18 DIAGNOSIS — G8929 Other chronic pain: Secondary | ICD-10-CM

## 2020-11-18 NOTE — Patient Instructions (Signed)
[facility]  DRY NEEDLING CONSENT FORM   Trigger point dry needling is a physical therapy approach to treat Myofascial Pain and Dysfunction.  Dry Needling (DN) is a valuable and effective way to deactivate myofascial trigger points (muscle knots). It is skilled intervention that uses a thin filiform needle to penetrate the skin and stimulate underlying myofascial trigger points, muscular, and connective tissues for the management of neuromusculoskeletal pain and movement impairments.  A local twitch response (LTR) will be elicited.  This can sometimes feel like a deep ache in the muscle during the procedure. Multiple trigger points in multiple muscles can be treated during each treatment.  No medication of any kind is injected.   As with any medical treatment and procedure, there are possible adverse events.  While significant adverse events are uncommon, they do sometimes occur and must be considered prior to giving consent.  1. Dry needling often causes a "post needling soreness".  There can be an increase in pain from a couple of hours to 2-3 days, followed by an improvement in the overall pain state. 2. Any time a needle is used there is a risk of infection.  However, we are using new, sterile, and disposable needles; infections are extremely rare. 3. There is a possibility that you may bleed or bruise.  You may feel tired and some nausea following treatment. 4. There is a rare possibility of a pneumothorax (air in the chest cavity). 5. Allergic reaction to nickel in the stainless steel needle. 6. If a nerve is touched, it may cause paresthesia (a prickling/shock sensation) which is usually brief, but may continue for a couple of days.  Following treatment stay hydrated.  Continue regular activities but not too vigorous initially after treatment for 24-48 hours. You may apply heat to sore muscles.  Dry Needling is best when combined with other physical therapy  interventions such as strengthening, stretching and other therapeutic modalities.     PLEASE ANSWER THE FOLLOWING QUESTIONS:  Do you have a lack of sensation?   Y/N  Do you have a phobia or fear of needles  Y/N  Are you pregnant?    Y/N If yes:  How many weeks? _____  Do you have any implanted devices?  Y/N If yes:  Pacemaker/Spinal Cord         Stimulator/Deep Brain         Stimulator/Insulin          Pump/Other: ____________ Do you have any implants?   Y/N If yes:      Do you take any blood thinners?   Y/N If yes: Coumadin          (Warfarin)/Other:  Do you have a bleeding disorder?   Y/N If yes: What kind:   Do you take any immunosuppressants?  Y/N If yes:   What kind:   Do you take anti-inflammatories?   Y/N If yes: What kind:  Have you ever been diagnosed with Scoliosis? Y/N  Have you had back surgery?    Y/N If yes:         Laminectomy/Fusion/Other:   I have read, or had read to me, the above.  I have had the opportunity to ask any questions.  All of my questions have been answered to my satisfaction and I understand the risks involved with dry needling.  I consent to examination and treatment at [facility], including dry needling, of any and all of my involved and affected   muscles.   

## 2020-11-18 NOTE — Therapy (Signed)
[facility] [place], [place], [address] Phone: [phone]   Fax:  [phone]  Physical Therapy Treatment  Patient Details  Name: Mercedes Parks MRN: [id number] Date of Birth: October 16, 1980 Referring Provider (PT): Frankey Shown, MD   Encounter Date: 11/18/2020   PT End of Session - 11/18/20 0916    Visit Number 5    Number of Visits 12    Date for PT Re-Evaluation 11/28/20    Authorization Type Medicaid; Progress note every 10th visit     PT Start Time 0816    PT Stop Time 0858    PT Time Calculation (min) 42 min    Activity Tolerance Patient tolerated treatment well    Behavior During Therapy [facility] for tasks assessed/performed           Past Medical History:  Diagnosis Date  . 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 Seizure [facility])     Past Surgical History:  Procedure Laterality Date  . ABDOMINAL HYSTERECTOMY  2011  . CESAREAN SECTION     @ age 40  . CHOLECYSTECTOMY    . COCHLEAR IMPLANT     @ age 40  . GASTRIC BYPASS  07/05/2016  . PANNICULECTOMY  10/12/2017   Dr. Kellie Simmering  . TONSILECTOMY, ADENOIDECTOMY, BILATERAL MYRINGOTOMY AND TUBES      There were no vitals filed for this visit.   Subjective Assessment - 11/18/20 0816    Subjective COVID 19 screening performed on patient upon arrival. Patient reports that her knee is sore but the classes at [employer] are going well. Reports knee pain is still about the same.    Patient is accompained by: Interpreter   [name]   Pertinent History Epilepsy, chronic bilateral knee pain, hearing loss, cochlear implant    Limitations Standing;Writing    Diagnostic tests x-ray & CT: see imaging tab    Patient Stated Goals workout more    Currently in Pain? Yes    Pain Score 3     Pain Location Knee    Pain Orientation Right    Pain Descriptors / Indicators Sore    Pain Type Chronic pain    Pain Onset More than a month ago    Pain Frequency Constant               [facility] PT Assessment - 11/18/20 0001      Assessment   Medical Diagnosis Chronic pain of right knee, Chronic pain of left knee    Referring Provider (PT) Frankey Shown, MD    Onset Date/Surgical Date 10/11/19    Next MD Visit none    Prior Therapy no      Precautions   Precautions None      Restrictions   Weight Bearing Restrictions No                         [facility] Adult PT Treatment/Exercise - 11/18/20 0001      Knee/Hip Exercises: Aerobic   Stationary Bike L5 x10 min      Knee/Hip Exercises: Machines for Strengthening   Cybex Knee Extension 10# 2x10 rpes    Cybex Knee Flexion 40# 3x10 reps    Cybex Leg Press 3.5 pl, seat 7 x20 reps      Knee/Hip Exercises: Standing   Forward Lunges Both;10 reps;2 seconds   10# kettlebell; mirror utilized   Step Down Both;15 reps;Hand Hold: 2;Step Height: 6"  Step Down Limitations B heel dot 4" step x15 reps each    Functional Squat 15 reps;3 seconds   with green clam   Walking with Sports Cord 3D walking orange XTS for hip strengthening x5 reps each      Knee/Hip Exercises: Seated   Sit to Sand 15 reps;without UE support   BLE split squat     Knee/Hip Exercises: Sidelying   Hip ABduction Strengthening;Both;15 reps                    PT Short Term Goals - 10/30/20 0824      PT SHORT TERM GOAL #1   Title Patient will be independent with HEP.    Baseline no knowledge of HEP    Time 3    Period Weeks    Status Achieved      PT SHORT TERM GOAL #2   Title Patient will report a 10% decrease in bilateral knee pain to improve tolerance for daily activities    Baseline Pain at worst as 7/10    Time 3    Period Weeks    Status Partially Met   Less muscle tightness, strength increased per patient report 10/30/2020            PT Long Term Goals - 10/23/20 0823      PT LONG TERM GOAL #1   Title Patient will be independent with advanced HEP    Baseline no knowledge of HEP    Time 6    Period Weeks     Status On-going      PT LONG TERM GOAL #2   Title Patient will demonstrate 120 degrees of right knee flexion AROM to improve ability to perform functional tasks    Baseline 102 R knee flexion AROM    Time 6    Period Weeks    Status On-going      PT LONG TERM GOAL #3   Title Patient will demonstrate 4+/5 or greater bilateral LE MMT to improve stability during functional tasks.    Baseline 3/5-4-/5 thorughout bilateral LEs    Time 6    Period Weeks    Status On-going      PT LONG TERM GOAL #4   Title Patient will report ability to perform reciprociating step pattern with use of railing to safely access areas of mom's home.    Baseline step to step pattern.    Time 6    Period Weeks    Status On-going      PT LONG TERM GOAL #5   Title Patient will report ability to perform ADLs and home activities with bilateral knee pain less than or equal to 3/10.    Baseline at worst as 7/10 bilaterally    Time 6    Period Weeks    Status On-going                 Plan - 11/18/20 1132    Clinical Impression Statement Patient presented in clinic with reports of continued knee pain. Patient able to progress with machine strengthening as well as into more functional strengthening. Hip weakness notable with forward lunges as mirror utilized for patient to visually see IR. Balance was reported by patient as greatest limitation. Squats with resistance was observed with great technique. Mostly muscle fatigue reported during treatment. Patient also provided DN consent form per authorization by MD.    Personal Factors and Comorbidities Comorbidity 2;Time since onset of injury/illness/exacerbation;Age  Comorbidities Epilepsy, chronic bilateral knee pain, hearing loss, cochlear implant    Examination-Activity Limitations Locomotion Level;Transfers;Stairs;Stand;Squat    Stability/Clinical Decision Making Stable/Uncomplicated    Rehab Potential Fair    PT Frequency 2x / week    PT Duration 6  weeks    PT Treatment/Interventions ADLs/Self Care Home Management;Cryotherapy;Electrical Stimulation;Iontophoresis 93m/ml Dexamethasone;Moist Heat;Gait training;Stair training;Functional mobility training;Therapeutic activities;Therapeutic exercise;Balance training;Neuromuscular re-education;Manual techniques;Passive range of motion;Patient/family education;Vasopneumatic Device;Taping;Dry needling    PT Next Visit Plan nustep, bike, pain free strengthening, pain free ROM,  modalities PRN for pain relief, kinesiotaping for patella tracking    PT Home Exercise Plan see patient education section    Consulted and Agree with Plan of Care Patient           Patient will benefit from skilled therapeutic intervention in order to improve the following deficits and impairments:  Decreased activity tolerance,Decreased balance,Abnormal gait,Difficulty walking,Decreased range of motion,Pain,Decreased strength,Increased edema,Decreased mobility  Visit Diagnosis: Chronic pain of right knee  Chronic pain of left knee  Muscle weakness (generalized)  Difficulty in walking, not elsewhere classified     Problem List Patient Active Problem List   Diagnosis Date Noted  . Panniculitis 09/30/2020  . Insect bite 06/26/2018  . Urticaria 01/24/2018  . Seasonal and perennial allergic rhinitis 01/24/2018  . Allergic conjunctivitis 01/24/2018  . Coughing 01/24/2018  . Fatigue 04/05/2016  . Hypersomnia with sleep apnea 04/05/2016  . Snoring 04/05/2016  . Hearing loss sensory, bilateral 04/05/2016  . Constipation 07/14/2015  . Abdominal pain 07/14/2015  . Generalized idiopathic epilepsy, not intractable, without status epilepticus ([facility] 02/03/2015    KStandley Brooking PTA 11/18/2020, 11:38 AM  [facility]MH[place] [address]Phone: [phone]  Fax:  [phone] Name: HCYRENA KUCHENBECKERMRN: [id number] of Birth:  108-09-1980

## 2020-11-20 ENCOUNTER — Encounter: Payer: Self-pay | Admitting: Physical Therapy

## 2020-11-20 ENCOUNTER — Ambulatory Visit: Payer: Medicaid Other | Admitting: Physical Therapy

## 2020-11-20 ENCOUNTER — Other Ambulatory Visit: Payer: Self-pay

## 2020-11-20 DIAGNOSIS — R262 Difficulty in walking, not elsewhere classified: Secondary | ICD-10-CM

## 2020-11-20 DIAGNOSIS — M25561 Pain in right knee: Secondary | ICD-10-CM | POA: Diagnosis not present

## 2020-11-20 DIAGNOSIS — M6281 Muscle weakness (generalized): Secondary | ICD-10-CM

## 2020-11-20 DIAGNOSIS — G8929 Other chronic pain: Secondary | ICD-10-CM

## 2020-11-20 DIAGNOSIS — M25562 Pain in left knee: Secondary | ICD-10-CM

## 2020-11-20 NOTE — Therapy (Signed)
[facility] [place], [place], [address] Phone: [phone]   Fax:  [phone]  Physical Therapy Treatment  Patient Details  Name: Mercedes Parks MRN: [id number] Date of Birth: 06-10-1981 Referring Provider (PT): Frankey Shown, MD   Encounter Date: 11/20/2020   PT End of Session - 11/20/20 0903    Visit Number 6    Number of Visits 12    Date for PT Re-Evaluation 11/28/20    Authorization Type Medicaid; Progress note every 10th visit    PT Start Time 0816    PT Stop Time 0900    PT Time Calculation (min) 44 min    Activity Tolerance Patient tolerated treatment well    Behavior During Therapy [facility] for tasks assessed/performed           Past Medical History:  Diagnosis Date  . 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 Seizure [facility])     Past Surgical History:  Procedure Laterality Date  . ABDOMINAL HYSTERECTOMY  2011  . CESAREAN SECTION     @ age 20  . CHOLECYSTECTOMY    . COCHLEAR IMPLANT     @ age 59  . GASTRIC BYPASS  07/05/2016  . PANNICULECTOMY  10/12/2017   Dr. Kellie Simmering  . TONSILECTOMY, ADENOIDECTOMY, BILATERAL MYRINGOTOMY AND TUBES      There were no vitals filed for this visit.   Subjective Assessment - 11/20/20 0821    Subjective COVID 19 screening performed on patient upon arrival. Reports that her leg is very sore and painful.    Patient is accompained by: Interpreter   Clyde Canterbury   Pertinent History Epilepsy, chronic bilateral knee pain, hearing loss, cochlear implant    Limitations Standing;Writing    Diagnostic tests x-ray & CT: see imaging tab    Patient Stated Goals workout more    Currently in Pain? Yes    Pain Score 2     Pain Location Leg    Pain Orientation Right    Pain Descriptors / Indicators Tightness    Pain Type Chronic pain    Pain Onset More than a month ago    Pain Frequency Constant              [facility] PT Assessment - 11/20/20 0001      Assessment   Medical  Diagnosis Chronic pain of right knee, Chronic pain of left knee    Referring Provider (PT) Frankey Shown, MD    Onset Date/Surgical Date 10/11/19    Next MD Visit none    Prior Therapy no      Precautions   Precautions None      Restrictions   Weight Bearing Restrictions No                         [facility] Adult PT Treatment/Exercise - 11/20/20 0001      Knee/Hip Exercises: Aerobic   Stationary Bike L5 x10 min      Knee/Hip Exercises: Machines for Strengthening   Cybex Leg Press 3.5 pl, seat 7 x20 reps      Knee/Hip Exercises: Standing   Forward Lunges Both;10 reps;2 seconds    Forward Lunges Limitations 10#    Functional Squat 20 reps;3 seconds    Functional Squat Limitations 10#      Manual Therapy   Manual Therapy Myofascial release    Myofascial Release IASTW to R ITB, L pes anserine to reduce inflammation and  adhesions                  PT Education - 11/20/20 0939    Education Details Massage roller, self massage education (technique), DN/acupuncture education, bursa/ IASTW education    Person(s) Educated Patient    Methods Explanation;Demonstration    Comprehension Verbalized understanding            PT Short Term Goals - 10/30/20 0824      PT SHORT TERM GOAL #1   Title Patient will be independent with HEP.    Baseline no knowledge of HEP    Time 3    Period Weeks    Status Achieved      PT SHORT TERM GOAL #2   Title Patient will report a 10% decrease in bilateral knee pain to improve tolerance for daily activities    Baseline Pain at worst as 7/10    Time 3    Period Weeks    Status Partially Met   Less muscle tightness, strength increased per patient report 10/30/2020            PT Long Term Goals - 10/23/20 0823      PT LONG TERM GOAL #1   Title Patient will be independent with advanced HEP    Baseline no knowledge of HEP    Time 6    Period Weeks    Status On-going      PT LONG TERM GOAL #2   Title Patient will  demonstrate 120 degrees of right knee flexion AROM to improve ability to perform functional tasks    Baseline 102 R knee flexion AROM    Time 6    Period Weeks    Status On-going      PT LONG TERM GOAL #3   Title Patient will demonstrate 4+/5 or greater bilateral LE MMT to improve stability during functional tasks.    Baseline 3/5-4-/5 thorughout bilateral LEs    Time 6    Period Weeks    Status On-going      PT LONG TERM GOAL #4   Title Patient will report ability to perform reciprociating step pattern with use of railing to safely access areas of mom's home.    Baseline step to step pattern.    Time 6    Period Weeks    Status On-going      PT LONG TERM GOAL #5   Title Patient will report ability to perform ADLs and home activities with bilateral knee pain less than or equal to 3/10.    Baseline at worst as 7/10 bilaterally    Time 6    Period Weeks    Status On-going                 Plan - 11/20/20 0931    Clinical Impression Statement Patient presented in clinic with continued tightness of R ITB but relatively low level pain. Patient educated on different techniques for self massage using foam roller or self massage. Patient able to demonstrate improved balance and technique with lunges and squats with kettlebell. IASTW completed to R ITB with greatest response to mid-distal ITB. Patient repotred sensitivity and discomfort during treatment. Patient had slight redness and light bruising present over treated areas. Pes Anserine bursa inflammation palpable but hardened under L inferior knee scarring. Hardened region noted measuring 2.5 cm L x 1.5 cm W. Patient educated of normal response to PT with use of heat or ice periodically throughout the day for  20 minutes max.    Personal Factors and Comorbidities Comorbidity 2;Time since onset of injury/illness/exacerbation;Age    Comorbidities Epilepsy, chronic bilateral knee pain, hearing loss, cochlear implant     Examination-Activity Limitations Locomotion Level;Transfers;Stairs;Stand;Squat    Stability/Clinical Decision Making Stable/Uncomplicated    Rehab Potential Fair    PT Frequency 2x / week    PT Duration 6 weeks    PT Treatment/Interventions ADLs/Self Care Home Management;Cryotherapy;Electrical Stimulation;Iontophoresis 50m/ml Dexamethasone;Moist Heat;Gait training;Stair training;Functional mobility training;Therapeutic activities;Therapeutic exercise;Balance training;Neuromuscular re-education;Manual techniques;Passive range of motion;Patient/family education;Vasopneumatic Device;Taping;Dry needling    PT Next Visit Plan nustep, bike, pain free strengthening, pain free ROM,  modalities PRN for pain relief, kinesiotaping for patella tracking    PT Home Exercise Plan see patient education section    Consulted and Agree with Plan of Care Patient           Patient will benefit from skilled therapeutic intervention in order to improve the following deficits and impairments:  Decreased activity tolerance,Decreased balance,Abnormal gait,Difficulty walking,Decreased range of motion,Pain,Decreased strength,Increased edema,Decreased mobility  Visit Diagnosis: Chronic pain of right knee  Chronic pain of left knee  Muscle weakness (generalized)  Difficulty in walking, not elsewhere classified     Problem List Patient Active Problem List   Diagnosis Date Noted  . Panniculitis 09/30/2020  . Insect bite 06/26/2018  . Urticaria 01/24/2018  . Seasonal and perennial allergic rhinitis 01/24/2018  . Allergic conjunctivitis 01/24/2018  . Coughing 01/24/2018  . Fatigue 04/05/2016  . Hypersomnia with sleep apnea 04/05/2016  . Snoring 04/05/2016  . Hearing loss sensory, bilateral 04/05/2016  . Constipation 07/14/2015  . Abdominal pain 07/14/2015  . Generalized idiopathic epilepsy, not intractable, without status epilepticus ([facility] 02/03/2015    KStandley Brooking PTA 11/20/2020, 9:40 AM  [facility] [address]Phone: [phone]  Fax:  [phone] Name: Mercedes SOLARMRN: [id number] of Birth: 126-Jun-1982

## 2020-11-25 ENCOUNTER — Ambulatory Visit: Payer: Medicaid Other | Attending: Orthopaedic Surgery | Admitting: Physical Therapy

## 2020-11-25 ENCOUNTER — Encounter: Payer: Self-pay | Admitting: Physical Therapy

## 2020-11-25 ENCOUNTER — Other Ambulatory Visit: Payer: Self-pay

## 2020-11-25 DIAGNOSIS — M25562 Pain in left knee: Secondary | ICD-10-CM | POA: Diagnosis present

## 2020-11-25 DIAGNOSIS — M6281 Muscle weakness (generalized): Secondary | ICD-10-CM | POA: Diagnosis present

## 2020-11-25 DIAGNOSIS — G8929 Other chronic pain: Secondary | ICD-10-CM | POA: Insufficient documentation

## 2020-11-25 DIAGNOSIS — M25561 Pain in right knee: Secondary | ICD-10-CM | POA: Insufficient documentation

## 2020-11-25 DIAGNOSIS — R262 Difficulty in walking, not elsewhere classified: Secondary | ICD-10-CM | POA: Insufficient documentation

## 2020-11-25 NOTE — Therapy (Signed)
[facility] [place], [place], [address] Phone: [phone]   Fax:  [phone]  Physical Therapy Treatment  Patient Details  Name: Mercedes Parks MRN: [id number] Date of Birth: [birth year] Referring Provider (PT): Frankey Shown, MD   Encounter Date: 11/25/2020   PT End of Session - 11/25/20 0819    Visit Number 7    Number of Visits 12    Date for PT Re-Evaluation 11/28/20    Authorization Type Medicaid; 11/10/2020-12/07/2020 Progress note every 10th visit     Authorization - Visit Number 3    Authorization - Number of Visits 8    PT Start Time 0816    PT Stop Time 0902    PT Time Calculation (min) 46 min    Activity Tolerance Patient tolerated treatment well    Behavior During Therapy [facility] for tasks assessed/performed           Past Medical History:  Diagnosis Date  . 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 Seizure [facility])     Past Surgical History:  Procedure Laterality Date  . ABDOMINAL HYSTERECTOMY  2011  . CESAREAN SECTION     @ age 29  . CHOLECYSTECTOMY    . COCHLEAR IMPLANT     @ age 66  . GASTRIC BYPASS  07/05/2016  . PANNICULECTOMY  10/12/2017   Dr. Kellie Simmering  . TONSILECTOMY, ADENOIDECTOMY, BILATERAL MYRINGOTOMY AND TUBES      There were no vitals filed for this visit.   Subjective Assessment - 11/25/20 0818    Subjective COVID 19 screening performed on patient upon arrival. Patient arrives with reports of 5/10.    Patient is accompained by: Interpreter   Clyde Canterbury   Pertinent History Epilepsy, chronic bilateral knee pain, hearing loss, cochlear implant    Limitations Standing;Writing    Diagnostic tests x-ray & CT: see imaging tab    Patient Stated Goals workout more    Currently in Pain? Yes    Pain Score 5     Pain Location Leg    Pain Orientation Right    Pain Descriptors / Indicators Tightness    Pain Type Chronic pain    Pain Onset More than a month ago    Pain Frequency Constant               [facility] PT Assessment - 11/25/20 0001      Assessment   Medical Diagnosis Chronic pain of right knee, Chronic pain of left knee    Referring Provider (PT) Frankey Shown, MD    Onset Date/Surgical Date 10/11/19    Next MD Visit none    Prior Therapy no      Precautions   Precautions None                         [facility] Adult PT Treatment/Exercise - 11/25/20 0001      Knee/Hip Exercises: Aerobic   Stationary Bike L5 x10 min      Knee/Hip Exercises: Machines for Strengthening   Cybex Leg Press 3.5 pl, seat 7 x20 reps      Knee/Hip Exercises: Standing   Forward Lunges Both;10 reps;2 seconds    Forward Lunges Limitations reverse lunge no weight    Wall Squat 2 sets;10 reps;3 seconds;Other (comment)    Wall Squat Limitations 10# kettle bell    Other Standing Knee Exercises curtsey lunge 2x10 each      Manual Therapy  Manual Therapy Myofascial release    Myofascial Release IASTW to bilateral ITB, L pes anserine to reduce inflammation and adhesions                    PT Short Term Goals - 10/30/20 7672      PT SHORT TERM GOAL #1   Title Patient will be independent with HEP.    Baseline no knowledge of HEP    Time 3    Period Weeks    Status Achieved      PT SHORT TERM GOAL #2   Title Patient will report a 10% decrease in bilateral knee pain to improve tolerance for daily activities    Baseline Pain at worst as 7/10    Time 3    Period Weeks    Status Partially Met   Less muscle tightness, strength increased per patient report 10/30/2020            PT Long Term Goals - 10/23/20 0823      PT LONG TERM GOAL #1   Title Patient will be independent with advanced HEP    Baseline no knowledge of HEP    Time 6    Period Weeks    Status On-going      PT LONG TERM GOAL #2   Title Patient will demonstrate 120 degrees of right knee flexion AROM to improve ability to perform functional tasks    Baseline 102 R knee flexion AROM    Time 6     Period Weeks    Status On-going      PT LONG TERM GOAL #3   Title Patient will demonstrate 4+/5 or greater bilateral LE MMT to improve stability during functional tasks.    Baseline 3/5-4-/5 thorughout bilateral LEs    Time 6    Period Weeks    Status On-going      PT LONG TERM GOAL #4   Title Patient will report ability to perform reciprociating step pattern with use of railing to safely access areas of mom's home.    Baseline step to step pattern.    Time 6    Period Weeks    Status On-going      PT LONG TERM GOAL #5   Title Patient will report ability to perform ADLs and home activities with bilateral knee pain less than or equal to 3/10.    Baseline at worst as 7/10 bilaterally    Time 6    Period Weeks    Status On-going                 Plan - 11/25/20 1425    Clinical Impression Statement Patient arrives to physical therapy with ongoing R ITB tightness. Patient was able to complete TEs though limited with balance with reverse lung with 10# kettle bell. Improved form and balance without weight. Patient demonstrated excellent form with wall squats with 10# kettle bell. STW/M performed only to right ITB secondary to some residual bruising from last session thought with good response. L ITB assessed with less adhesions; IASTM provided with good erythema. Patient educated may be bruised on this L ITB as well after IASTM.    Personal Factors and Comorbidities Comorbidity 2;Time since onset of injury/illness/exacerbation;Age    Comorbidities Epilepsy, chronic bilateral knee pain, hearing loss, cochlear implant    Examination-Activity Limitations Locomotion Level;Transfers;Stairs;Stand;Squat    Stability/Clinical Decision Making Stable/Uncomplicated    Clinical Decision Making Low    Rehab Potential Fair  PT Frequency 2x / week    PT Duration 6 weeks    PT Treatment/Interventions ADLs/Self Care Home Management;Cryotherapy;Electrical Stimulation;Iontophoresis 60m/ml  Dexamethasone;Moist Heat;Gait training;Stair training;Functional mobility training;Therapeutic activities;Therapeutic exercise;Balance training;Neuromuscular re-education;Manual techniques;Passive range of motion;Patient/family education;Vasopneumatic Device;Taping;Dry needling    PT Next Visit Plan nustep, bike, pain free strengthening, pain free ROM,  modalities PRN for pain relief, kinesiotaping for patella tracking    PT Home Exercise Plan see patient education section    Consulted and Agree with Plan of Care Patient           Patient will benefit from skilled therapeutic intervention in order to improve the following deficits and impairments:  Decreased activity tolerance,Decreased balance,Abnormal gait,Difficulty walking,Decreased range of motion,Pain,Decreased strength,Increased edema,Decreased mobility  Visit Diagnosis: Chronic pain of right knee  Chronic pain of left knee  Muscle weakness (generalized)  Difficulty in walking, not elsewhere classified     Problem List Patient Active Problem List   Diagnosis Date Noted  . Panniculitis 09/30/2020  . Insect bite 06/26/2018  . Urticaria 01/24/2018  . Seasonal and perennial allergic rhinitis 01/24/2018  . Allergic conjunctivitis 01/24/2018  . Coughing 01/24/2018  . Fatigue 04/05/2016  . Hypersomnia with sleep apnea 04/05/2016  . Snoring 04/05/2016  . Hearing loss sensory, bilateral 04/05/2016  . Constipation 07/14/2015  . Abdominal pain 07/14/2015  . Generalized idiopathic epilepsy, not intractable, without status epilepticus ([place] 02/03/2015    KGabriela Eves PT, DPT 11/25/2020, 7:57 PM  [facility] [address] [address]Phone: [phone]  Fax:  [phone] Name: Mercedes ZULUETAMRN: [id number] of Birth: [birth year]

## 2020-11-27 ENCOUNTER — Other Ambulatory Visit: Payer: Self-pay

## 2020-11-27 ENCOUNTER — Ambulatory Visit: Payer: Medicaid Other | Admitting: Physical Therapy

## 2020-11-27 ENCOUNTER — Encounter: Payer: Self-pay | Admitting: Physical Therapy

## 2020-11-27 DIAGNOSIS — M25561 Pain in right knee: Secondary | ICD-10-CM | POA: Diagnosis not present

## 2020-11-27 DIAGNOSIS — M6281 Muscle weakness (generalized): Secondary | ICD-10-CM

## 2020-11-27 DIAGNOSIS — M25562 Pain in left knee: Secondary | ICD-10-CM

## 2020-11-27 DIAGNOSIS — G8929 Other chronic pain: Secondary | ICD-10-CM

## 2020-11-27 DIAGNOSIS — R262 Difficulty in walking, not elsewhere classified: Secondary | ICD-10-CM

## 2020-11-27 NOTE — Therapy (Signed)
[facility] [place], [place], [address] Phone: [phone]   Fax:  [phone]  Physical Therapy Treatment  Patient Details  Name: Mercedes Parks MRN: [id number] Date of Birth: [birth year] Referring Provider (PT): Frankey Shown, MD   Encounter Date: 11/27/2020   PT End of Session - 11/27/20 0823    Visit Number 8    Number of Visits 12    Date for PT Re-Evaluation 11/28/20    Authorization Type Medicaid; 11/10/2020-12/07/2020 Progress note every 10th visit     PT Start Time 0816    PT Stop Time 0904    PT Time Calculation (min) 48 min    Activity Tolerance Patient tolerated treatment well    Behavior During Therapy [facility] for tasks assessed/performed           Past Medical History:  Diagnosis Date  . 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 Seizure [facility])     Past Surgical History:  Procedure Laterality Date  . ABDOMINAL HYSTERECTOMY  2011  . CESAREAN SECTION     @ age 44  . CHOLECYSTECTOMY    . COCHLEAR IMPLANT     @ age 51  . GASTRIC BYPASS  07/05/2016  . PANNICULECTOMY  10/12/2017   Dr. Kellie Simmering  . TONSILECTOMY, ADENOIDECTOMY, BILATERAL MYRINGOTOMY AND TUBES      There were no vitals filed for this visit.   Subjective Assessment - 11/27/20 1728    Subjective COVID 19 screening performed on patient upon arrival. Patient arrives with reports of 4/10    Patient is accompained by: Interpreter   Clyde Canterbury   Pertinent History Epilepsy, chronic bilateral knee pain, hearing loss, cochlear implant    Limitations Standing;Writing    Diagnostic tests x-ray & CT: see imaging tab    Patient Stated Goals workout more    Currently in Pain? Yes    Pain Score 4     Pain Location Knee    Pain Orientation Right    Pain Descriptors / Indicators Tightness    Pain Type Chronic pain    Pain Onset More than a month ago    Pain Frequency Constant              [facility] PT Assessment - 11/27/20 0001      Assessment    Medical Diagnosis Chronic pain of right knee, Chronic pain of left knee    Referring Provider (PT) Frankey Shown, MD    Onset Date/Surgical Date 10/11/19    Hand Dominance Right      ROM / Strength   AROM / PROM / Strength AROM;Strength      AROM   Overall AROM  Within functional limits for tasks performed    AROM Assessment Site Knee    Right/Left Knee Right    Right Knee Flexion 137      Strength   Overall Strength Deficits;Within functional limits for tasks performed    Strength Assessment Site Hip;Knee    Right/Left Hip Right;Left    Right Hip Flexion 4/5    Right Hip ABduction 4/5    Left Hip Flexion 4+/5    Left Hip ABduction 4+/5    Right/Left Knee Right;Left    Right Knee Flexion 4/5    Right Knee Extension 4/5    Left Knee Flexion 4+/5    Left Knee Extension 4+/5  [facility] PT Treatment/Exercise - 11/27/20 0001      Knee/Hip Exercises: Aerobic   Stationary Bike L5 x10 min      Knee/Hip Exercises: Machines for Strengthening   Cybex Leg Press 3.5 pl, seat 7 x20 reps      Knee/Hip Exercises: Standing   Forward Lunges Both;10 reps;2 seconds    Forward Lunges Limitations reverse lunge no weight    Wall Squat 2 sets;10 reps;3 seconds;Other (comment)    Wall Squat Limitations 10# kettle bell    Other Standing Knee Exercises curtsey lunge 2x10 each    Other Standing Knee Exercises B fire hydrant, donkey kick x20 reps each      Manual Therapy   Manual Therapy Myofascial release    Myofascial Release TPR/STW to B distal ITB to reduce tone and pain                    PT Short Term Goals - 10/30/20 4166      PT SHORT TERM GOAL #1   Title Patient will be independent with HEP.    Baseline no knowledge of HEP    Time 3    Period Weeks    Status Achieved      PT SHORT TERM GOAL #2   Title Patient will report a 10% decrease in bilateral knee pain to improve tolerance for daily activities    Baseline Pain at worst as  7/10    Time 3    Period Weeks    Status Partially Met   Less muscle tightness, strength increased per patient report 10/30/2020            PT Long Term Goals - 11/27/20 0903      PT LONG TERM GOAL #1   Title Patient will be independent with advanced HEP    Baseline no knowledge of HEP    Time 6    Period Weeks    Status Unable to assess      PT LONG TERM GOAL #2   Title Patient will demonstrate 120 degrees of right knee flexion AROM to improve ability to perform functional tasks    Baseline 102 R knee flexion AROM    Time 6    Period Weeks    Status Achieved      PT LONG TERM GOAL #3   Title Patient will demonstrate 4+/5 or greater bilateral LE MMT to improve stability during functional tasks.    Baseline 3/5-4-/5 thorughout bilateral LEs    Time 6    Period Weeks    Status Partially Met      PT LONG TERM GOAL #4   Title Patient will report ability to perform reciprociating step pattern with use of railing to safely access areas of mom's home.    Baseline step to step pattern.    Time 6    Period Weeks    Status Achieved      PT LONG TERM GOAL #5   Title Patient will report ability to perform ADLs and home activities with bilateral knee pain less than or equal to 3/10.    Baseline at worst as 7/10 bilaterally    Time 6    Period Weeks    Status Achieved                 Plan - 11/27/20 1729    Clinical Impression Statement Patient presented in clinic with reports of continued B ITB tightness causing knee pain especially R knee.  Patient able to tolerate therex well although hip weakness evident. Greatest weakness in R hip as trendelenberg stance noted with lunges. Patient reports being advised of R hip weakness in the past by PTs. Patient able to progress fairly well with all goals and remain diligent with HEP as well as going to the gym. Continued TPs and sensitivity noted with manual therapy to B ITB.    Personal Factors and Comorbidities Comorbidity 2;Time  since onset of injury/illness/exacerbation;Age    Comorbidities Epilepsy, chronic bilateral knee pain, hearing loss, cochlear implant    Examination-Activity Limitations Locomotion Level;Transfers;Stairs;Stand;Squat    Stability/Clinical Decision Making Stable/Uncomplicated    Rehab Potential Fair    PT Frequency 2x / week    PT Duration 6 weeks    PT Treatment/Interventions ADLs/Self Care Home Management;Cryotherapy;Electrical Stimulation;Iontophoresis 20m/ml Dexamethasone;Moist Heat;Gait training;Stair training;Functional mobility training;Therapeutic activities;Therapeutic exercise;Balance training;Neuromuscular re-education;Manual techniques;Passive range of motion;Patient/family education;Vasopneumatic Device;Taping;Dry needling    PT Next Visit Plan nustep, bike, pain free strengthening, pain free ROM,  modalities PRN for pain relief, kinesiotaping for patella tracking    PT Home Exercise Plan see patient education section    Consulted and Agree with Plan of Care Patient           Patient will benefit from skilled therapeutic intervention in order to improve the following deficits and impairments:  Decreased activity tolerance,Decreased balance,Abnormal gait,Difficulty walking,Decreased range of motion,Pain,Decreased strength,Increased edema,Decreased mobility  Visit Diagnosis: Chronic pain of right knee  Chronic pain of left knee  Muscle weakness (generalized)  Difficulty in walking, not elsewhere classified     Problem List Patient Active Problem List   Diagnosis Date Noted  . Panniculitis 09/30/2020  . Insect bite 06/26/2018  . Urticaria 01/24/2018  . Seasonal and perennial allergic rhinitis 01/24/2018  . Allergic conjunctivitis 01/24/2018  . Coughing 01/24/2018  . Fatigue 04/05/2016  . Hypersomnia with sleep apnea 04/05/2016  . Snoring 04/05/2016  . Hearing loss sensory, bilateral 04/05/2016  . Constipation 07/14/2015  . Abdominal pain 07/14/2015  . Generalized  idiopathic epilepsy, not intractable, without status epilepticus ([place] 02/03/2015    KStandley Brooking PTA 11/27/2020, 5:38 PM  [facility] [address] [address]Phone: [phone]  Fax:  [phone] Name: HPETRITA BLUNCKMRN: [id number] of Birth: [birth year]

## 2020-12-09 ENCOUNTER — Encounter: Payer: Self-pay | Admitting: Physical Therapy

## 2020-12-10 ENCOUNTER — Other Ambulatory Visit: Payer: Self-pay | Admitting: Orthopaedic Surgery

## 2020-12-10 DIAGNOSIS — G8929 Other chronic pain: Secondary | ICD-10-CM

## 2020-12-10 DIAGNOSIS — M25562 Pain in left knee: Secondary | ICD-10-CM

## 2020-12-12 ENCOUNTER — Encounter: Payer: Self-pay | Admitting: Orthopaedic Surgery

## 2020-12-17 ENCOUNTER — Other Ambulatory Visit: Payer: Self-pay

## 2020-12-17 ENCOUNTER — Encounter: Payer: Self-pay | Admitting: Allergy & Immunology

## 2020-12-17 ENCOUNTER — Ambulatory Visit (INDEPENDENT_AMBULATORY_CARE_PROVIDER_SITE_OTHER): Payer: Medicaid Other | Admitting: Allergy & Immunology

## 2020-12-17 VITALS — BP 110/68 | HR 80 | Temp 98.0°F | Resp 18 | Ht 67.0 in | Wt 196.8 lb

## 2020-12-17 DIAGNOSIS — T781XXA Other adverse food reactions, not elsewhere classified, initial encounter: Secondary | ICD-10-CM

## 2020-12-17 DIAGNOSIS — J3089 Other allergic rhinitis: Secondary | ICD-10-CM | POA: Diagnosis not present

## 2020-12-17 DIAGNOSIS — J302 Other seasonal allergic rhinitis: Secondary | ICD-10-CM

## 2020-12-17 DIAGNOSIS — K219 Gastro-esophageal reflux disease without esophagitis: Secondary | ICD-10-CM | POA: Diagnosis not present

## 2020-12-17 DIAGNOSIS — K9049 Malabsorption due to intolerance, not elsewhere classified: Secondary | ICD-10-CM | POA: Diagnosis not present

## 2020-12-17 MED ORDER — FAMOTIDINE 40 MG PO TABS: 40.0000 mg | ORAL_TABLET | Freq: Two times a day (BID) | ORAL | 3 refills | Status: DC

## 2020-12-17 NOTE — Progress Notes (Signed)
NEW PATIENT  Date of Service/Encounter:  12/17/20  Referring provider: Denny Levy, PA   Assessment:   Food intolerance - with negative testing to the selected foods today  Gastroesophageal reflux disease - not well controlled  Perennial seasonal allergic rhinitis (mold, cockroaches) - tested within the last two years per the patient  S/p gastric bypass four years ago  Plan/Recommendations:   1. Food intolerance - Testing was negative negative to everything tested. - Copy of testing results provided. - There is a the low positive predictive value of food allergy testing and hence the high possibility of false positives. - In contrast, food allergy testing has a high negative predictive value, therefore if testing is negative we can be relatively assured that they are indeed negative.  - This could be an intolerance rather than an allergy. - There is no need for an EpiPen. - I am going to get some blood work to rule out something called alpha gal syndrome (red meat allergy). - I do not think that this is likely, but I want to make sure.   2. Gastroesophageal reflux disease - Continue with pantoprazole.  - Add on famotidine 40 mg twice daily to see if this helps at all.  3. Return in about 3 months (around 03/19/2021).   Subjective:   Mercedes Parks is a 40 y.o. female presenting today for evaluation of  Chief Complaint  Patient presents with  . Allergy Testing    Mercedes Parks has a history of the following: Patient Active Problem List   Diagnosis Date Noted  . Panniculitis 09/30/2020  . Insect bite 06/26/2018  . Urticaria 01/24/2018  . Seasonal and perennial allergic rhinitis 01/24/2018  . Allergic conjunctivitis 01/24/2018  . Coughing 01/24/2018  . Fatigue 04/05/2016  . Hypersomnia with sleep apnea 04/05/2016  . Snoring 04/05/2016  . Hearing loss sensory, bilateral 04/05/2016  . Constipation 07/14/2015  . Abdominal pain 07/14/2015  .  Generalized idiopathic epilepsy, not intractable, without status epilepticus ([place]) 02/03/2015    History obtained from: chart review and patient. She is accompanied by her son and her partner Mercedes Parks.  She also has a sign language interpreter present.  Mercedes Parks was referred by Denny Levy, PA.     [name] is a 40 y.o. female presenting for an evaluation of possible food allergies.  She has gastric bypass four years ago and she has noticed a few things have changed since that time. She cannot eat anything that is spicy. She has heart burn. She also has issues with beef and dairy. These both cause some stomach upset. She has to have diarrhea especially with beef. Cow's milk results in stomach pain and intermittent diarrhea. She has no hives or throat swelling.  She denies any tick bites.  She takes pantoprazole.  She is very good about remembering to take this.  She has never been on famotidine as well.  She does not eat much seafood aside from shrimp.  She otherwise tolerates all major food allergens without adverse event.  Allergic Rhinitis Symptom History: She knows that she is allergic to mold and roaches. She has tested a few years ago in [place]. She soes not take anything on a daily basis for this. She does have antibiotics for isnus infections. She was on allergy medication as needed.   Otherwise, there is no history of other atopic diseases, including asthma, drug allergies, stinging insect allergies, eczema, urticaria or contact dermatitis. There is no significant infectious history. Vaccinations  are up to date.    Past Medical History: Patient Active Problem List   Diagnosis Date Noted  . Panniculitis 09/30/2020  . Insect bite 06/26/2018  . Urticaria 01/24/2018  . Seasonal and perennial allergic rhinitis 01/24/2018  . Allergic conjunctivitis 01/24/2018  . Coughing 01/24/2018  . Fatigue 04/05/2016  . Hypersomnia with sleep apnea 04/05/2016  . Snoring 04/05/2016   . Hearing loss sensory, bilateral 04/05/2016  . Constipation 07/14/2015  . Abdominal pain 07/14/2015  . Generalized idiopathic epilepsy, not intractable, without status epilepticus ([place]) 02/03/2015    Medication List:  Allergies as of 12/17/2020      Reactions   Amoxicillin Hives   Hydrocodone Nausea And Vomiting   Nsaids    Gastric Bypass Surgery   Penicillins Hives   Has patient had a PCN reaction causing immediate rash, facial/tongue/throat swelling, SOB or lightheadedness with hypotension: Yes Has patient had a PCN reaction causing severe rash involving mucus membranes or skin necrosis: No Has patient had a PCN reaction that required hospitalization No Has patient had a PCN reaction occurring within the last 10 years: Non If all of the above answers are "NO", then may proceed with Cephalosporin use.   Sulfa Antibiotics Hives      Medication List       Accurate as of December 17, 2020  1:27 PM. If you have any questions, ask your nurse or doctor.        EQUATE PO Take by mouth.   escitalopram 10 MG tablet Commonly known as: LEXAPRO Take 10 mg by mouth daily.   estradiol 2 MG tablet Commonly known as: ESTRACE Take 2 mg by mouth daily.   famotidine 40 MG tablet Commonly known as: PEPCID Take 1 tablet (40 mg total) by mouth 2 (two) times daily. Started by: Valentina Shaggy, MD   levETIRAcetam 1000 MG tablet Commonly known as: KEPPRA Take 1 tablet (1,000 mg total) by mouth 2 (two) times daily.   OPURITY PO Take by mouth.   pantoprazole 40 MG tablet Commonly known as: PROTONIX   phentermine 37.5 MG tablet Commonly known as: ADIPEX-P Take 37.5 mg by mouth at bedtime.       Birth History: non-contributory.  She was diagnosed with hearing loss around the age of 1.  She attended a [employer] where she went to the spine starting at age 21 or 57.  Developmental History: non-contributory  Past Surgical History: Past Surgical History:   Procedure Laterality Date  . ABDOMINAL HYSTERECTOMY  2011  . CESAREAN SECTION     @ age 57  . CHOLECYSTECTOMY    . COCHLEAR IMPLANT     @ age 8  . GASTRIC BYPASS  07/05/2016  . PANNICULECTOMY  10/12/2017   Dr. Kellie Simmering  . TONSILECTOMY, ADENOIDECTOMY, BILATERAL MYRINGOTOMY AND TUBES       Family History: Family History  Problem Relation Age of Onset  . Healthy Mother   . Healthy Father   . Colon cancer Maternal Grandfather   . Breast cancer Paternal Grandmother   . Other Paternal Grandmother        Myles Lipps disease  . Seizures Neg Hx      Social History: Wilmary lives at home with her son and partner Mercedes Parks.  They live in an apartment that was built in 1979.  There would first get the home.  There is water damage in the home.  They have gas heating and central cooling.  There is a dog  inside of the home.  She does not have dust mite covers on the bedding.  There is no tobacco exposure.   Review of Systems  Constitutional: Negative.  Negative for fever, malaise/fatigue and weight loss.  HENT: Negative.  Negative for congestion, ear discharge and ear pain.   Eyes: Negative for pain, discharge and redness.  Respiratory: Negative for cough, sputum production, shortness of breath and wheezing.   Cardiovascular: Negative.  Negative for chest pain and palpitations.  Gastrointestinal: Negative for abdominal pain, heartburn, nausea and vomiting.       Positive for reflux.  Skin: Negative.  Negative for itching and rash.  Neurological: Negative for dizziness and headaches.  Endo/Heme/Allergies: Positive for environmental allergies. Does not bruise/bleed easily.       Positive for possible food allergies.       Objective:   Blood pressure 110/68, pulse 80, temperature 98 F (36.7 C), temperature source Temporal, resp. rate 18, height 5\' 7"  (1.702 m), weight 196 lb 12.8 oz (89.3 kg), SpO2 99 %. Body mass index is 30.82 kg/m.   Physical Exam:   Physical  Exam Constitutional:      Appearance: She is well-developed.     Comments: Pleasant female.  HENT:     Head: Normocephalic and atraumatic.     Right Ear: Tympanic membrane, ear canal and external ear normal.     Left Ear: Tympanic membrane, ear canal and external ear normal.     Nose: No nasal deformity, septal deviation, mucosal edema or rhinorrhea.     Right Turbinates: Enlarged and swollen.     Left Turbinates: Enlarged and swollen.     Right Sinus: No maxillary sinus tenderness or frontal sinus tenderness.     Left Sinus: No maxillary sinus tenderness or frontal sinus tenderness.     Mouth/Throat:     Mouth: Mucous membranes are not pale and not dry.     Pharynx: Uvula midline.  Eyes:     General:        Right eye: No discharge.        Left eye: No discharge.     Conjunctiva/sclera: Conjunctivae normal.     Right eye: Right conjunctiva is not injected. No chemosis.    Left eye: Left conjunctiva is not injected. No chemosis.    Pupils: Pupils are equal, round, and reactive to light.  Cardiovascular:     Rate and Rhythm: Normal rate and regular rhythm.     Heart sounds: Normal heart sounds.  Pulmonary:     Effort: Pulmonary effort is normal. No tachypnea, accessory muscle usage or respiratory distress.     Breath sounds: Normal breath sounds. No wheezing, rhonchi or rales.  Chest:     Chest wall: No tenderness.  Lymphadenopathy:     Cervical: No cervical adenopathy.  Skin:    General: Skin is warm.     Capillary Refill: Capillary refill takes less than 2 seconds.     Coloration: Skin is not pale.     Findings: No abrasion, erythema, petechiae or rash. Rash is not papular, urticarial or vesicular.     Comments: Multiple tattoos.  Neurological:     Mental Status: She is alert.  Psychiatric:        Behavior: Behavior is cooperative.      Diagnostic studies:   Allergy Studies:     Food Adult Perc - 12/17/20 0900    Time Antigen Placed [place]    Location  Back    Number of allergen test 11     Control-buffer 50% Glycerol Negative    Control-Histamine 1 mg/ml 3+    3. Wheat Negative    5. Milk, cow Negative    7. Casein Negative    30. Barley Negative    31. Oat  Negative    32. Rye  Negative    37. Pork Negative    38. [place] Meat Negative    40. Beef Negative           Allergy testing results were read and interpreted by myself, documented by clinical staff.         Salvatore Marvel, MD [facility]

## 2020-12-17 NOTE — Patient Instructions (Addendum)
1. Food intolerance - Testing was negative negative to everything tested. - Copy of testing results provided. - There is a the low positive predictive value of food allergy testing and hence the high possibility of false positives. - In contrast, food allergy testing has a high negative predictive value, therefore if testing is negative we can be relatively assured that they are indeed negative.  - This could be an intolerance rather than an allergy. - There is no need for an EpiPen. - I am going to get some blood work to rule out something called alpha gal syndrome (red meat allergy). - I do not think that this is likely, but I want to make sure.   2. Gastroesophageal reflux disease - Continue with pantoprazole.  - Add on famotidine 40 mg twice daily to see if this helps at all.  3. Return in about 3 months (around 03/19/2021).    Please inform us of any Emergency Department visits, hospitalizations, or changes in symptoms. Call us before going to the ED for breathing or allergy symptoms since we might be able to fit you in for a sick visit. Feel free to contact us anytime with any questions, problems, or concerns.  It was a pleasure to meet you and your family today!  Websites that have reliable patient information: 1. American Academy of Asthma, Allergy, and Immunology: www.aaaai.org 2. Food Allergy Research and Education (FARE): foodallergy.org 3. Mothers of Asthmatics: http://www.asthmacommunitynetwork.org 4. American College of Allergy, Asthma, and Immunology: www.acaai.org   COVID-19 Vaccine Information can be found at: [email or web address].[email or web address].uk For questions related to vaccine distribution or appointments, please email [email or web address] or call [phone].   We realize that you might be concerned about having an allergic reaction to the COVID19 vaccines. To help with that concern, WE ARE OFFERING THE COVID19 VACCINES IN  OUR OFFICE! Ask the front desk for dates!     "Like" [place] on Facebook and Instagram for our latest updates!      A healthy democracy works best when [employer] participate! Make sure you are registered to vote! If you have moved or changed any of your contact information, you will need to get this updated before voting!  In some cases, you MAY be able to register to vote online: [email or web address]    Food Adult Perc - 12/17/20 0900    Time Antigen Placed 0932    Allergen Manufacturer Lavella [facility]    Location Back    Number of allergen test 11     Control-buffer 50% Glycerol Negative    Control-Histamine 1 mg/ml 3+    3. Wheat Negative    5. Milk, cow Negative    7. Casein Negative    30. Barley Negative    31. Oat  Negative    32. Rye  Negative    37. Pork Negative    38. [place] Meat Negative    40. Beef Negative     Food Intolerance Versus Food Allergy  Food IntoleranceSome of the symptoms of food intolerance and food allergy are similar, but the differences between the two are very important. Eating a food you are intolerant to can leave you feeling miserable. However, if you have a true food allergy, your body's reaction to this food could be life-threatening.  Digestive system versus immune system  A food intolerance response takes place in the digestive system. It occurs when you are unable to properly breakdown the food. This could be due to enzyme deficiencies, sensitivity to food additives or reactions to  naturally occurring chemicals in foods. Often, people can eat small amounts of the food without causing problems.  A food allergic reaction involves the immune system. Your immune system controls how your body defends itself. For instance, if you have an allergy to cow's milk, your immune system identifies cow's milk as an invader or allergen. Your immune system overreacts by producing antibodies called Immunoglobulin E (IgE). These antibodies  travel to cells that release chemicals, causing an allergic reaction. Each type of IgE has a specific "radar" for each type of allergen.  Unlike an intolerance to food, a food allergy can cause a serious or even life-threatening reaction by eating a microscopic amount, touching or inhaling the food.  Symptoms of allergic reactions to foods are generally seen on the skin (hives, itchiness, swelling of the skin). Gastrointestinal symptoms may include vomiting and diarrhea. Respiratory symptoms may accompany skin and gastrointestinal symptoms, but don't usually occur alone.  Anaphylaxis (pronounced an-a-fi-LAK-sis) is a serious allergic reaction that happens very quickly. Symptoms of anaphylaxis may include difficulty breathing, dizziness or loss of consciousness. Without immediate treatment--an injection of epinephrine (adrenalin) and expert care--anaphylaxis can be fatal.  To the Point: there is a very serious difference between being intolerant to a food and having a food allergy.

## 2020-12-22 LAB — ALPHA-GAL PANEL
Allergen Lamb IgE: <0.1 kU/L
Beef IgE: <0.1 kU/L
IgE (Immunoglobulin E), Serum: 9 IU/mL (ref 6–495)
O215-IgE Alpha-Gal: <0.1 kU/L
Pork IgE: <0.1 kU/L

## 2020-12-25 ENCOUNTER — Encounter: Payer: Self-pay | Admitting: Allergy & Immunology

## 2020-12-26 HISTORY — PX: COCHLEAR IMPLANT: SUR684

## 2021-02-06 ENCOUNTER — Encounter: Payer: Self-pay | Admitting: Orthopaedic Surgery

## 2021-03-02 ENCOUNTER — Encounter: Payer: Self-pay | Admitting: Physical Therapy

## 2021-03-02 ENCOUNTER — Ambulatory Visit: Payer: Medicaid Other | Attending: Orthopaedic Surgery | Admitting: Physical Therapy

## 2021-03-02 ENCOUNTER — Other Ambulatory Visit: Payer: Self-pay

## 2021-03-02 DIAGNOSIS — M25551 Pain in right hip: Secondary | ICD-10-CM | POA: Insufficient documentation

## 2021-03-02 DIAGNOSIS — G8929 Other chronic pain: Secondary | ICD-10-CM | POA: Diagnosis present

## 2021-03-02 DIAGNOSIS — R262 Difficulty in walking, not elsewhere classified: Secondary | ICD-10-CM | POA: Insufficient documentation

## 2021-03-02 DIAGNOSIS — M25562 Pain in left knee: Secondary | ICD-10-CM | POA: Diagnosis present

## 2021-03-02 DIAGNOSIS — M25651 Stiffness of right hip, not elsewhere classified: Secondary | ICD-10-CM | POA: Diagnosis present

## 2021-03-02 DIAGNOSIS — M25561 Pain in right knee: Secondary | ICD-10-CM | POA: Insufficient documentation

## 2021-03-02 DIAGNOSIS — M6281 Muscle weakness (generalized): Secondary | ICD-10-CM | POA: Diagnosis present

## 2021-03-02 DIAGNOSIS — M25552 Pain in left hip: Secondary | ICD-10-CM | POA: Diagnosis present

## 2021-03-02 NOTE — Patient Instructions (Addendum)
   Trigger Point Dry Needling  . What is Trigger Point Dry Needling (DN)? o DN is a physical therapy technique used to treat muscle pain and dysfunction. Specifically, DN helps deactivate muscle trigger points (muscle knots).  o A thin filiform needle is used to penetrate the skin and stimulate the underlying trigger point. The goal is for a local twitch response (LTR) to occur and for the trigger point to relax. No medication of any kind is injected during the procedure.   . What Does Trigger Point Dry Needling Feel Like?  o The procedure feels different for each individual patient. Some patients report that they do not actually feel the needle enter the skin and overall the process is not painful. Very mild bleeding may occur. However, many patients feel a deep cramping in the muscle in which the needle was inserted. This is the local twitch response.   Marland Kitchen How Will I feel after the treatment? o Soreness is normal, and the onset of soreness may not occur for a few hours. Typically this soreness does not last longer than two days.  o Bruising is uncommon, however; ice can be used to decrease any possible bruising.  o In rare cases feeling tired or nauseous after the treatment is normal. In addition, your symptoms may get worse before they get better, this period will typically not last longer than 24 hours.   . What Can I do After My Treatment? o Increase your hydration by drinking more water for the next 24 hours. o You may place ice or heat on the areas treated that have become sore, however, do not use heat on inflamed or bruised areas. Heat often brings more relief post needling. o You can continue your regular activities, but vigorous activity is not recommended initially after the treatment for 24 hours. o DN is best combined with other physical therapy such as strengthening, stretching, and other therapies.   Voncille Lo, PT, [place] Certified Exercise Expert for the Aging Adult   03/02/21 2:51 PM Phone: [phone] Fax: [phone]

## 2021-03-02 NOTE — Therapy (Signed)
[place], [place], [address] Phone: [phone]   Fax:  [phone]  Physical Therapy Evaluation  Patient Details  Name: Mercedes Parks MRN: [id number] Date of Birth: [birth year] Referring Provider (PT): Frankey Shown, MD   Encounter Date: 03/02/2021   PT End of Session - 03/02/21 1615    Visit Number 1    Number of Visits 12    Date for PT Re-Evaluation 04/13/21    Authorization Type Medicaid [place] 3 visits intially before reval 03-23-21 for additional visit approved    PT Start Time 0215    PT Stop Time 0310    PT Time Calculation (min) 55 min    Activity Tolerance Patient tolerated treatment well    Behavior During Therapy [facility] for tasks assessed/performed           Past Medical History:  Diagnosis Date  . 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 Seizure [facility])     Past Surgical History:  Procedure Laterality Date  . ABDOMINAL HYSTERECTOMY  2011  . CESAREAN SECTION     @ age 86  . CHOLECYSTECTOMY    . COCHLEAR IMPLANT     @ age 55  . GASTRIC BYPASS  07/05/2016  . PANNICULECTOMY  10/12/2017   Dr. Kellie Simmering  . TONSILECTOMY, ADENOIDECTOMY, BILATERAL MYRINGOTOMY AND TUBES      There were no vitals filed for this visit.    Subjective Assessment - 03/02/21 1418    Subjective I was having pain in knees.  I was seeing a PT in [place] for a while and my knees did get better, but I still have knots in my legs that hurt. I cant sleep because I cant sleep on both sides because My trigger points hurt and keep me up at night.   I can only slelep 2 -3 hours    Patient is accompained by: Interpreter    Pertinent History Epilepsy, chronic bilateral knee pain, hearing loss, cochlear implant    Limitations Standing;Other (comment)   sleep   How long can you sit comfortably? after 45 minutes in car    How long can you stand comfortably? stand 15-20 minutes    How long can you walk comfortably? I do  exercises  and I am involved in [place] by walking about 5 minutes  My hands    Diagnostic tests x-ray & CT: see imaging tab    Patient Stated Goals reduce pain in the muscle and sleep through the night    Currently in Pain? Yes    Pain Score 0-No pain    Pain Location Knee    Pain Orientation Right;Left    Multiple Pain Sites Yes    Pain Score 5   sleep at night 9 to 10/10 when lying on it   Pain Location Hip    Pain Orientation Right;Left   R> L   Pain Descriptors / Indicators Tightness;Aching;Sore    Pain Type Chronic pain    Pain Onset More than a month ago    Pain Frequency Intermittent    Aggravating Factors  sleeping standing for more than 15 minutes,  sitting in car              [facility] PT Assessment - 03/02/21 0001      Assessment   Medical Diagnosis Chronic pain of right knee, Chronic pain of left knee    Referring Provider (PT) Frankey Shown, MD  Onset Date/Surgical Date 10/11/19    Hand Dominance Right    Prior Therapy yes in [place] , [place]      Precautions   Precautions None      Restrictions   Weight Bearing Restrictions No      Balance Screen   Has the patient fallen in the past 6 months No    Has the patient had a decrease in activity level because of a fear of falling?  No    Is the patient reluctant to leave their home because of a fear of falling?  No      Home Ecologist residence    Living Arrangements Spouse/significant other;Children    Type of Home Apartment    Home Access Stairs to enter    Entrance Stairs-Number of Steps 4    Entrance Stairs-Rails Can reach both      Cognition   Overall Cognitive Status Within Functional Limits for tasks assessed      Observation/Other Assessments   Focus on Therapeutic Outcomes (FOTO)  no FOTO  MCD      ROM / Strength   AROM / PROM / Strength AROM;Strength      AROM   Overall AROM  Within functional limits for tasks performed    AROM Assessment Site Knee    Right  Hip Flexion 114    Right Hip External Rotation  23   tight   Right Hip Internal Rotation  45    Left Hip Flexion 120    Left Hip External Rotation  36    Left Hip Internal Rotation  47    Right/Left Knee Right;Left    Right Knee Extension 0    Right Knee Flexion 140    Left Knee Extension 0    Left Knee Flexion 133      Strength   Overall Strength Deficits;Within functional limits for tasks performed    Strength Assessment Site Hip;Knee    Right/Left Hip Right;Left    Right Hip Flexion 4/5    Right Hip Extension 4-/5    Right Hip External Rotation  4-/5    Right Hip Internal Rotation 4/5    Right Hip ABduction 4-/5    Left Hip Flexion 4+/5    Left Hip Extension 4+/5    Left Hip External Rotation 4+/5    Left Hip Internal Rotation 4+/5    Left Hip ABduction 4+/5    Right Knee Flexion 4+/5    Right Knee Extension 4+/5    Left Knee Flexion 5/5    Left Knee Extension 5/5      Flexibility   Hamstrings slight tightness on R    ITB tight ITB R>L      Palpation   Palpation comment Pt with minimal tenderness around bil knees. but marked tenderness along R piriformis and IT band on R with stiffness; some TTP on Left hip      Transfers   Transfers Independent with all Transfers      Ambulation/Gait   Gait Pattern Step-through pattern;Decreased stride length;Decreased stance time - right;Decreased step length - left                      Objective measurements completed on examination: See above findings.         Trigger Point Dry Needling - 03/02/21 0001    Consent Given? Yes    Education Handout Provided Previously provided    Muscles Treated Lower  Quadrant Quadriceps    Muscles Treated Back/Hip Piriformis    Dry Needling Comments 75 mm 30 gage    Quadriceps Response Palpable increased muscle length   vastus lateralis   Piriformis Response Twitch response elicited;Palpable increased muscle length                  PT Short Term Goals - 03/02/21  1612      PT SHORT TERM GOAL #1   Title Patient will be independent with HEP.    Baseline needs additional HEP to address hip weakness    Time 3    Period Weeks    Status New    Target Date 03/23/21      PT SHORT TERM GOAL #2   Title Pt will tolerated trial of TPDN in order to reduce R hip/knee pain by atleast 25%    Baseline Pain at night lying on side 9/10   presently in clinc 5/10    Time 3    Period Weeks    Status New    Target Date 03/23/21      PT SHORT TERM GOAL #3   Title Pt will be able to sleep at least 3 hours uninterrupted and be able to tolerate lying on side for at least 30 minutes    Baseline unable to sleep longer than 2-3 hours and cannot tolerate lying of sides due to R/L hip pain  ( R > L)    Time 3    Period Weeks    Status New    Target Date 03/23/21             PT Long Term Goals - 03/02/21 1617      PT LONG TERM GOAL #1   Title Patient will be independent with advanced HEP    Baseline Pt with knee HEP but needs to continue hip/core    Time 6    Period Weeks    Status New    Target Date 04/13/21      PT LONG TERM GOAL #2   Title Patient will demonstrate R ER of hip to at least 45 degrees    Baseline R ER 23 degrees with hip stiffness    Time 6    Period Weeks    Status New    Target Date 04/13/21      PT LONG TERM GOAL #3   Title Patient will demonstrate 4+/5 or greater bilateral LE MMT to improve stability during functional tasks.    Baseline 4-/5  R hip/ 4-/5 /4/5 in Left   knee R 4+/5  L 5/5    Time 6    Period Weeks    Status New    Target Date 04/13/21      PT LONG TERM GOAL #4   Title Pt will negotiate stairs with good hip stability with step over step technique    Baseline step to step pattern. Hip weakness on R    Time 6    Period Weeks    Status New    Target Date 04/13/21      PT LONG TERM GOAL #5   Title Pt will report ability to sleep greater than 4 hours uninterrupted without waking due to rolling on side at night     Baseline Pt unable to sleep on Side at all    Time 6    Period Weeks    Status New    Target Date 04/13/21  Access Code: [email or web address]: 06/06/2022Prepared by: Donnetta Simpers BeardsleyExercises  Goblet Squat with Kettlebell - 1 x daily - 7 x weekly - 3 sets - 10 reps  Clam with Resistance - 1 x daily - 7 x weekly - 3 sets - 10 reps  Sidelying Hip Abduction - 1 x daily - 7 x weekly - 3 sets - 10 reps       Plan - 03/02/21 1440    Clinical Impression Statement Ms Pita presents to clinic with deaf interpreter and greater than 6 month complaint of knee and now shooting R hip pain with some left soreness in hip as well.  Pt present to [facility] street clinic to try dry needling and to alleviate Sharp right hip / thigh pain in order to be able to sleep more than 2 hours at night and to sleep on sides without pain.  She has been to PT in [place] and her knee pain has greatly improved but she presents with R hip pain / weakness.  Pt unable to squat greater than 60 degrees hip flexion with wt shift to left to decrease pain in R hip.Pt has undergone gastric bypass with 100 lb weight loss and change in center of gravity.  Pt consents to TPDN during evaluation visit and has marked twitch response in R piriformis with decrease in sharpness of pain.   Pt will benefit from skilled PT to address bil hip weakness ( R> L) and to continue strengthening and address impairments in AROM, strength, gait and pain as indicated below.    Personal Factors and Comorbidities Comorbidity 2;Time since onset of injury/illness/exacerbation;Age    Comorbidities Epilepsy, chronic bilateral knee pain, hearing loss, cochlear implant, gastric bypass. ( loss of 100 lbs)    Examination-Activity Limitations Locomotion Level;Transfers;Stairs;Stand;Squat    Stability/Clinical Decision Making Evolving/Moderate complexity    Clinical Decision Making Moderate    Rehab Potential Good    PT  Frequency 2x / week   after intial 3 vsits to deternine need for additional visits up to 12   PT Duration 6 weeks    PT Treatment/Interventions ADLs/Self Care Home Management;Cryotherapy;Electrical Stimulation;Iontophoresis 4mg /ml Dexamethasone;Moist Heat;Gait training;Stair training;Functional mobility training;Therapeutic activities;Therapeutic exercise;Balance training;Neuromuscular re-education;Manual techniques;Passive range of motion;Patient/family education;Vasopneumatic Device;Taping;Dry needling    PT Next Visit Plan assess TPDN from eval. add piriformis to HEP and progress Hip strengthening    PT Home Exercise Plan [email or web address]    Consulted and Agree with Plan of Care Patient           Patient will benefit from skilled therapeutic intervention in order to improve the following deficits and impairments:  Decreased activity tolerance,Decreased balance,Abnormal gait,Difficulty walking,Decreased range of motion,Pain,Decreased strength,Increased edema,Decreased mobility  Visit Diagnosis: Chronic pain of right knee - Plan: PT plan of care cert/re-cert  Pain in right hip - Plan: PT plan of care cert/re-cert  Chronic pain of left knee - Plan: PT plan of care cert/re-cert  Muscle weakness (generalized) - Plan: PT plan of care cert/re-cert  Difficulty in walking, not elsewhere classified - Plan: PT plan of care cert/re-cert  Pain in left hip - Plan: PT plan of care cert/re-cert  Stiffness of right hip, not elsewhere classified - Plan: PT plan of care cert/re-cert     Problem List Patient Active Problem List   Diagnosis Date Noted  . Gastroesophageal reflux disease 12/17/2020  . Food intolerance 12/17/2020  . Panniculitis 09/30/2020  . Insect bite 06/26/2018  . Urticaria 01/24/2018  . Seasonal and perennial allergic rhinitis 01/24/2018  .  Allergic conjunctivitis 01/24/2018  . Coughing 01/24/2018  . Fatigue 04/05/2016  . Hypersomnia with sleep apnea 04/05/2016  . Snoring  04/05/2016  . Hearing loss sensory, bilateral 04/05/2016  . Constipation 07/14/2015  . Abdominal pain 07/14/2015  . Generalized idiopathic epilepsy, not intractable, without status epilepticus ([place]) 02/03/2015   Voncille Lo, PT, [place] Certified Exercise Expert for the Aging Adult  03/02/21 4:29 PM Phone: [phone] Fax: [facility] [address] [place], [place], [address] Phone: [phone]   Fax:  [phone]  Name: Mercedes Parks MRN: [id number] Date of Birth: August 27, 1981

## 2021-03-05 IMAGING — CT CT KNEE*R* W/CM
1 series · 12 of 14 positions shown, 15 images · non-contrast
Comparison: Plain films right knee 09/05/2020.

CLINICAL DATA: Right knee pain for 4 months.  No known injury.

EXAM:
CT ARTHROGRAPHY OF THE RIGHT KNEE
TECHNIQUE: Multidetector CT imaging was performed following the standard
protocol after injection of dilute contrast into the joint.

[Series 4: knee soft tissue · axial · 0.44mm/px · z∈[-315,-150]mm · 12 of 65 slices shown, 15 images]
[im 5/65  soft-tissue]
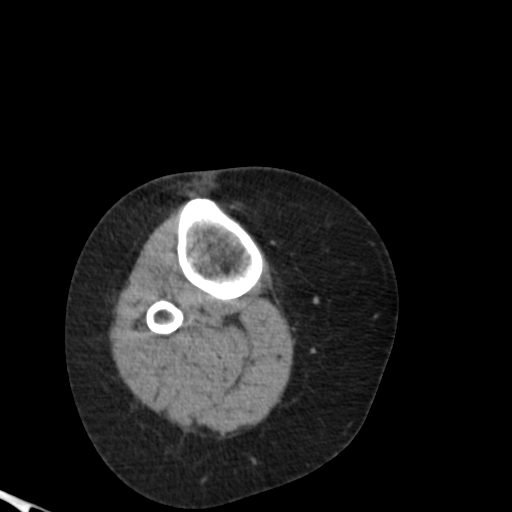
[im 5/65  bone]
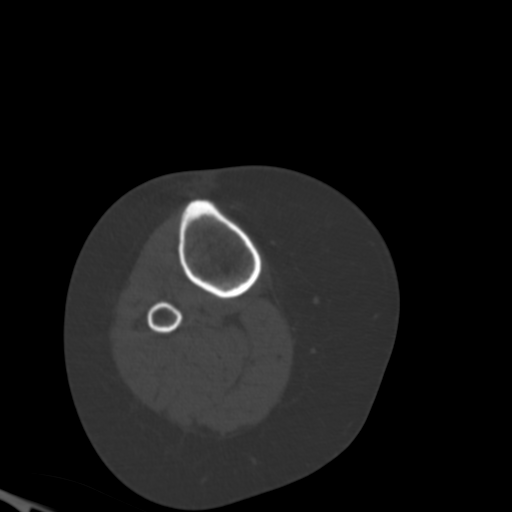
[im 10/65  bone]
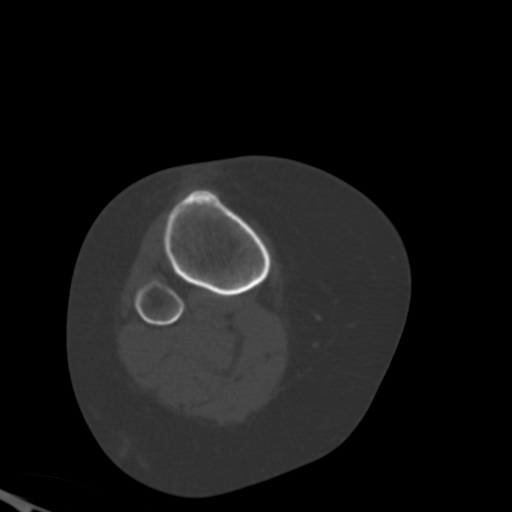
[im 15/65  bone]
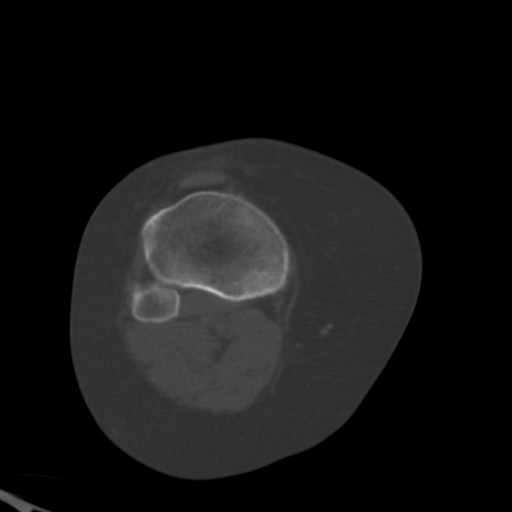
[im 20/65  bone]
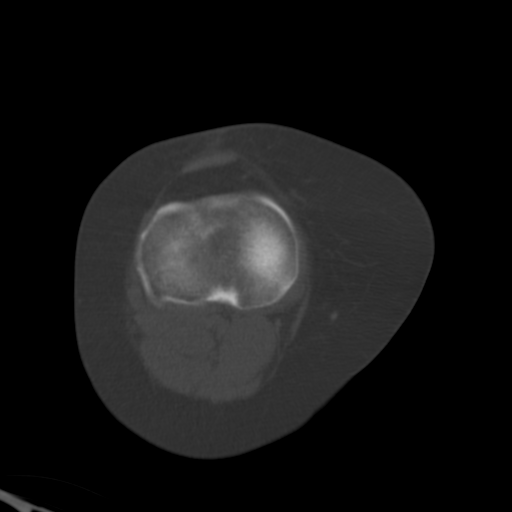
[im 25/65  soft-tissue]
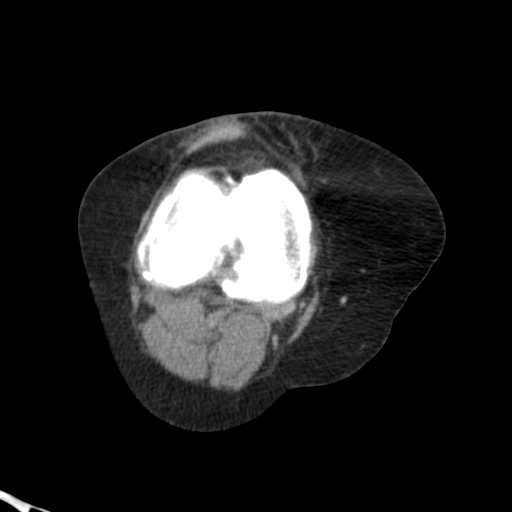
[im 25/65  bone]
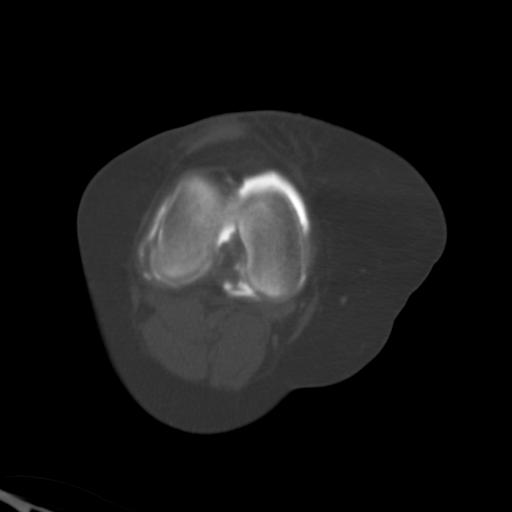
[im 30/65  bone]
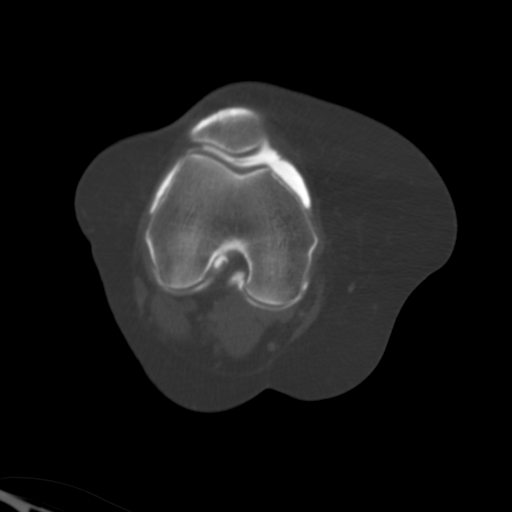
[im 35/65  bone]
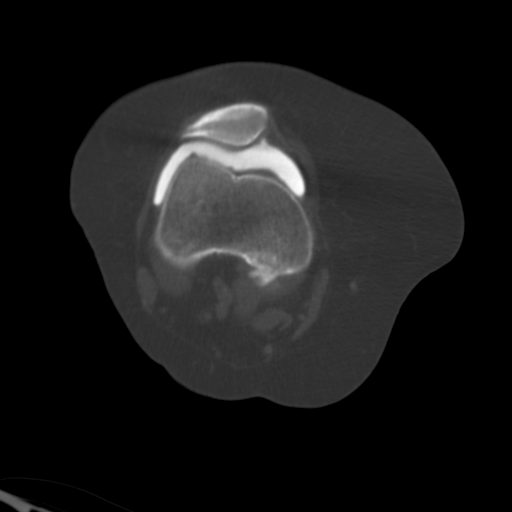
[im 40/65  bone]
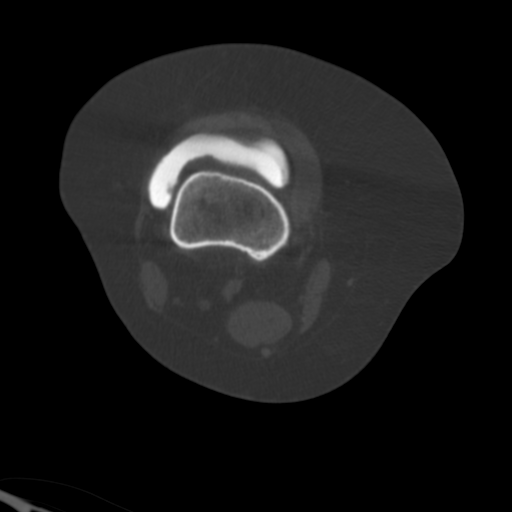
[im 45/65  soft-tissue]
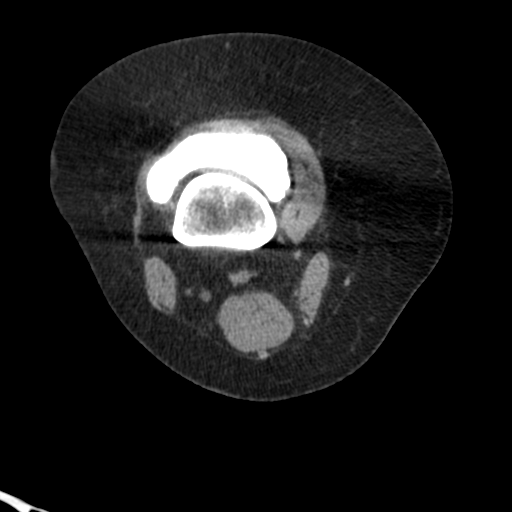
[im 45/65  bone]
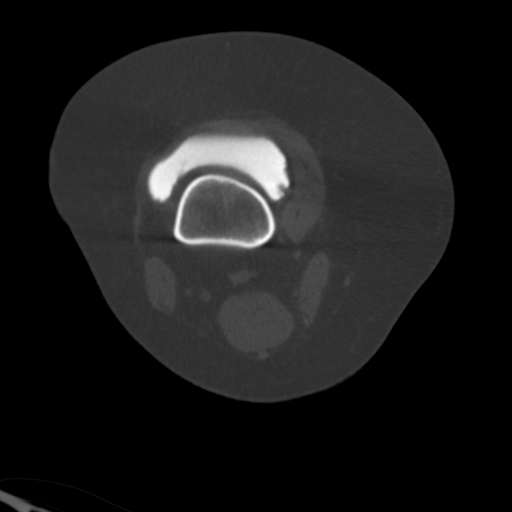
[im 50/65  bone]
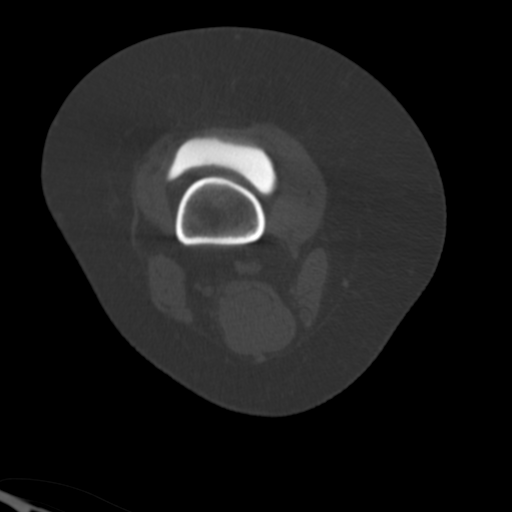
[im 55/65  bone]
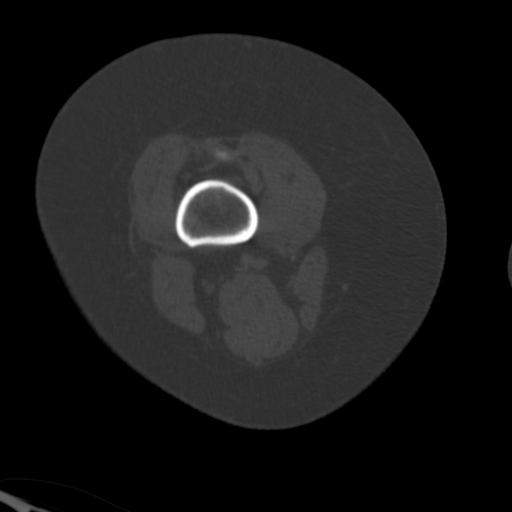
[im 60/65  bone]
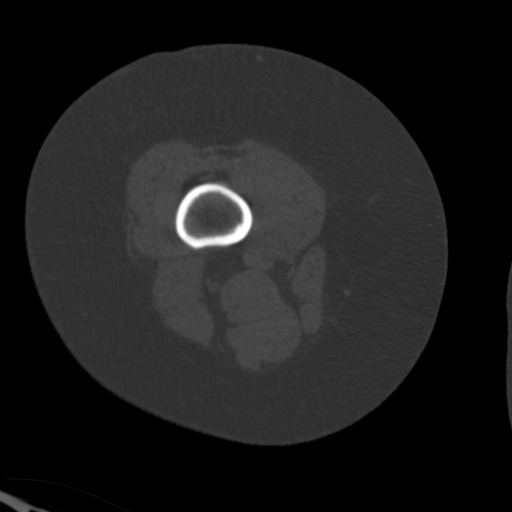

[12 of 14 positions shown; findings below may reference images not displayed]

FINDINGS: MENISCI

Medial meniscus:  Intact.

Lateral meniscus:  Intact.

LIGAMENTS

Cruciates: Visualization is somewhat limited on CT but no tear is
identified.

Collaterals:  Intact.

CARTILAGE

Patellofemoral:  Normal.

Medial:  Normal.

Lateral: A single small fissure is seen in hyaline cartilage along
the lateral tibial plateau.

Joint:  Distended with contrast.

Popliteal Fossa:  No Baker's cyst.

Extensor Mechanism:  Intact.

Bones:  Appear normal.

Other: None.
IMPRESSION: A single small fissure is seen in hyaline cartilage of the lateral
tibial plateau. Negative for meniscal or ligament tear. The exam is
otherwise unremarkable.

## 2021-03-05 IMAGING — XA DG FLUORO GUIDE NDL PLC/BX
2 series · 2 of 2 positions shown · non-contrast
Comparison: none

CLINICAL DATA: Bilateral knee pain.

[Series 2: ortho adipose · 1 of 1 slices shown (1 of 2)]
[im 1/1]
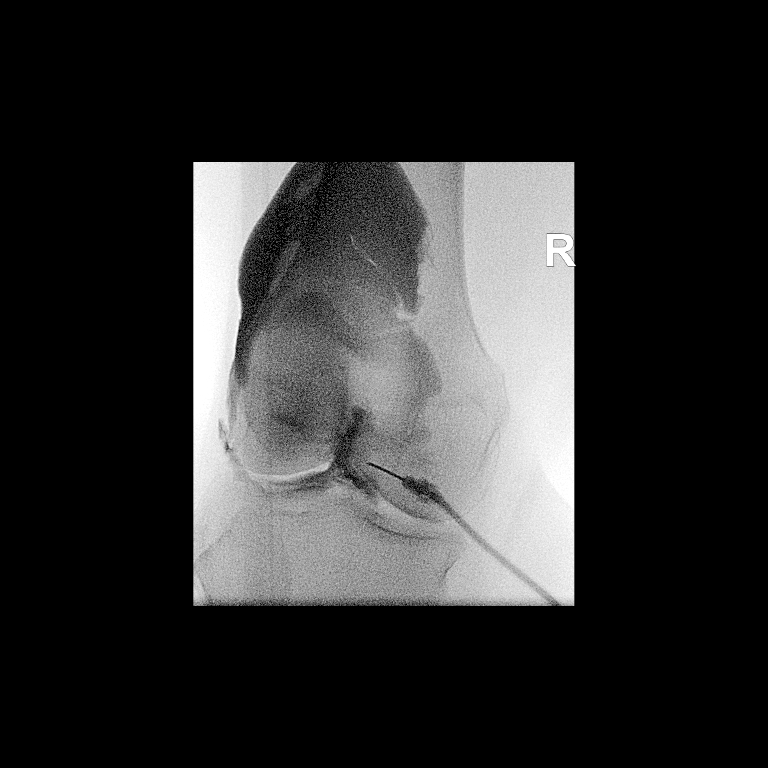

[Series 3: ortho adipose · 1 of 1 slices shown (2 of 2)]
[im 1/1]
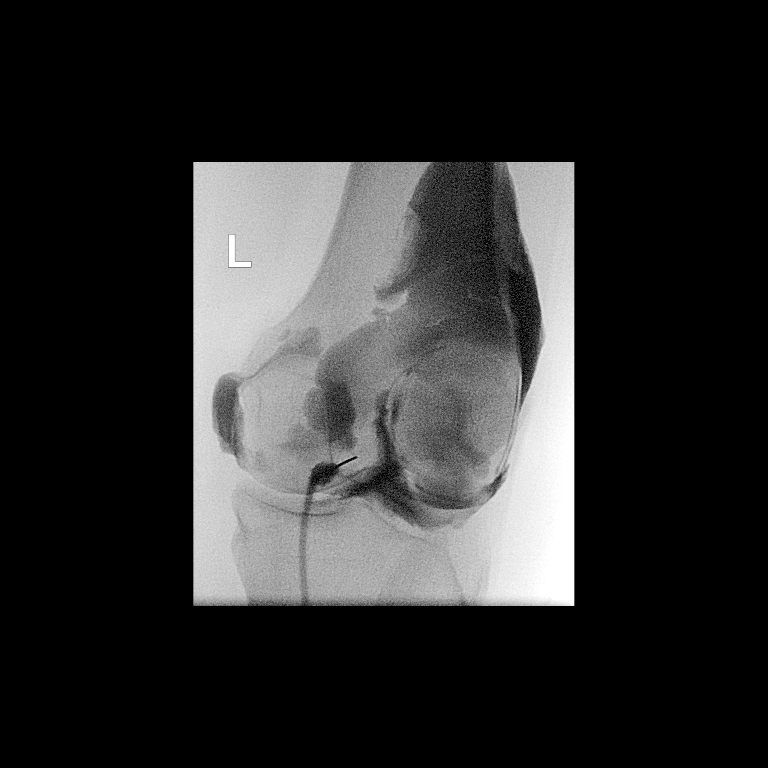

[2 of 2 positions shown; findings below may reference images not displayed]

FLUOROSCOPY TIME:  Radiation Exposure Index (as provided by the
fluoroscopic device): 0.7 mGy

Fluoroscopy Time:  29 seconds

Number of Acquired Images:  0

PROCEDURE:
The risks and benefits of the procedure were discussed with the
patient, and written informed consent was obtained. The patient
stated no history of allergy to contrast media. A formal timeout
procedure was performed with the patient according to departmental
protocol.

RIGHT KNEE:

The patient was placed supine on the fluoroscopy table and the right
knee joint was identified under fluoroscopy. The skin overlying the
right knee joint was subsequently cleaned with Betadine and a
sterile drape was placed over the area of interest. 2 ml 1%
Lidocaine was used to anesthetize the skin around the needle
insertion site.

A 21 gauge needle was inserted into the right knee joint under
fluoroscopy.

30 ml of contrast mixture (10 ml of sterile saline and 30 ml of
Isovue-M 200 contrast) were injected into the right knee joint.

The needle was removed and hemostasis was achieved.

LEFT KNEE:

The patient was placed supine on the fluoroscopy table and the left
knee joint was identified under fluoroscopy. The skin overlying the
left knee joint was subsequently cleaned with Betadine and a sterile
drape was placed over the area of interest. 2 ml 1% Lidocaine was
used to anesthetize the skin around the needle insertion site.

A 21 gauge needle was inserted into the left knee joint under
fluoroscopy.

30 ml of contrast mixture (10 ml of sterile saline and 30 ml of
Isovue-M 200 contrast) were injected into the left knee joint.

The needle was removed and hemostasis was achieved.

The patient was subsequently transferred to CT for imaging.
IMPRESSION: Technically successful bilateral knee injections for CT.

## 2021-03-10 ENCOUNTER — Telehealth: Payer: Self-pay | Admitting: Family Medicine

## 2021-03-10 NOTE — Telephone Encounter (Signed)
Sent patient a message letting her know her appointment was rescheduled due to Amy being out.

## 2021-03-12 ENCOUNTER — Ambulatory Visit: Payer: Medicaid Other | Admitting: Physical Therapy

## 2021-03-12 ENCOUNTER — Ambulatory Visit: Payer: Medicaid Other | Admitting: Family Medicine

## 2021-03-20 ENCOUNTER — Ambulatory Visit: Payer: Medicaid Other | Admitting: Allergy & Immunology

## 2021-03-24 ENCOUNTER — Encounter: Payer: Self-pay | Admitting: Physical Therapy

## 2021-03-24 ENCOUNTER — Ambulatory Visit: Payer: Medicaid Other | Admitting: Physical Therapy

## 2021-03-24 ENCOUNTER — Other Ambulatory Visit: Payer: Self-pay

## 2021-03-24 DIAGNOSIS — M25552 Pain in left hip: Secondary | ICD-10-CM

## 2021-03-24 DIAGNOSIS — M25551 Pain in right hip: Secondary | ICD-10-CM

## 2021-03-24 DIAGNOSIS — M25651 Stiffness of right hip, not elsewhere classified: Secondary | ICD-10-CM

## 2021-03-24 DIAGNOSIS — M6281 Muscle weakness (generalized): Secondary | ICD-10-CM

## 2021-03-24 DIAGNOSIS — M25561 Pain in right knee: Secondary | ICD-10-CM | POA: Diagnosis not present

## 2021-03-24 DIAGNOSIS — G8929 Other chronic pain: Secondary | ICD-10-CM

## 2021-03-24 DIAGNOSIS — R262 Difficulty in walking, not elsewhere classified: Secondary | ICD-10-CM

## 2021-03-24 NOTE — Patient Instructions (Signed)
      Voncille Lo, PT, [place] Certified Exercise Expert for the Aging Adult  03/24/21 11:42 AM Phone: [phone] Fax: [phone]

## 2021-03-24 NOTE — Therapy (Signed)
[place], [place], [address] Phone: [phone]   Fax:  [phone]  Physical Therapy Treatment  Patient Details  Name: Mercedes Parks MRN: [id number] Date of Birth: 01-May-1981 Referring Provider (PT): Mercedes Shown, MD   Encounter Date: 03/24/2021   PT End of Session - 03/24/21 1105     Visit Number 2    Number of Visits 12    Date for PT Re-Evaluation 04/13/21    Authorization Type Medicaid [place] 3 visits intially before reval 03-23-21 for additional visit approved    Authorization - Visit Number 2    Authorization - Number of Visits 12    PT Start Time 1104    PT Stop Time 1200    PT Time Calculation (min) 56 min    Activity Tolerance Patient tolerated treatment well    Behavior During Therapy [facility] for tasks assessed/performed             Past Medical History:  Diagnosis Date   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 Kellie Simmering   TONSILECTOMY, ADENOIDECTOMY, [place]      There were no vitals filed for this visit.   Subjective Assessment - 03/24/21 1107     Subjective I have pain on on R ( lateral thigh) and Over  medial knee with a lipoma present.    Patient is accompained by: Interpreter   Mercedes Parks   Pertinent History Epilepsy, chronic bilateral knee pain, hearing loss, cochlear implant    Limitations Standing;Other (comment)    Diagnostic tests x-ray & CT: see imaging tab    Patient Stated Goals reduce pain in the muscle and sleep through the night    Currently in Pain? Yes    Pain Score 6     Pain Location Knee    Pain Orientation Right;Left    Pain Descriptors / Indicators Tightness;Dull    Pain  Radiating Towards stabbing pain on left medial knee    Pain Onset More than a month ago    Pain Frequency Constant    Pain Location Hip    Pain Descriptors / Indicators Dull                               [facility] Adult PT Treatment/Exercise - 03/24/21 0001       Knee/Hip Exercises: Standing   Hip Abduction Stengthening;Right;Left;2 sets;10 reps    Abduction Limitations red t band    Hip Extension Stengthening;Right;Left;2 sets;10 reps    Extension Limitations red t band    Other Standing Knee Exercises monster walk red t band    Other Standing Knee Exercises squat at sink 3 x 10      Knee/Hip Exercises: Supine   Other Supine Knee/Hip Exercises trunk rotation for stretch o fIR and ER      Modalities   Modalities Moist Heat      Moist Heat Therapy   Number Minutes Moist Heat 12 Minutes    Moist Heat Location Knee   R and L  Manual Therapy   Manual Therapy Myofascial release    Manual therapy comments skilled palpation for TPDN    Myofascial Release TPR/STW to R distal ITB to reduce tone and pain              Trigger Point Dry Needling - 03/24/21 0001     Consent Given? Yes    Education Handout Provided Previously provided    Muscles Treated Lower Quadrant Quadriceps;Vastus lateralis   R only   Dry Needling Comments over medial knee on L Lipoma    Quadriceps Response Twitch response elicited;Palpable increased muscle length    Vastus lateralis Response Twitch response elicited;Palpable increased muscle length                  PT Education - 03/24/21 1142     Education Details added to HEP    Person(s) Educated Patient    Methods Explanation;Demonstration;Tactile cues;Verbal cues;Handout    Comprehension Verbalized understanding;Returned demonstration              PT Short Term Goals - 03/24/21 1118       PT SHORT TERM GOAL #1   Title Patient will be independent with HEP.    Baseline independent with all given    Time 3     Period Weeks    Status Achieved    Target Date 03/23/21               PT Long Term Goals - 03/02/21 1617       PT LONG TERM GOAL #1   Title Patient will be independent with advanced HEP    Baseline Pt with knee HEP but needs to continue hip/core    Time 6    Period Weeks    Status New    Target Date 04/13/21      PT LONG TERM GOAL #2   Title Patient will demonstrate R ER of hip to at least 45 degrees    Baseline R ER 23 degrees with hip stiffness    Time 6    Period Weeks    Status New    Target Date 04/13/21      PT LONG TERM GOAL #3   Title Patient will demonstrate 4+/5 or greater bilateral LE MMT to improve stability during functional tasks.    Baseline 4-/5  R hip/ 4-/5 /4/5 in Left   knee R 4+/5  L 5/5    Time 6    Period Weeks    Status New    Target Date 04/13/21      PT LONG TERM GOAL #4   Title Pt will negotiate stairs with good hip stability with step over step technique    Baseline step to step pattern. Hip weakness on R    Time 6    Period Weeks    Status New    Target Date 04/13/21      PT LONG TERM GOAL #5   Title Pt will report ability to sleep greater than 4 hours uninterrupted without waking due to rolling on side at night    Baseline Pt unable to sleep on Side at all    Time 6    Period Weeks    Status New    Target Date 04/13/21            Access Code: [email or web address]: [email or web address]: 06/28/2022Prepared by: Donnetta Simpers BeardsleyExercises   Side Stepping with Resistance at Feet - 1 x daily - 7 x weekly - 3  sets - 10 reps  Standing Hip Abduction with Resistance at Ankles and Counter Support - 1 x daily - 7 x weekly - 3 sets - 10 reps  Standing Hip Extension with Resistance at Ankles and Counter Support - 1 x daily - 7 x weekly - 3 sets - 10 reps       Plan - 03/24/21 1110     Clinical Impression Statement [name] presents to clinic and reports pain over medial knee where lipoma located sub medial patella and  disturbs her sleep when compressed at night.  Pt also complains of R latera thigh pain into knee over vastus lateralis.  Pt consents to TPDN  and is closely monitored and deaf interpreter explains all instructions and recieves consent.  Pt also states she does not think her exercises do anything.  [name] HEP progressed for hip/strength standing and given to pt.  Will continue to progress as pt is able.    Personal Factors and Comorbidities Comorbidity 2;Time since onset of injury/illness/exacerbation;Age    Comorbidities Epilepsy, chronic bilateral knee pain, hearing loss, cochlear implant, gastric bypass. ( loss of 100 lbs)    Examination-Activity Limitations Locomotion Level;Transfers;Stairs;Stand;Squat    PT Frequency 2x / week    PT Duration 6 weeks    PT Treatment/Interventions ADLs/Self Care Home Management;Cryotherapy;Electrical Stimulation;Iontophoresis 4mg /ml Dexamethasone;Moist Heat;Gait training;Stair training;Functional mobility training;Therapeutic activities;Therapeutic exercise;Balance training;Neuromuscular re-education;Manual techniques;Passive range of motion;Patient/family education;Vasopneumatic Device;Taping;Dry needling    PT Next Visit Plan add piriformis to HEP    PT Home Exercise Plan [id number]    Consulted and Agree with Plan of Care Patient             Patient will benefit from skilled therapeutic intervention in order to improve the following deficits and impairments:  Decreased activity tolerance, Decreased balance, Abnormal gait, Difficulty walking, Decreased range of motion, Pain, Decreased strength, Increased edema, Decreased mobility  Visit Diagnosis: Chronic pain of right knee  Pain in right hip  Chronic pain of left knee  Muscle weakness (generalized)  Pain in left hip  Stiffness of right hip, not elsewhere classified  Difficulty in walking, not elsewhere classified     Problem List Patient Active Problem List   Diagnosis Date Noted    Gastroesophageal reflux disease 12/17/2020   Food intolerance 12/17/2020   Panniculitis 09/30/2020   Insect bite 06/26/2018   Urticaria 01/24/2018   Seasonal and perennial allergic rhinitis 01/24/2018   Allergic conjunctivitis 01/24/2018   Coughing 01/24/2018   Fatigue 04/05/2016   Hypersomnia with sleep apnea 04/05/2016   Snoring 04/05/2016   Hearing loss sensory, bilateral 04/05/2016   Constipation 07/14/2015   Abdominal pain 07/14/2015   Generalized idiopathic epilepsy, not intractable, without status epilepticus ([place]) 02/03/2015   Voncille Lo, PT, [place] Certified Exercise Expert for the Aging Adult  03/24/21 1:08 PM Phone: [phone] Fax: [facility] [address] [place], [place], [address] Phone: [phone]   Fax:  [phone]  Name: Mercedes Parks MRN: [id number] Date of Birth: Feb 05, 1981

## 2021-03-31 ENCOUNTER — Encounter: Payer: Medicaid Other | Admitting: Physical Therapy

## 2021-03-31 ENCOUNTER — Ambulatory Visit: Payer: Medicaid Other | Admitting: Diagnostic Neuroimaging

## 2021-04-07 ENCOUNTER — Other Ambulatory Visit: Payer: Self-pay

## 2021-04-07 ENCOUNTER — Ambulatory Visit: Payer: Medicaid Other | Attending: Orthopaedic Surgery | Admitting: Physical Therapy

## 2021-04-07 ENCOUNTER — Encounter: Payer: Self-pay | Admitting: Physical Therapy

## 2021-04-07 ENCOUNTER — Encounter: Payer: Medicaid Other | Admitting: Physical Therapy

## 2021-04-07 DIAGNOSIS — G8929 Other chronic pain: Secondary | ICD-10-CM | POA: Diagnosis present

## 2021-04-07 DIAGNOSIS — M25552 Pain in left hip: Secondary | ICD-10-CM

## 2021-04-07 DIAGNOSIS — M25551 Pain in right hip: Secondary | ICD-10-CM | POA: Diagnosis present

## 2021-04-07 DIAGNOSIS — M25651 Stiffness of right hip, not elsewhere classified: Secondary | ICD-10-CM | POA: Insufficient documentation

## 2021-04-07 DIAGNOSIS — M25561 Pain in right knee: Secondary | ICD-10-CM | POA: Diagnosis present

## 2021-04-07 DIAGNOSIS — R262 Difficulty in walking, not elsewhere classified: Secondary | ICD-10-CM

## 2021-04-07 DIAGNOSIS — M6281 Muscle weakness (generalized): Secondary | ICD-10-CM | POA: Diagnosis present

## 2021-04-07 DIAGNOSIS — M25562 Pain in left knee: Secondary | ICD-10-CM | POA: Insufficient documentation

## 2021-04-07 NOTE — Therapy (Signed)
[place], [place], [address] Phone: [phone]   Fax:  [phone]  Physical Therapy Treatment/Recertification  Patient Details  Name: Mercedes Parks MRN: [id number] Date of Birth: 09-16-1981 Referring Provider (PT): Frankey Shown, MD   Encounter Date: 04/07/2021   PT End of Session - 04/07/21 1052     Visit Number 3    Number of Visits 15    Date for PT Re-Evaluation 05/19/21    Authorization Type Medicaid  3 visits intially before reval 03-23-21 for additional visit approved 6-07-02-29-22. 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 Kellie Simmering   TONSILECTOMY, ADENOIDECTOMY, BILATERAL MYRINGOTOMY AND TUBES      There were no vitals filed for this visit.   Subjective Assessment - 04/07/21 1103     Subjective Honestly I moved my best friend in a new home.  Swelling in Left knee.  When I had fluid taken off my R knee, my left knee feels like that.    Patient is accompained by: Interpreter   michael  king   Pertinent History Epilepsy, chronic bilateral knee pain, hearing loss, cochlear implant    Limitations Standing;Other (comment)    How long can you sit comfortably? when travelling I have to get out every 3 hours    How  long can you stand comfortably? shop for 1-2 hours    How long can you walk comfortably? I walk 30 min  to an hour    Diagnostic tests x-ray & CT: see imaging tab    Patient Stated Goals reduce pain in the muscle and sleep through the night    Currently in Pain? Yes    Pain Score 4     Pain Location Knee    Pain Orientation Left    Pain Descriptors / Indicators Tightness    Pain Type Chronic pain    Pain Onset More than a month ago    Pain Frequency Intermittent                [facility] PT Assessment - 04/07/21 0001       Assessment   Medical Diagnosis Chronic pain of right knee, Chronic pain of left knee    Referring Provider (PT) Frankey Shown, MD    Onset Date/Surgical Date 10/11/19    Hand Dominance Right      Observation/Other Assessments   Focus on Therapeutic Outcomes (FOTO)  no FOTO  MCD      ROM / Strength   AROM / PROM / Strength AROM;Strength  AROM   Overall AROM  Within functional limits for tasks performed    Right Hip Flexion 135    Right Hip External Rotation  40    Right Hip Internal Rotation  45    Left Hip Flexion 120    Left Hip External Rotation  45    Left Hip Internal Rotation  47    Right Knee Extension 0    Right Knee Flexion 140    Left Knee Extension 0    Left Knee Flexion 142      Strength   Strength Assessment Site Hip;Knee    Right/Left Hip Right;Left    Right Hip Flexion 4/5    Right Hip Extension 4-/5    Right Hip External Rotation  4-/5    Right Hip Internal Rotation 4/5    Right Hip ABduction 4-/5    Left Hip Flexion 5/5    Left Hip Extension 4+/5    Left Hip External Rotation 4+/5    Left Hip Internal Rotation 4+/5    Left Hip ABduction 4+/5    Right Knee Flexion 4+/5    Right Knee Extension 4+/5    Left Knee Flexion 5/5    Left Knee Extension 5/5                           [facility] Adult PT Treatment/Exercise - 04/07/21 0001       Ambulation/Gait   Stairs Yes    Stairs Assistance 7: Independent     Stair Management Technique Alternating pattern    Number of Stairs 8    Height of Stairs 6    Gait Comments evidence of R hip weakness on stairs  Trendelenberg      Knee/Hip Exercises: Standing   Hip Abduction Stengthening;Right;Left;2 sets;10 reps    Abduction Limitations red t band    Hip Extension Stengthening;Right;Left;2 sets;10 reps    Extension Limitations red t band    Lateral Step Up 2 sets;Hand Hold: 2;10 reps;Step Height: 6";Left    Forward Step Up Left;Hand Hold: 2;Step Height: 6";10 reps    Forward Step Up Limitations x2    Step Down Left;Hand Hold: 2;Step Height: 6";10 reps    Step Down Limitations x2    Wall Squat 1 set;10 reps;10 seconds      Knee/Hip Exercises: Supine   Other Supine Knee/Hip Exercises trunk rotation for stretch of IR and ER      Modalities   Modalities Moist Heat      Moist Heat Therapy   Number Minutes Moist Heat 12 Minutes    Moist Heat Location Knee;Hip   right only     Manual Therapy   Manual Therapy Myofascial release    Manual therapy comments skilled palpation for TPDN    Myofascial Release TPR/STW to R proxl ITB to reduce tone and pain and also piriformis              Trigger Point Dry Needling - 04/07/21 0001     Consent Given? Yes    Education Handout Provided Previously provided    Muscles Treated Back/Hip Piriformis;Gluteus maximus   right only   Dry Needling Comments 75 mm 30 gage    Gluteus Maximus Response Twitch response elicited;Palpable increased muscle length    Piriformis Response Twitch response elicited;Palpable increased muscle length                  PT Education - 04/07/21 1449  Education Details added to HEP for standing hip/knee strength.  asssessed for addition MCD visit    Person(s) Educated Patient    Methods Explanation;Demonstration;Tactile cues;Verbal cues;Handout    Comprehension Verbalized understanding;Returned demonstration              PT Short Term Goals - 04/07/21 1111        PT SHORT TERM GOAL #1   Title Patient will be independent with HEP.    Baseline independent with all given    Time 3    Period Weeks    Status Achieved    Target Date 03/23/21      PT SHORT TERM GOAL #2   Title Pt will tolerated trial of TPDN in order to reduce R hip/knee pain by atleast 25%    Baseline On eval 6/10 on R hip   now 4/10 on tight ness  dull pain on side.    sleeping at night    Time 3    Period Weeks    Status Achieved    Target Date 03/23/21      PT SHORT TERM GOAL #3   Title Pt will be able to sleep at least 3 hours uninterrupted and be able to tolerate lying on side for at least 30 minutes    Baseline eval unable to sleep longer than 2-3 hours and cannot tolerate lying of sides due to R/L hip pain   ( R > L) on eval.  I now turn from side  about every 90 min to 2 hours    Time 3    Period Weeks    Status Partially Met    Target Date 03/23/21               PT Long Term Goals - 04/07/21 1120       PT LONG TERM GOAL #1   Title Patient will be independent with advanced HEP    Baseline Pt with knee HEP but needs to continue hip/core    Time 6    Period Weeks    Status On-going    Target Date 05/19/21      PT LONG TERM GOAL #2   Title Patient will demonstrate R ER of hip to at least 45 degrees    Baseline R ER 23 degrees with hip stiffness eval  now it is 40 with pain in R buttock    Time 6    Period Weeks    Status On-going    Target Date 05/19/21      PT LONG TERM GOAL #3   Title Patient will demonstrate 4+/5 or greater bilateral LE MMT to improve stability during functional tasks.    Baseline 4/5  in Left  4-/5 Right hip   knee R 4+/5  L 5/5    Time 6    Period Weeks    Status On-going    Target Date 05/19/21      PT LONG TERM GOAL #4   Title Pt will negotiate stairs with good hip stability with step over step technique    Baseline step over step  pattern. exhibit  Hip weakness on R    Time 6    Period Weeks    Status On-going     Target Date 05/19/21      PT LONG TERM GOAL #5   Title Pt will report ability to sleep greater than 4 hours uninterrupted without waking due to rolling on side at night    Baseline  Pt now sleeping 90 min to 2 hours now    Time 6    Period Weeks    Status On-going    Target Date 05/19/21             Access Code: [email or web address]: [email or web address]: 07/12/2022Prepared by: Donnetta Simpers BeardsleyExercises   Wall Squat - 1 x daily - 7 x weekly - 3 sets - 10 reps - 10 sec hold  Forward Step Up - 1 x daily - 7 x weekly - 3 sets - 10 reps  Lateral Step Down - 1 x daily - 7 x weekly - 3 sets - 10 reps      Plan - 04/07/21 1059     Clinical Impression Statement Pt presents with deaf interpreter and no pain but complaint of tightness in R hip/ R knee., some swelling in Left knee post moving best friend to new home this past weekend.  Pt demonstrates R hip weakness on negotiating steps and was given progressive exericses for increasing hip strength.  Pt is now able to sleep on R side for 90 min/2 hours but then awakes due to pain to roll over at night, so sleep is improved but still disturbed.  AROM of R hip ER/IR is now symmetrical and WNL post 3 visits  Pt STG's all achieved /partially met.  Ms Caradonna will benefit from continued skilled PT to address LE Functional weakness.    Personal Factors and Comorbidities Comorbidity 2;Time since onset of injury/illness/exacerbation;Age    Comorbidities Epilepsy, chronic bilateral knee pain, hearing loss, cochlear implant, gastric bypass. ( loss of 100 lbs)    Examination-Activity Limitations Locomotion Level;Transfers;Stairs;Stand;Squat    Stability/Clinical Decision Making Evolving/Moderate complexity    Clinical Decision Making Moderate    Rehab Potential Good    PT Frequency 2x / week    PT Duration 6 weeks    PT Treatment/Interventions ADLs/Self Care Home Management;Cryotherapy;Electrical Stimulation;Iontophoresis 31m/ml  Dexamethasone;Moist Heat;Gait training;Stair training;Functional mobility training;Therapeutic activities;Therapeutic exercise;Balance training;Neuromuscular re-education;Manual techniques;Passive range of motion;Patient/family education;Vasopneumatic Device;Taping;Dry needling    PT Next Visit Plan add piriformis to HEP    PT Home Exercise Plan [email or web address]   Consulted and Agree with Plan of Care Patient             Patient will benefit from skilled therapeutic intervention in order to improve the following deficits and impairments:  Decreased activity tolerance, Decreased balance, Abnormal gait, Difficulty walking, Decreased range of motion, Pain, Decreased strength, Increased edema, Decreased mobility  Visit Diagnosis: Chronic pain of right knee  Pain in right hip  Chronic pain of left knee  Muscle weakness (generalized)  Pain in left hip  Stiffness of right hip, not elsewhere classified  Difficulty in walking, not elsewhere classified     Problem List Patient Active Problem List   Diagnosis Date Noted   Gastroesophageal reflux disease 12/17/2020   Food intolerance 12/17/2020   Panniculitis 09/30/2020   Insect bite 06/26/2018   Urticaria 01/24/2018   Seasonal and perennial allergic rhinitis 01/24/2018   Allergic conjunctivitis 01/24/2018   Coughing 01/24/2018   Fatigue 04/05/2016   Hypersomnia with sleep apnea 04/05/2016   Snoring 04/05/2016   Hearing loss sensory, bilateral 04/05/2016   Constipation 07/14/2015   Abdominal pain 07/14/2015   Generalized idiopathic epilepsy, not intractable, without [place] epilepticus [place] 02/03/2015    BDorothea Ogle7/08/2021, 2:54 PM  [facility] StreetGCambridge NAlaska [address]Phone: [phone]  Fax:  [phone] Name: HAmbra  DARIEL PELLECCHIA MRN: [id number] Date of Birth: 1981-01-16

## 2021-04-07 NOTE — Patient Instructions (Signed)
                                   Sleep Tips    Keep a consistent sleep schedule. Get up at the same time every day, even on weekends or during vacations. Set a bedtime that is early enough for you to get at least 7 hours of sleep. Don't go to bed unless you are sleepy.  If you don't fall asleep after 20 minutes, get out of bed.  Establish a relaxing bedtime routine.  Use your bed only for sleep and sex.  Make your bedroom quiet and relaxing. Keep the room at a comfortable, cool temperature.  Limit exposure to bright light in the evenings. Turn off electronic devices at least 30 minutes before bedtime. Don't eat a large meal before bedtime. If you are hungry at night, eat a light, healthy snack.  Exercise regularly and maintain a healthy diet.  Avoid consuming caffeine in the late afternoon or evening.  Avoid consuming alcohol before bedtime.  Reduce your fluid intake before bedtime.  Voncille Lo, PT, [place] Certified Exercise Expert for the Aging Adult  04/07/21 11:38 AM Phone: [phone] Fax: [phone]

## 2021-04-14 ENCOUNTER — Encounter: Payer: Medicaid Other | Admitting: Physical Therapy

## 2021-04-23 ENCOUNTER — Other Ambulatory Visit: Payer: Self-pay

## 2021-04-23 ENCOUNTER — Ambulatory Visit: Payer: Medicaid Other | Admitting: Physical Therapy

## 2021-04-23 ENCOUNTER — Encounter: Payer: Medicaid Other | Admitting: Physical Therapy

## 2021-04-23 ENCOUNTER — Encounter: Payer: Self-pay | Admitting: Physical Therapy

## 2021-04-23 DIAGNOSIS — M6281 Muscle weakness (generalized): Secondary | ICD-10-CM

## 2021-04-23 DIAGNOSIS — M25561 Pain in right knee: Secondary | ICD-10-CM | POA: Diagnosis not present

## 2021-04-23 DIAGNOSIS — M25562 Pain in left knee: Secondary | ICD-10-CM

## 2021-04-23 DIAGNOSIS — M25551 Pain in right hip: Secondary | ICD-10-CM

## 2021-04-23 DIAGNOSIS — R262 Difficulty in walking, not elsewhere classified: Secondary | ICD-10-CM

## 2021-04-23 DIAGNOSIS — M25651 Stiffness of right hip, not elsewhere classified: Secondary | ICD-10-CM

## 2021-04-23 DIAGNOSIS — G8929 Other chronic pain: Secondary | ICD-10-CM

## 2021-04-23 DIAGNOSIS — M25552 Pain in left hip: Secondary | ICD-10-CM

## 2021-04-23 NOTE — Patient Instructions (Signed)
    Voncille Lo, PT, [place] Certified Exercise Expert for the Aging Adult  04/23/21 8:28 AM Phone: [phone] Fax: [phone]

## 2021-04-23 NOTE — Therapy (Signed)
[place], [place], [address] Phone: [phone]   Fax:  [phone]  Physical Therapy Treatment  Patient Details  Name: Mercedes Parks MRN: [id number] Date of Birth: 07/24/1981 Referring Provider (PT): Frankey Shown, MD   Encounter Date: 04/23/2021   PT End of Session - 04/23/21 0807     Visit Number 4    Number of Visits 15    Date for PT Re-Evaluation 05/19/21    Authorization Type Medicaid [place] extended MCD [place] [employer]ME approved 12 PT visits from 7-28/2022 to 06-03-21 Auth# [id number] [facility]    Authorization - Visit Number 4    Authorization - Number of Visits 12    PT Start Time 0800    PT Stop Time 0900    PT Time Calculation (min) 60 min    Activity Tolerance Patient tolerated treatment well    Behavior During Therapy [facility] for tasks assessed/performed             Past Medical History:  Diagnosis Date   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 Kellie Simmering   TONSILECTOMY, ADENOIDECTOMY, [place]      There were no vitals filed for this visit.   Subjective Assessment - 04/23/21 0804     Subjective when I went to [place] for vacation I  did a lot of walking and I felt pain in the back of my knee and sometimes as I did more twisting of my knee, I noticed that it had more swelling.  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  No pain now.  but when I go up and down steps and hike a long time.  I went to the ortho doctor and he withdrew fluid from my knee. I fell like I am back to normal. I notice I have a fear of falling as I go up and down for  hills.    Patient is accompained by: Interpreter   carline Bussierre CAP   Pertinent History  Epilepsy, chronic bilateral knee pain, hearing loss, cochlear implant    Limitations Standing;Other (comment)    How long can you sit comfortably? drove to [place], I can only do an hour and then get back in. pressure in the front of my knee    How long can you stand comfortably? shop/ walking hiking 90 minutes and then take a break    How long can you walk comfortably? 90 minutes hike and then break    Diagnostic tests x-ray & CT: see imaging tab    Patient Stated Goals reduce pain in the muscle and sleep through the night    Pain Score 0-No pain   pt with 4/10 when exerting   Pain Location Knee    Pain Orientation Right;Left    Pain Descriptors / Indicators Tightness;Pressure    Pain Type Chronic pain    Pain Onset More than a month ago    Pain Frequency Intermittent  [facility] Adult PT Treatment/Exercise - 04/23/21 0001       Knee/Hip Exercises: Standing   Hip Abduction Stengthening;Right;Left;2 sets;10 reps    Abduction Limitations red t band    Hip Extension Stengthening;Right;Left;2 sets;10 reps    Extension Limitations red t band    Wall Squat 1 set;5 reps    Wall Squat Limitations 45 sec hold x 3 and 30 sec x 2    Other Standing Knee Exercises goblet squat 2 x 10 20 # KB      Knee/Hip Exercises: Supine   Other Supine Knee/Hip Exercises hamstring bil set for 45 sec x 5 90 sec rest      Modalities   Modalities Moist Heat      Moist Heat Therapy   Number Minutes Moist Heat 12 Minutes    Moist Heat Location Knee   R     Manual Therapy   Manual Therapy Myofascial release;Soft tissue mobilization    Manual therapy comments skilled palpation for TPDN    Soft tissue mobilization IASTYM to Right quad and Hamtring    Myofascial Release TPR/STW to R distal ITB  and quad to reduce tone and pain R hamstring              Trigger Point Dry Needling - 04/23/21 0001     Consent Given? Yes    Education Handout Provided Previously  provided    Muscles Treated Lower Quadrant Vastus lateralis;Rectus femoris;Hamstring   only R   Quadriceps Response Twitch response elicited;Palpable increased muscle length    Rectus femoris Response Twitch response elicited;Palpable increased muscle length    Vastus lateralis Response Twitch response elicited;Palpable increased muscle length    Hamstring Response Twitch response elicited;Palpable increased muscle length                  PT Education - 04/23/21 0829     Education Details added lunge and 45 sec hamstring set to HEP.    Person(s) Educated Patient    Methods Explanation;Demonstration;Tactile cues;Verbal cues;Handout    Comprehension Verbalized understanding;Returned demonstration              PT Short Term Goals - 04/07/21 1111       PT SHORT TERM GOAL #1   Title Patient will be independent with HEP.    Baseline independent with all given    Time 3    Period Weeks    Status Achieved    Target Date 03/23/21      PT SHORT TERM GOAL #2   Title Pt will tolerated trial of TPDN in order to reduce R hip/knee pain by atleast 25%    Baseline On eval 6/10 on R hip   now 4/10 on tight ness  dull pain on side.    sleeping at night    Time 3    Period Weeks    Status Achieved    Target Date 03/23/21      PT SHORT TERM GOAL #3   Title Pt will be able to sleep at least 3 hours uninterrupted and be able to tolerate lying on side for at least 30 minutes    Baseline eval unable to sleep longer than 2-3 hours and cannot tolerate lying of sides due to R/L hip pain   ( R > L) on eval.  I now turn from side  about every 90 min to 2 hours    Time 3    Period Weeks    Status Partially  Met    Target Date 03/23/21               PT Long Term Goals - 04/07/21 1120       PT LONG TERM GOAL #1   Title Patient will be independent with advanced HEP    Baseline Pt with knee HEP but needs to continue hip/core    Time 6    Period Weeks    Status On-going    Target  Date 05/19/21      PT LONG TERM GOAL #2   Title Patient will demonstrate R ER of hip to at least 45 degrees    Baseline R ER 23 degrees with hip stiffness eval  now it is 40 with pain in R buttock    Time 6    Period Weeks    Status On-going    Target Date 05/19/21      PT LONG TERM GOAL #3   Title Patient will demonstrate 4+/5 or greater bilateral LE MMT to improve stability during functional tasks.    Baseline 4/5  in Left  4-/5 Right hip   knee R 4+/5  L 5/5    Time 6    Period Weeks    Status On-going    Target Date 05/19/21      PT LONG TERM GOAL #4   Title Pt will negotiate stairs with good hip stability with step over step technique    Baseline step over step  pattern. 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  She reports pressure and tightness.  She is trying to increase exercises and wantes to get stronger.  She has deaf interpreter communicationg during entire sesion.  Pt consents to TPDN and is closely monitored throughout session.  HEP progressed ffor strength with standing wall squat and hamstring isometric for 45 sec hold  Will continue POC    Personal Factors and Comorbidities Comorbidity 2;Time since onset of injury/illness/exacerbation;Age    Comorbidities Epilepsy, chronic bilateral knee pain, hearing loss, cochlear implant, gastric bypass. ( loss of 100 lbs)    Examination-Activity Limitations Locomotion Level;Transfers;Stairs;Stand;Squat     PT Frequency 2x / week    PT Duration 6 weeks    PT Treatment/Interventions ADLs/Self Care Home Management;Cryotherapy;Electrical Stimulation;Iontophoresis 31m/ml Dexamethasone;Moist Heat;Gait training;Stair training;Functional mobility training;Therapeutic activities;Therapeutic exercise;Balance training;Neuromuscular re-education;Manual techniques;Passive range of motion;Patient/family education;Vasopneumatic Device;Taping;Dry needling    PT Next Visit Plan add piriformis to HEP and increase standing strength  Check lunge    PT Home Exercise Plan [id number]   Consulted and Agree with Plan of Care Patient             Patient will benefit from skilled therapeutic intervention in  order to improve the following deficits and impairments:  Decreased activity tolerance, Decreased balance, Abnormal gait, Difficulty walking, Decreased range of motion, Pain, Decreased strength, Increased edema, Decreased mobility  Visit Diagnosis: Chronic pain of right knee  Pain in right hip  Chronic pain of left knee  Pain in left hip  Muscle weakness (generalized)  Stiffness of right hip, not elsewhere classified  Difficulty in walking, not elsewhere classified Access Code: [email or web address]: [email or web address]: 07/28/2022Prepared by: Donnetta Simpers BeardsleyExercises   Supine Bilateral Hamstring Sets - 1 x daily - 7 x weekly - 1 sets - 5 reps - 30-45 hold  Reverse Lunge - 3 x daily - 7 x weekly - 1 sets - 10 reps    Problem List Patient Active Problem List   Diagnosis Date Noted   Gastroesophageal reflux disease 12/17/2020   Food intolerance 12/17/2020   Panniculitis 09/30/2020   Insect bite 06/26/2018   Urticaria 01/24/2018   Seasonal and perennial allergic rhinitis 01/24/2018   Allergic conjunctivitis 01/24/2018   Coughing 01/24/2018   Fatigue 04/05/2016   Hypersomnia with sleep apnea 04/05/2016   Snoring 04/05/2016   Hearing loss sensory, bilateral 04/05/2016   Constipation  07/14/2015   Abdominal pain 07/14/2015   Generalized idiopathic epilepsy, not intractable, without status epilepticus ([place]) 02/03/2015   Voncille Lo, PT, [place] Certified Exercise Expert for the Aging Adult  04/23/21 12:37 PM Phone: [phone] Fax: [place] [facility] [address] [place], [place], [address] Phone: [phone]   Fax:  [phone]  Name: Mercedes Parks MRN: [id number] Date of Birth: 1980-10-28

## 2021-04-28 ENCOUNTER — Encounter: Payer: Medicaid Other | Admitting: Physical Therapy

## 2021-04-30 ENCOUNTER — Encounter: Payer: Medicaid Other | Admitting: Physical Therapy

## 2021-05-05 ENCOUNTER — Ambulatory Visit: Payer: Medicaid Other | Admitting: Physical Therapy

## 2021-05-05 ENCOUNTER — Telehealth: Payer: Self-pay | Admitting: Speech Pathology

## 2021-05-05 NOTE — Telephone Encounter (Signed)
SLP called and left a voicemail using interpreting service to cancel today's appointment. SLP encouraged patient to contact clinic to reschedule.

## 2021-05-07 ENCOUNTER — Other Ambulatory Visit: Payer: Self-pay

## 2021-05-07 ENCOUNTER — Ambulatory Visit: Payer: Medicaid Other | Attending: Orthopaedic Surgery | Admitting: Physical Therapy

## 2021-05-07 ENCOUNTER — Encounter: Payer: Self-pay | Admitting: Physical Therapy

## 2021-05-07 DIAGNOSIS — M25651 Stiffness of right hip, not elsewhere classified: Secondary | ICD-10-CM | POA: Diagnosis present

## 2021-05-07 DIAGNOSIS — M25562 Pain in left knee: Secondary | ICD-10-CM | POA: Diagnosis present

## 2021-05-07 DIAGNOSIS — M6281 Muscle weakness (generalized): Secondary | ICD-10-CM

## 2021-05-07 DIAGNOSIS — G8929 Other chronic pain: Secondary | ICD-10-CM | POA: Diagnosis present

## 2021-05-07 DIAGNOSIS — M25551 Pain in right hip: Secondary | ICD-10-CM

## 2021-05-07 DIAGNOSIS — M25552 Pain in left hip: Secondary | ICD-10-CM

## 2021-05-07 DIAGNOSIS — R262 Difficulty in walking, not elsewhere classified: Secondary | ICD-10-CM | POA: Diagnosis present

## 2021-05-07 DIAGNOSIS — M25561 Pain in right knee: Secondary | ICD-10-CM | POA: Insufficient documentation

## 2021-05-07 NOTE — Therapy (Signed)
[place], [place], [address] Phone: [phone]   Fax:  [phone]  Physical Therapy Treatment  Patient Details  Name: Mercedes Parks MRN: [id number] Date of Birth: [birth year] Referring Provider (PT): Frankey Shown, MD   Encounter Date: 05/07/2021   PT End of Session - 05/07/21 1020     Visit Number 5    Number of Visits 15    Date for PT Re-Evaluation 05/19/21    Authorization Type Medicaid [place] extended MCD [place] CCME approved 12 PT visits from 7-28/2022 to 06-03-21 Auth# [id number] [facility]    Authorization - Visit Number 5    Authorization - Number of Visits 12    PT Start Time 1016    PT Stop Time 1102    PT Time Calculation (min) 46 min    Activity Tolerance Patient tolerated treatment well    Behavior During Therapy [facility] for tasks assessed/performed             Past Medical History:  Diagnosis Date   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 Kellie Simmering   TONSILECTOMY, ADENOIDECTOMY, [place]      There were no vitals filed for this visit.   Subjective Assessment - 05/07/21 1021     Subjective I had sciatic nerve pain since Saturday or Sunday. 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  I think I stretched it.    Patient is accompained by: Interpreter    Pertinent History Epilepsy, chronic bilateral knee pain, hearing loss, cochlear implant    Limitations Standing;Other (comment)    Diagnostic tests x-ray & CT: see imaging tab    Patient Stated Goals reduce pain in the muscle and sleep through the night    Currently in Pain? Yes    Pain Score 0-No pain    Pain Location Knee    Pain  Orientation Right;Left    Pain Score 1    Pain Location Hip    Pain Orientation Left    Pain Descriptors / Indicators Dull    Pain Type Chronic pain    Pain Onset More than a month ago                [facility] PT Assessment - 05/07/21 0001       Assessment   Medical Diagnosis Chronic pain of right knee, Chronic pain of left knee    Referring Provider (PT) Frankey Shown, MD    Onset Date/Surgical Date 10/11/19    Hand Dominance Right      Observation/Other Assessments   Focus on Therapeutic Outcomes (FOTO)  no FOTO  MCD      AROM   Right Hip External Rotation  43    Left Hip External Rotation  45                           [facility] Adult PT Treatment/Exercise - 05/07/21 0001       Knee/Hip Exercises: Standing   Hip Abduction Stengthening;Right;Left;2 sets;10 reps    Abduction Limitations red  t band    Hip Extension Stengthening;Right;Left;2 sets;10 reps    Extension Limitations red t band    Wall Squat 1 set;5 reps    Wall Squat Limitations 45 sec hold x 3 and 30 sec x 2    Other Standing Knee Exercises goblet squat 2 x 10 25 # KB  deadlift with 45# KB 3 x 8      Knee/Hip Exercises: Supine   Other Supine Knee/Hip Exercises sciatic nerve glide    Other Supine Knee/Hip Exercises hamstring bil set for 45 sec x 5 90 sec rest              Trigger Point Dry Needling - 05/07/21 0001     Consent Given? Yes    Education Handout Provided Previously provided    Muscles Treated Back/Hip Gluteus minimus;Gluteus medius   Left side only   Gluteus Minimus Response Twitch response elicited;Palpable increased muscle length    Gluteus Medius Response Twitch response elicited;Palpable increased muscle length    Gluteus Maximus Response Twitch response elicited;Palpable increased muscle length    Piriformis Response Twitch response elicited;Palpable increased muscle length   Left                 PT Education - 05/07/21 1230     Education Details added to  HEP    Person(s) Educated Patient    Methods Explanation;Demonstration;Tactile cues;Verbal cues;Handout    Comprehension Returned demonstration;Verbalized understanding              PT Short Term Goals - 04/07/21 1111       PT SHORT TERM GOAL #1   Title Patient will be independent with HEP.    Baseline independent with all given    Time 3    Period Weeks    Status Achieved    Target Date 03/23/21      PT SHORT TERM GOAL #2   Title Pt will tolerated trial of TPDN in order to reduce R hip/knee pain by atleast 25%    Baseline On eval 6/10 on R hip   now 4/10 on tight ness  dull pain on side.    sleeping at night    Time 3    Period Weeks    Status Achieved    Target Date 03/23/21      PT SHORT TERM GOAL #3   Title Pt will be able to sleep at least 3 hours uninterrupted and be able to tolerate lying on side for at least 30 minutes    Baseline eval unable to sleep longer than 2-3 hours and cannot tolerate lying of sides due to R/L hip pain   ( R > L) on eval.  I now turn from side  about every 90 min to 2 hours    Time 3    Period Weeks    Status Partially Met    Target Date 03/23/21               PT Long Term Goals - 05/07/21 1030       PT LONG TERM GOAL #1   Title Patient will be independent with advanced HEP    Baseline Pt with knee HEP but needs to continue hip/core needs to add gym equimpment for LE strength    Time 6    Period Weeks    Status On-going      PT LONG TERM GOAL #2   Title Patient will demonstrate R ER of hip to at  least 45 degrees    Baseline R ER 23 degrees with hip stiffness eval  now it is 43 with pain in R buttock Left 45    Time 6    Period Weeks    Status On-going      PT LONG TERM GOAL #3   Title Patient will demonstrate 4+/5 or greater bilateral LE MMT to improve stability during functional tasks.    Time 6    Period Weeks    Status Unable to assess      PT LONG TERM GOAL #4   Title Pt will negotiate stairs with good hip  stability with step over step technique    Baseline using one step at a time due to knee and hip pain    Time 6    Period Weeks    Status On-going      PT LONG TERM GOAL #5   Title Pt will report ability to sleep greater than 4 hours uninterrupted without waking due to rolling on side at night    Baseline Pt now reports sleeping 6-7 hours    Time 6    Period Weeks    Status Achieved                   Plan - 05/07/21 1044     Clinical Impression Statement Pt enters with nopain in knees today but left hip iwth 1/10 and requested TPDN which was beneficial to pain releif. Ms Kiger was able to perfom exercises with no pain and able to handle challenging weights and exercises. Pt notices she has more problems on steps and tends to protect by taking one step at a time now.  Pt seems to be able to handle pain management and was also educatied on sciatic nerve glide and will continue with progressive strengthening for remaining visits    Personal Factors and Comorbidities Comorbidity 2;Time since onset of injury/illness/exacerbation;Age    Comorbidities Epilepsy, chronic bilateral knee pain, hearing loss, cochlear implant, gastric bypass. ( loss of 100 lbs)    Examination-Activity Limitations Locomotion Level;Transfers;Stairs;Stand;Squat    PT Frequency 2x / week    PT Duration 6 weeks    PT Treatment/Interventions ADLs/Self Care Home Management;Cryotherapy;Electrical Stimulation;Iontophoresis 86m/ml Dexamethasone;Moist Heat;Gait training;Stair training;Functional mobility training;Therapeutic activities;Therapeutic exercise;Balance training;Neuromuscular re-education;Manual techniques;Passive range of motion;Patient/family education;Vasopneumatic Device;Taping;Dry needling    PT Next Visit Plan gym work education    [id number] [phone]   Consulted and Agree with Plan of Care Patient             Patient will benefit from skilled therapeutic intervention in order to  improve the following deficits and impairments:  Decreased activity tolerance, Decreased balance, Abnormal gait, Difficulty walking, Decreased range of motion, Pain, Decreased strength, Increased edema, Decreased mobility  Visit Diagnosis: Chronic pain of right knee  Pain in right hip  Chronic pain of left knee  Pain in left hip  Muscle weakness (generalized)  Stiffness of right hip, not elsewhere classified  Difficulty in walking, not elsewhere classified     Problem List Patient Active Problem List   Diagnosis Date Noted   Gastroesophageal reflux disease 12/17/2020   Food intolerance 12/17/2020   Panniculitis 09/30/2020   Insect bite 06/26/2018   Urticaria 01/24/2018   Seasonal and perennial allergic rhinitis 01/24/2018   Allergic conjunctivitis 01/24/2018   Coughing 01/24/2018   Fatigue 04/05/2016   Hypersomnia with sleep apnea 04/05/2016   Snoring 04/05/2016   Hearing loss sensory, bilateral  04/05/2016   Constipation 07/14/2015   Abdominal pain 07/14/2015   Generalized idiopathic epilepsy, not intractable, without status epilepticus ([place]) 02/03/2015    Voncille Lo, PT, [place] Certified Exercise Expert for the Aging Adult  05/07/21 12:46 PM Phone: [phone] Fax: [place] [facility] [address] [place], [place], [address] Phone: [phone]   Fax:  [phone]  Name: Mercedes Parks MRN: [id number] Date of Birth: 03/20/1981

## 2021-05-07 NOTE — Patient Instructions (Signed)
  Voncille Lo, PT, [place] Certified Exercise Expert for the Aging Adult  05/07/21 10:56 AM Phone: [phone] Fax: [phone]

## 2021-05-12 ENCOUNTER — Ambulatory Visit: Payer: Medicaid Other | Admitting: Diagnostic Neuroimaging

## 2021-05-12 ENCOUNTER — Encounter: Payer: Medicaid Other | Admitting: Physical Therapy

## 2021-05-13 ENCOUNTER — Ambulatory Visit: Payer: Medicaid Other | Admitting: Diagnostic Neuroimaging

## 2021-05-14 ENCOUNTER — Encounter: Payer: Medicaid Other | Admitting: Physical Therapy

## 2021-05-19 ENCOUNTER — Other Ambulatory Visit: Payer: Self-pay

## 2021-05-19 ENCOUNTER — Ambulatory Visit: Payer: Medicaid Other | Admitting: Physical Therapy

## 2021-05-19 ENCOUNTER — Encounter: Payer: Self-pay | Admitting: Physical Therapy

## 2021-05-19 DIAGNOSIS — R262 Difficulty in walking, not elsewhere classified: Secondary | ICD-10-CM

## 2021-05-19 DIAGNOSIS — M25552 Pain in left hip: Secondary | ICD-10-CM

## 2021-05-19 DIAGNOSIS — M6281 Muscle weakness (generalized): Secondary | ICD-10-CM

## 2021-05-19 DIAGNOSIS — M25562 Pain in left knee: Secondary | ICD-10-CM

## 2021-05-19 DIAGNOSIS — M25551 Pain in right hip: Secondary | ICD-10-CM

## 2021-05-19 DIAGNOSIS — G8929 Other chronic pain: Secondary | ICD-10-CM

## 2021-05-19 DIAGNOSIS — M25651 Stiffness of right hip, not elsewhere classified: Secondary | ICD-10-CM

## 2021-05-19 DIAGNOSIS — M25561 Pain in right knee: Secondary | ICD-10-CM | POA: Diagnosis not present

## 2021-05-19 NOTE — Therapy (Signed)
[place], [place], [address] Phone: [phone]   Fax:  [phone]  Physical Therapy Treatment  Patient Details  Name: Mercedes Parks MRN: [id number] Date of Birth: [birth year] Referring Provider (PT): Frankey Shown, MD   Encounter Date: 05/19/2021   PT End of Session - 05/19/21 1133     Visit Number 6    Number of Visits 15    Date for PT Re-Evaluation 05/19/21    Authorization Type Medicaid [place] extended MCD [place] CCME approved 12 PT visits from 7-28/2022 to 06-03-21 Auth# [id number] [facility]    Authorization - Visit Number 6    Authorization - Number of Visits 12    PT Start Time 1016    PT Stop Time 1100    PT Time Calculation (min) 44 min    Activity Tolerance Patient tolerated treatment well    Behavior During Therapy [facility] for tasks assessed/performed             Past Medical History:  Diagnosis Date   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 Kellie Simmering   TONSILECTOMY, ADENOIDECTOMY, [place]      There were no vitals filed for this visit.   Subjective Assessment - 05/19/21 1022     Subjective I feel good. I have a gym to do to . I go to [employer]    Patient is accompained by: Interpreter   [name]   Pertinent History Epilepsy, chronic bilateral knee pain, hearing loss, cochlear implant    Limitations Standing;Other (comment)    Currently in Pain? No/denies    Pain Score 0-No pain    Pain Location Knee    Pain Orientation Right               Working on [employer]  Leg press,   If you want more work on [place]. Place feel lower on plate If you want more work on buttocks. Place feet higher on  plate.  Education with specific cuing for injury prevention. Using ramp up sets. Start at 105# for 10,  Then  115# for 10 Then 125 for 10  Importance of rests between sets for heavy lifting  On Leg extension machine hold for 3 seconds 3 x 10 with rest  between  1-2 minutes, pt told to walk around  #35 On hamstring flexion machine 3 x 10 25# KB    Hip Hinge first, ( Like holding groceries and pushing car door with buttocks.  ( Get back ready to lift.) Deadlift  45 # with good motion  3 x 8 with rest between Lift bar #45 lb with 2 10# plates.  ( Total 65#)  x 2 x 6  tried 75# but not ready  Reverse lunge with UE support 1 x 10 on R and L Trying with box for box squat.  Usiing 25 # KB, able to squat 80%                       PT Education - 05/19/21 1133     Education Details added  to HEP for gym equipment training with retrun Anselmo Pickler) Educated Patient    Methods Explanation;Demonstration;Tactile cues;Verbal cues;Handout    Comprehension Verbalized understanding;Returned demonstration              PT Short Term Goals - 04/07/21 1111       PT SHORT TERM GOAL #1   Title Patient will be independent with HEP.    Baseline independent with all given    Time 3    Period Weeks    Status Achieved    Target Date 03/23/21      PT SHORT TERM GOAL #2   Title Pt will tolerated trial of TPDN in order to reduce R hip/knee pain by atleast 25%    Baseline On eval 6/10 on R hip   now 4/10 on tight ness  dull pain on side.    sleeping at night    Time 3    Period Weeks    Status Achieved    Target Date 03/23/21      PT SHORT TERM GOAL #3   Title Pt will be able to sleep at least 3 hours uninterrupted and be able to tolerate lying on side for at least 30 minutes    Baseline eval unable to sleep longer than 2-3 hours and cannot tolerate lying of sides due to R/L hip pain   ( R > L) on eval.  I now turn from side  about every 90 min to 2 hours    Time 3     Period Weeks    Status Partially Met    Target Date 03/23/21               PT Long Term Goals - 05/07/21 1030       PT LONG TERM GOAL #1   Title Patient will be independent with advanced HEP    Baseline Pt with knee HEP but needs to continue hip/core needs to add gym equimpment for LE strength    Time 6    Period Weeks    Status On-going      PT LONG TERM GOAL #2   Title Patient will demonstrate R ER of hip to at least 45 degrees    Baseline R ER 23 degrees with hip stiffness eval  now it is 43 with pain in R buttock Left 45    Time 6    Period Weeks    Status On-going      PT LONG TERM GOAL #3   Title Patient will demonstrate 4+/5 or greater bilateral LE MMT to improve stability during functional tasks.    Time 6    Period Weeks    Status Unable to assess      PT LONG TERM GOAL #4   Title Pt will negotiate stairs with good hip stability with step over step technique    Baseline using one step at a time due to knee and hip pain    Time 6    Period Weeks    Status On-going      PT LONG TERM GOAL #5   Title Pt will report ability to sleep greater than 4 hours uninterrupted without waking due to rolling on side at night    Baseline Pt now reports sleeping 6-7 hours    Time 6    Period Weeks    Status Achieved  Plan - 05/19/21 1038     Clinical Impression Statement Pt enters with deaf interpreter and reports no pain.  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  Pt was educated and had her return demo for equipment she would encounter in gym.  Pt is making good progress.  and will probably Dc in next 2 visits to continue strengthening at home.  Pt does use some compensation and valgus knees for deadlifting etc . Will try to reinforce good form for injury prevention before DC    Personal Factors and Comorbidities Comorbidity 2;Time since onset of  injury/illness/exacerbation;Age    Comorbidities Epilepsy, chronic bilateral knee pain, hearing loss, cochlear implant, gastric bypass. ( loss of 100 lbs)    Examination-Activity Limitations Locomotion Level;Transfers;Stairs;Stand;Squat    PT Frequency 2x / week    PT Duration 6 weeks    PT Treatment/Interventions ADLs/Self Care Home Management;Cryotherapy;Electrical Stimulation;Iontophoresis 1m/ml Dexamethasone;Moist Heat;Gait training;Stair training;Functional mobility training;Therapeutic activities;Therapeutic exercise;Balance training;Neuromuscular re-education;Manual techniques;Passive range of motion;Patient/family education;Vasopneumatic Device;Taping;Dry needling    PT Next Visit Plan gym work education 1-2 visits ,  Goals next visit [phone]   Consulted and Agree with Plan of Care Patient             Patient will benefit from skilled therapeutic intervention in order to improve the following deficits and impairments:  Decreased activity tolerance, Decreased balance, Abnormal gait, Difficulty walking, Decreased range of motion, Pain, Decreased strength, Increased edema, Decreased mobility  Visit Diagnosis: Chronic pain of right knee  Pain in right hip  Chronic pain of left knee  Pain in left hip  Muscle weakness (generalized)  Stiffness of right hip, not elsewhere classified  Difficulty in walking, not elsewhere classified     Problem List Patient Active Problem List   Diagnosis Date Noted   Gastroesophageal reflux disease 12/17/2020   Food intolerance 12/17/2020   Panniculitis 09/30/2020   Insect bite 06/26/2018   Urticaria 01/24/2018   Seasonal and perennial allergic rhinitis 01/24/2018   Allergic conjunctivitis 01/24/2018   Coughing 01/24/2018   Fatigue 04/05/2016   Hypersomnia with sleep apnea 04/05/2016   Snoring 04/05/2016   Hearing loss sensory, bilateral 04/05/2016   Constipation 07/14/2015   Abdominal pain  07/14/2015   Generalized idiopathic epilepsy, not intractable, without status epilepticus ([place] 02/03/2015   LVoncille Lo PT, ALittle CreekCertified Exercise Expert for the Aging Adult  05/19/21 11:40 AM Phone: [phone]: 3[facility]teCNorth[facility] [address]Phone: [phone]  Fax:  [phone] Name: HETHELEEN VALTIERRAMRN: [id number] of Birth: 1Nov 01, 1982

## 2021-05-19 NOTE — Patient Instructions (Addendum)
Working on [employer],   If you want more work on [employer]. Place feel lower on plate If you want more work on buttocks. Place feet higher on plate.   Using ramp up sets. Start at 105# for 10,  Then  115# for 10 Then 125 for 10  On Leg extension machine hold for 3 seconds 3 x 10 with rest  between  1-2 minutes, pt told to walk around  #35 On hamstring flexion machine 3 x 10 25# KB  Hip Hinge first, ( Like holding groceries and pushing car door with buttocks.  ( Get back ready to lift.) Deadlift  45 # with good motion Lift bar #45 lb with 2 10# plates.  ( Total 65#)  x 2 x 6  tried 75# but not ready  Reverse lunge with UE support 1 x 10 on R and L Trying with box for box squat.   Voncille Lo, PT, [place] Certified Exercise Expert for the Aging Adult  05/19/21 11:02 AM Phone: [phone] Fax: [phone]

## 2021-05-21 ENCOUNTER — Ambulatory Visit: Payer: Medicaid Other | Admitting: Physical Therapy

## 2021-05-26 ENCOUNTER — Other Ambulatory Visit: Payer: Self-pay

## 2021-05-26 ENCOUNTER — Ambulatory Visit: Payer: Medicaid Other | Admitting: Physical Therapy

## 2021-05-26 ENCOUNTER — Encounter: Payer: Self-pay | Admitting: Physical Therapy

## 2021-05-26 DIAGNOSIS — R262 Difficulty in walking, not elsewhere classified: Secondary | ICD-10-CM

## 2021-05-26 DIAGNOSIS — M25552 Pain in left hip: Secondary | ICD-10-CM

## 2021-05-26 DIAGNOSIS — M25651 Stiffness of right hip, not elsewhere classified: Secondary | ICD-10-CM

## 2021-05-26 DIAGNOSIS — M25562 Pain in left knee: Secondary | ICD-10-CM

## 2021-05-26 DIAGNOSIS — G8929 Other chronic pain: Secondary | ICD-10-CM

## 2021-05-26 DIAGNOSIS — M6281 Muscle weakness (generalized): Secondary | ICD-10-CM

## 2021-05-26 DIAGNOSIS — M25561 Pain in right knee: Secondary | ICD-10-CM | POA: Diagnosis not present

## 2021-05-26 DIAGNOSIS — M25551 Pain in right hip: Secondary | ICD-10-CM

## 2021-05-26 NOTE — Therapy (Signed)
[place], [place], [address] Phone: [phone]   Fax:  [phone]  Physical Therapy Treatment/Discharge Note  Patient Details  Name: Mercedes Parks MRN: [id number] Date of Birth: 1981-02-04 Referring Provider (PT): Frankey Shown, MD   Encounter Date: 05/26/2021   PT End of Session - 05/26/21 1332     Visit Number 7    Number of Visits 15    Date for PT Re-Evaluation 05/19/21    Authorization Type Medicaid [place] extended MCD [place] CCME approved 12 PT visits from 7-28/2022 to 06-03-21 Auth# [id number] [facility]    Authorization - Visit Number 7    Authorization - Number of Visits 12    PT Start Time 1016    PT Stop Time 1100    PT Time Calculation (min) 44 min    Activity Tolerance Patient tolerated treatment well    Behavior During Therapy [facility] for tasks assessed/performed             Past Medical History:  Diagnosis Date   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 Kellie Simmering   TONSILECTOMY, ADENOIDECTOMY, [place]      There were no vitals filed for this visit.   Subjective Assessment - 05/26/21 1023     Subjective I went to an Urgent care for my back last Thursday,  My knees have been fine.  I got an injection my back  but it did not really feel better.  Last Wednesday I went to a storage unit and I lifted something and I hurt my back    Patient is accompained by: Interpreter   Tarry Kos   Pertinent History Epilepsy, chronic bilateral knee pain, hearing loss, cochlear implant    Limitations Standing;Other (comment)    How long can you sit comfortably? sit unlimited    How long can you stand comfortably? shop/ walking hiking 4  hours with breaks    How long can you walk comfortably? can hike for 4 hours with breaks    Diagnostic tests x-ray & CT: see imaging tab    Patient Stated Goals reduce pain in the muscle and sleep through the night    Currently in Pain? No/denies    Pain Score 0-No pain    Pain Location Knee    Multiple Pain Sites Yes    Pain Score 5    Pain Location Back    Pain Orientation Left    Pain Descriptors / Indicators Dull;Penetrating    Pain Type Acute pain                [facility] PT Assessment - 05/26/21 0001       Assessment   Medical Diagnosis Chronic pain of right knee, Chronic pain of left knee    Referring Provider (PT) Frankey Shown, MD    Onset Date/Surgical Date 10/11/19    Hand Dominance Right      Observation/Other Assessments   Focus on Therapeutic Outcomes (FOTO)  no FOTO  MCD      AROM   Right Hip Flexion 136    Right Hip External  Rotation  67    Right Hip Internal Rotation  45    Left Hip Flexion 120    Left Hip External Rotation  55    Left Hip Internal Rotation  47    Right Knee Extension 0    Right Knee Flexion 140    Left Knee Extension 0    Left Knee Flexion 142      Strength   Right Hip Flexion 5/5    Right Hip Extension 4+/5    Right Hip External Rotation  5/5    Right Hip Internal Rotation 5/5    Right Hip ABduction 4+/5    Left Hip Flexion 5/5    Left Hip Extension 5/5    Left Hip External Rotation 5/5    Left Hip Internal Rotation 5/5    Left Hip ABduction 5/5    Right Knee Flexion 5/5    Right Knee Extension 5/5    Left Knee Flexion 5/5    Left Knee Extension 5/5            THER EX  Sidelying hip abduction Left and Right 2 x 15 each.   Hip Hinge first, ( Like holding groceries and pushing car door with buttocks.  ( Get back ready to lift.) Use of dowel rod for hip hinge technique self check Deadlift  ladder with 10lb x 8 then 25 # x 8 then  45 # with good form/motion  Lift bar #45 lb with 2 15# plates.  ( Total 75#)  x 2 x 6      Reverse lunge with UE support 1 x 10 on R and L Trying with box for box squat.  Usiing 25 # KB, able to squat 80%               [facility] Adult PT Treatment/Exercise - 05/26/21 0001       Self-Care   Self-Care Other Self-Care Comments    Other Self-Care Comments  community wellness, progressive overload and need for spotter when increasing wts at local gym      Knee/Hip Exercises: Standing   Other Standing Knee Exercises goblet squat 2 x 10 30# KB      Manual Therapy   Manual Therapy Myofascial release    Manual therapy comments skilled palpation for TPDN    Myofascial Release Left QL/glutes              Trigger Point Dry Needling - 05/26/21 0001     Consent Given? Yes    Education Handout Provided Previously provided    Muscles Treated Back/Hip Quadratus lumborum;Gluteus minimus;Gluteus medius;Gluteus maximus   left only   Quadratus Lumborum Response Twitch response elicited;Palpable increased muscle length                  PT Education - 05/26/21 1322     Education Details reinforced HEP /gym equipment use for DC    [employer]) Educated Patient;Other (comment)   deaf interpreter   Methods Explanation;Demonstration;Tactile cues;Verbal cues    Comprehension Verbalized understanding;Returned demonstration              PT Short Term Goals - 04/07/21 1111       PT SHORT TERM GOAL #1   Title Patient will be independent with HEP.    Baseline independent with all given    Time 3    Period Weeks    Status Achieved    Target Date 03/23/21      PT SHORT  TERM GOAL #2   Title Pt will tolerated trial of TPDN in order to reduce R hip/knee pain by atleast 25%    Baseline On eval 6/10 on R hip   now 4/10 on tight ness  dull pain on side.    sleeping at night    Time 3    Period Weeks    Status Achieved    Target Date 03/23/21      PT SHORT TERM GOAL #3   Title Pt will be able to sleep at least 3 hours uninterrupted and be able to tolerate lying on  side for at least 30 minutes    Baseline eval unable to sleep longer than 2-3 hours and cannot tolerate lying of sides due to R/L hip pain   ( R > L) on eval.  I now turn from side  about every 90 min to 2 hours    Time 3    Period Weeks    Status Partially Met    Target Date 03/23/21               PT Long Term Goals - 05/26/21 1036       PT LONG TERM GOAL #1   Title Patient will be independent with advanced HEP    Baseline Pt has done gym equipment and has HEP    Time 6    Period Weeks    Status Achieved      PT LONG TERM GOAL #2   Title Patient will demonstrate R ER of hip to at least 45 degrees    Baseline R ER 23 degrees with hip stiffness eval  now it R ER 62 Left ER 55    Time 6    Period Weeks    Status Achieved      PT LONG TERM GOAL #3   Title Patient will demonstrate 4+/5 or greater bilateral LE MMT to improve stability during functional tasks.    Baseline See flowchart   All improved from eval    Time 6    Period Weeks    Status Achieved      PT LONG TERM GOAL #4   Title Pt will negotiate stairs with good hip stability with step over step technique    Baseline Pt using alternate stepping , no issues about hiking    Time 6    Period Weeks    Status Achieved      PT LONG TERM GOAL #5   Title Pt will report ability to sleep greater than 4 hours uninterrupted without waking due to rolling on side at night    Baseline Pt now reports sleeping 6-7 hours    Time 6    Period Weeks    Status Achieved                   Plan - 05/26/21 1020     Clinical Impression Statement Pt enters and reports left back spasm but no knee pain after moving boxes from storage last week.  Pt consented to TPDN and was closely monitored for QL/Glutes TPDN.  Pt had intial spasm of Left QL but was resolved after minimal needling and STW.  Pt was able to achieve  all LTG and is now able to continue HEP and progressive strengthening at [employer] in [place].  Pt is able to  sleep for 6-7 hours without waking at night and has increased MMT in LE's   Pt with no knee pain and  abale to negotiate steps without compensation or pain. Will DC to HEP    Personal Factors and Comorbidities Comorbidity 2;Time since onset of injury/illness/exacerbation;Age    Comorbidities Epilepsy, chronic bilateral knee pain, hearing loss, cochlear implant, gastric bypass. ( loss of 100 lbs)    Examination-Activity Limitations Locomotion Level;Transfers;Stairs;Stand;Squat    PT Frequency 2x / week    PT Duration 6 weeks    PT Treatment/Interventions ADLs/Self Care Home Management;Cryotherapy;Electrical Stimulation;Iontophoresis 72m/ml Dexamethasone;Moist Heat;Gait training;Stair training;Functional mobility training;Therapeutic activities;Therapeutic exercise;Balance training;Neuromuscular re-education;Manual techniques;Passive range of motion;Patient/family education;Vasopneumatic Device;Taping;Dry needling    PT Next Visit Plan gym work education 1-2 visits ,  Goals next visit [phone]   Consulted and Agree with Plan of Care Patient             Patient will benefit from skilled therapeutic intervention in order to improve the following deficits and impairments:  Decreased activity tolerance, Decreased balance, Abnormal gait, Difficulty walking, Decreased range of motion, Pain, Decreased strength, Increased edema, Decreased mobility  Visit Diagnosis: Chronic pain of right knee  Pain in right hip  Chronic pain of left knee  Pain in left hip  Muscle weakness (generalized)  Stiffness of right hip, not elsewhere classified  Difficulty in walking, not elsewhere classified     Problem List Patient Active Problem List   Diagnosis Date Noted   Gastroesophageal reflux disease 12/17/2020   Food intolerance 12/17/2020   Panniculitis 09/30/2020   Insect bite 06/26/2018   Urticaria 01/24/2018   Seasonal and perennial allergic rhinitis 01/24/2018    Allergic conjunctivitis 01/24/2018   Coughing 01/24/2018   Fatigue 04/05/2016   Hypersomnia with sleep apnea 04/05/2016   Snoring 04/05/2016   Hearing loss sensory, bilateral 04/05/2016   Constipation 07/14/2015   Abdominal pain 07/14/2015   Generalized idiopathic epilepsy, not intractable, without status epilepticus ([place] 02/03/2015    LVoncille Lo PT, AQuecheeCertified Exercise Expert for the Aging Adult  05/26/21 1:37 PM Phone: [phone]: 3[facility] [address]Phone: [phone]  Fax:  [phone] Name: HJACKQUELINE AQUILARMRN: [id number] of Birth: [birth year] PHYSICAL THERAPY DISCHARGE SUMMARY  Visits from Start of Care: 7  Current functional level related to goals / functional outcomes: As above   Remaining deficits: None   Education / Equipment: HEP,  Gym equipment training   Patient agrees to discharge. Patient goals were met. Patient is being discharged due to meeting the stated rehab goals.   And being pleased with current functional level LVoncille Lo PT, AChampion Medical Center - Baton RougeCertified Exercise Expert for the Aging Adult  05/26/21 1:38 PM Phone: [phone]: [phone]

## 2021-05-28 ENCOUNTER — Ambulatory Visit: Payer: Medicaid Other | Admitting: Physical Therapy

## 2021-06-02 ENCOUNTER — Ambulatory Visit: Payer: Medicaid Other | Admitting: Physical Therapy

## 2021-08-29 ENCOUNTER — Other Ambulatory Visit: Payer: Self-pay

## 2021-08-29 ENCOUNTER — Emergency Department (HOSPITAL_COMMUNITY)
Admission: EM | Admit: 2021-08-29 | Discharge: 2021-08-29 | Disposition: A | Discharge status: Home / Self Care | Payer: Medicaid Other | Attending: Emergency Medicine | Admitting: Emergency Medicine

## 2021-08-29 DIAGNOSIS — R569 Unspecified convulsions: Secondary | ICD-10-CM

## 2021-08-29 DIAGNOSIS — Z87891 Personal history of nicotine dependence: Secondary | ICD-10-CM | POA: Diagnosis not present

## 2021-08-29 DIAGNOSIS — Z20822 Contact with and (suspected) exposure to covid-19: Secondary | ICD-10-CM | POA: Insufficient documentation

## 2021-08-29 LAB — COMPREHENSIVE METABOLIC PANEL
ALT: 21 U/L (ref 0–44)
AST: 35 U/L (ref 15–41)
Albumin: 2.5 g/dL — ABNORMAL LOW (ref 3.5–5.0)
Alkaline Phosphatase: 75 U/L (ref 38–126)
Anion gap: 6 (ref 5–15)
BUN: 16 mg/dL (ref 6–20)
CO2: 25 mmol/L (ref 22–32)
Calcium: 8.3 mg/dL — ABNORMAL LOW (ref 8.9–10.3)
Chloride: 108 mmol/L (ref 98–111)
Creatinine, Ser: 0.94 mg/dL (ref 0.44–1.00)
GFR, Estimated: >60 mL/min (ref >60)
Glucose, Bld: 95 mg/dL (ref 70–99)
Potassium: 4.3 mmol/L (ref 3.5–5.1)
Sodium: 139 mmol/L (ref 135–145)
Total Bilirubin: 0.8 mg/dL (ref 0.3–1.2)
Total Protein: 5.7 g/dL — ABNORMAL LOW (ref 6.5–8.1)

## 2021-08-29 LAB — CBC WITH DIFFERENTIAL/PLATELET
Abs Immature Granulocytes: 0.02 10*3/uL (ref 0.00–0.07)
Basophils Absolute: 0 10*3/uL (ref 0.0–0.1)
Basophils Relative: 0 %
Eosinophils Absolute: 0.1 10*3/uL (ref 0.0–0.5)
Eosinophils Relative: 1 %
HCT: 43 % (ref 36.0–46.0)
Hemoglobin: 14 g/dL (ref 12.0–15.0)
Immature Granulocytes: 0 %
Lymphocytes Relative: 21 %
Lymphs Abs: 1.9 10*3/uL (ref 0.7–4.0)
MCH: 29.7 pg (ref 26.0–34.0)
MCHC: 32.6 g/dL (ref 30.0–36.0)
MCV: 91.1 fL (ref 80.0–100.0)
Monocytes Absolute: 0.6 10*3/uL (ref 0.1–1.0)
Monocytes Relative: 6 %
Neutro Abs: 6.5 10*3/uL (ref 1.7–7.7)
Neutrophils Relative %: 72 %
Platelets: 205 10*3/uL (ref 150–400)
RBC: 4.72 MIL/uL (ref 3.87–5.11)
RDW: 13 % (ref 11.5–15.5)
WBC: 9.1 10*3/uL (ref 4.0–10.5)
nRBC: 0 % (ref 0.0–0.2)

## 2021-08-29 LAB — CBG MONITORING, ED: Glucose-Capillary: 106 mg/dL — ABNORMAL HIGH (ref 70–99)

## 2021-08-29 LAB — RESP PANEL BY RT-PCR (FLU A&B, COVID) ARPGX2
Influenza A by PCR: NEGATIVE
Influenza B by PCR: NEGATIVE
SARS Coronavirus 2 by RT PCR: NEGATIVE

## 2021-08-29 LAB — URINALYSIS, ROUTINE W REFLEX MICROSCOPIC
Bilirubin Urine: NEGATIVE
Glucose, UA: NEGATIVE mg/dL
Hgb urine dipstick: NEGATIVE
Ketones, ur: NEGATIVE mg/dL
Leukocytes,Ua: NEGATIVE
Nitrite: NEGATIVE
Protein, ur: NEGATIVE mg/dL
Specific Gravity, Urine: 1.016 (ref 1.005–1.030)
pH: 5 (ref 5.0–8.0)

## 2021-08-29 LAB — POC URINE PREG, ED: Preg Test, Ur: NEGATIVE

## 2021-08-29 MED ORDER — LEVETIRACETAM IN NACL 1500 MG/100ML IV SOLN
1500.0000 mg | INTRAVENOUS | Status: AC
  Administered 2021-08-29 (×2): 1500 mg via INTRAVENOUS

## 2021-08-29 MED ORDER — MIDAZOLAM HCL 2 MG/2ML IJ SOLN
2.0000 mg | Freq: Once | INTRAMUSCULAR | Status: AC
  Administered 2021-08-29: 2 mg via INTRAVENOUS
  Filled 2021-08-29: qty 2

## 2021-08-29 NOTE — ED Triage Notes (Signed)
Pt was on [facility] visiting and began having seizures, pt came to ED, and still has generalized seizure activity that lasts about 3 sec., pt has hx of seizures and takes Keppra at home

## 2021-08-29 NOTE — Discharge Instructions (Signed)
Please continue taking your Keppra 1000 mg twice daily as prescribed.  Please call your neurologist on Monday for follow up appointment.

## 2021-08-29 NOTE — ED Provider Notes (Signed)
[facility] EMERGENCY DEPARTMENT Provider Note   CSN: [id number] Arrival date & time: 08/29/21  1515     History Chief Complaint  Patient presents with   Seizures    Mercedes Parks is a 40 y.o. female.  The history is provided by the patient and medical records.  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   Seizure [facility])     Patient Active Problem List   Diagnosis Date Noted   Gastroesophageal reflux disease 12/17/2020   Food intolerance 12/17/2020   Panniculitis 09/30/2020   Insect bite 06/26/2018   Urticaria 01/24/2018   Seasonal and perennial allergic rhinitis 01/24/2018   Allergic conjunctivitis 01/24/2018   Coughing 01/24/2018   Fatigue 04/05/2016   Hypersomnia with sleep apnea 04/05/2016   Snoring 04/05/2016   Hearing loss sensory, bilateral 04/05/2016   Constipation 07/14/2015   Abdominal pain 07/14/2015   Generalized idiopathic epilepsy, not intractable, without status epilepticus ([place]) 02/03/2015    Past Surgical History:  Procedure Laterality Date   ABDOMINAL HYSTERECTOMY  2011   CESAREAN SECTION     @ age 70   [place]     @ age 36   GASTRIC BYPASS  07/05/2016   PANNICULECTOMY  10/12/2017   Dr. Kellie Simmering   TONSILECTOMY, ADENOIDECTOMY, BILATERAL MYRINGOTOMY AND TUBES       OB History   No obstetric history on file.      Family History  Problem Relation Age of Onset   Healthy Mother    Healthy Father    Colon cancer Maternal Grandfather    Breast cancer Paternal Grandmother    Other Paternal Grandmother        Myles Lipps disease   Seizures Neg Hx     Social History   Tobacco Use   Smoking status: Former    Types: Cigarettes    Quit date: 09/27/2012    Years since quitting: 8.9   Smokeless tobacco: Never  Vaping Use   Vaping Use: Never used  Substance Use Topics   Alcohol use: No    Alcohol/week: 0.0 standard drinks    Comment: quit 11/2010   Drug use: No    Comment: marijuana; quit 3/ 2012    Home Medications Prior to Admission medications   Medication Sig Start Date End Date Taking? Authorizing Provider  escitalopram (LEXAPRO) 10 MG tablet Take 10 mg by mouth daily. 08/03/20   [provider]  estradiol (ESTRACE) 2 MG tablet Take 2 mg by mouth daily.    [provider]  famotidine (PEPCID) 40 MG tablet Take 1 tablet (40 mg total) by mouth 2 (two) times daily. 12/17/20 01/16/21  Valentina Shaggy, MD  levETIRAcetam (KEPPRA) 1000 MG tablet Take 1 tablet (1,000 mg total) by mouth 2 (two) times daily. 09/11/20   Debbora Presto, NP  Multiple Vitamins-Minerals (  OPURITY PO) Take by mouth.    [provider]  Nutritional Supplements (EQUATE PO) Take by mouth.    [provider]  pantoprazole (PROTONIX) 40 MG tablet  04/07/20   [provider]  phentermine (ADIPEX-P) 37.5 MG tablet Take 37.5 mg by mouth at bedtime. Patient not taking: Reported on 03/02/2021 08/13/20   [provider]    Allergies    Amoxicillin, Hydrocodone, Nsaids, Penicillins, and Sulfa antibiotics  Review of Systems   Review of Systems  Constitutional:  Negative for chills and fever.  HENT:  Negative for ear pain and sore throat.   Eyes:  Negative for pain and visual disturbance.  Respiratory:  Negative for cough and shortness of breath.   Cardiovascular:  Negative for  chest pain and palpitations.  Gastrointestinal:  Negative for abdominal pain and vomiting.  Genitourinary:  Negative for dysuria and hematuria.  Musculoskeletal:  Negative for arthralgias and back pain.  Skin:  Negative for color change and rash.  Neurological:  Positive for seizures. Negative for syncope.  All other systems reviewed and are negative.  Physical Exam Updated Vital Signs BP 124/81 (BP Location: Left Arm)   Pulse 68   Temp 98.3 F (36.8 C) (Oral)   Resp 18   Ht 5\' 7"  (1.702 m)   Wt 89.3 kg   SpO2 100%   BMI 30.83 kg/m   Physical Exam Vitals and nursing note reviewed.  Constitutional:      General: She is not in acute distress.    Appearance: Normal appearance. She is well-developed. She is not ill-appearing.  HENT:     Head: Normocephalic and atraumatic.     Right Ear: External ear normal.     Left Ear: External ear normal.     Nose: Nose normal. No congestion or rhinorrhea.     Mouth/Throat:     Mouth: Mucous membranes are moist.     Pharynx: Oropharynx is clear.  Eyes:     Extraocular Movements: Extraocular movements intact.     Conjunctiva/sclera: Conjunctivae normal.     Pupils: Pupils are equal, round, and reactive to light.  Cardiovascular:     Rate and Rhythm: Normal rate and regular rhythm.     Pulses: Normal pulses.     Heart sounds: No murmur heard. Pulmonary:     Effort: Pulmonary effort is normal. No respiratory distress.     Breath sounds: Normal breath sounds. No wheezing, rhonchi or rales.  Abdominal:     General: Abdomen is flat. Bowel sounds are normal.     Palpations: Abdomen is soft.     Tenderness: There is no abdominal tenderness. There is no guarding or rebound.  Musculoskeletal:        General: No swelling, tenderness or deformity.     Cervical back: Normal range of motion and neck supple. No rigidity.  Skin:    General: Skin is warm and dry.     Capillary Refill: Capillary refill takes less than 2 seconds.  Neurological:      General: No focal deficit present.     Mental Status: She is alert and oriented to person, place, and time.     Cranial Nerves: No cranial nerve deficit.     Sensory: No sensory deficit.     Motor: No weakness.     Coordination: Coordination normal.     Gait: Gait normal.     Comments: Intermittent periods of eye fluttering followed by partial unresponsiveness and stiffening of her upper extremities.  Lasted for several seconds and spontaneously resolved with immediate return to baseline.  Psychiatric:        Mood and Affect: Mood normal.    ED Results / Procedures / Treatments   Labs (all labs ordered are listed, but only abnormal results are displayed) Labs Reviewed  COMPREHENSIVE METABOLIC PANEL - Abnormal; Notable for the following components:      Result Value   Calcium 8.3 (*)    Total Protein 5.7 (*)    Albumin 2.5 (*)    All other components within normal limits  CBG MONITORING, ED - Abnormal; Notable for the following components:   Glucose-Capillary 106 (*)    All other components within normal limits  RESP PANEL BY RT-PCR (FLU A&B, COVID) ARPGX2  CBC WITH DIFFERENTIAL/PLATELET  URINALYSIS, ROUTINE W REFLEX MICROSCOPIC  LEVETIRACETAM LEVEL  POC URINE PREG, ED    EKG EKG Interpretation  Date/Time:  Saturday August 29 2021 15:41:56 EST Ventricular Rate:  74 PR Interval:  160 QRS Duration: 92 QT Interval:  397 QTC Calculation: 441 R Axis:   71 Text Interpretation: Sinus rhythm Low voltage, precordial leads NO prior ECG for comparison. No STEMI Confirmed by Antony Blackbird [phone]) on 08/29/2021 3:43:50 PM  Radiology No results found.  Procedures Procedures   Medications Ordered in ED Medications  midazolam (VERSED) injection 2 mg (2 mg Intravenous Given 08/29/21 1619)  levETIRAcetam (KEPPRA) IVPB 1500 mg/ 100 mL premix (0 mg Intravenous Stopped 08/29/21 1915)    ED Course  I have reviewed the triage vital signs and the nursing notes.  Pertinent labs &  imaging results that were available during my care of the patient were reviewed by me and considered in my medical decision making (see chart for details).    MDM Rules/Calculators/A&P                          40 year old female with history of deafness and cochlear implant, known seizure disorder on Keppra, who is presenting with seizure-like activity.  Exam as above.  She is afebrile hemodynamically stable.  She has recurrent episodes of eye fluttering and generalized upper extremity shaking in the ED.  She was given 2 mg of Versed and a keppra load. POC glucose normal.  Basic labs obtained.  Electrolytes reassuring.  Creatinine at baseline.  White count normal.  UA showed no signs of acute infection.  UPT negative.  Low concern for infectious or metabolic etiology.  COVID and flu negative.  Patient does admit to increased seizure frequency when she is stressed and she has been concerned about a family member who is in the hospital.  She has had no recurrence of seizure activity since receiving the Keppra load.  There is a Keppra level that is pending.  She is tolerating p.o.  Repeat neuro exam unchanged.  She is appropriate for discharge home with close follow-up with neurology early next week.  Strict return to ED precautions provided.  Final Clinical Impression(s) / ED Diagnoses Final diagnoses:  Seizure-like activity [facility])    Rx / [place] Orders ED Discharge Orders     None        Idamae Lusher, MD 08/29/21 2118    Tegeler, Gwenyth Allegra, MD 08/31/21 [phone]

## 2021-08-29 NOTE — ED Notes (Signed)
Pt explains that shes been having seizures since October of this year and they are usually brought on by stress or alcohol use. She denies any of these triggers today. She usually feels sleepy after seizures. She has been taking her kepra regularly and the only new med she has started is prozac about a month ago. She says her seizure episodes usually last for an hour or longer.

## 2021-08-31 ENCOUNTER — Telehealth: Payer: Self-pay | Admitting: Family Medicine

## 2021-08-31 NOTE — Telephone Encounter (Signed)
Called pt back. Scheduled sooner appt for 09/02/21 at 9am w/ AL,NP. Asked she check in by 830a, bring updated insurance cards and med list.   I called sign language interpreter line and requested intepreter for appt. I left voicemail.

## 2021-08-31 NOTE — Telephone Encounter (Signed)
Pt called states she had a seizure on Saturday while at the hospital. States if lasted for an hour with 5 min increments. Pt requesting a call back.

## 2021-09-01 NOTE — Patient Instructions (Signed)
Below is our plan:  We will switch you to an extended release version. Please start levetiracetam ER 2000mg  (take 4 500mg  tablets).   Please make sure you are staying well hydrated. I recommend 50-60 ounces daily. Well balanced diet and regular exercise encouraged. Consistent sleep schedule with 6-8 hours recommended.   Please continue follow up with care team as directed.   Follow up with Dr Leta Baptist in 3-4  You may receive a survey regarding today's visit. I encourage you to leave honest feed back as I do use this information to improve patient care. Thank you for seeing me today!

## 2021-09-01 NOTE — Progress Notes (Signed)
Chief Complaint  Patient presents with   Follow-up    Rm 1, w wife. Here for hospital f/u for sz. Seen on 12/3 for sz like activity. Pt reports being tired and having HA. Pt has a lot of trigger warning. Pt reports back in Oct pt had light seizure on her wedding day.     HISTORY OF PRESENT ILLNESS: 09/02/21 ALL: Mercedes Parks returns today for follow up for seizures. She was last seen 08/2020 and had reported doing well until breakthrough seizure 08/25/2020 in setting of missed AED doses and increased stress. Following visit she was doing well. She was asked to follow up in 6 months. Appt 02/2021 with me was rescheduled to Dr Leta Baptist 03/2021 in my absence. She cancelled three appts with Dr Leta Baptist 7/5, 8/16 and 05/13/21.   She called 08/31/21 reporting breakthrough seizure occurring 08/29/2021. She was visiting a friend in the hospital and noted to have about 6 seizures. She had eye deviation and upper extremity shaking. She was treated with versed and IV loading dose of levetiracetam. ER note reports levetiracetam level pending but I am unable to locate in results. No seizure activity in the hospital following IV levetiracetam.   She continues levetiracetam 1000mg  BID. She reports two "very light" seizures in October (on her wedding day). One event before the wedding and one following ceremony. She notified her friend that she was about to pass out. Friend pulled into a store and she "blacked out". Unclear if she had GTC She was very stressed and tired. She has not been sleeping well. She does admit that she had missed 5-8 doses of her medication in October. She has had several events since 12/3. She reports multiple events of talking "nonsense" or signing to herself. Events can occur up to 20 times in a day. She is usually back to baseline following event. Last event occurred after an argument with her wife. She has been drinking much more caffeine than normal. Multiple energy drinks and coffee daily.  Some alcohol use, unclear specific amounts but wife denies regular or excessive use. She is taking melatonin that helps some. She started fluoxetine about a month ago managed by psychiatry.    09/11/2020 ALL:  Mercedes Parks is a 40 y.o. female here today for follow up for seizures. She continues levetiracetam 1000mg  BID. She reports that she had done very well up until recently.  She admits to significantly more stress at home.  Her son is diagnosed with ADHD.  She had taken a trip to [place] with her family where she had a generalized seizure in the setting of about 9 missed doses of levetiracetam.  Patient shares a video of seizure activity in the office which demonstrates that she was unresponsive with her head turned to the right.  Generalized tonic-clonic movement noted of upper extremities, unable to visualize lower extremities.  Patient reports that symptoms persisted for a total of 30 minutes.  She was given Ativan in route to the ER.  She was seen by the ER in [place].  Work-up was unremarkable with the exception of low AED level.  CT scan normal.  She has resumed medication twice daily.  She feels that she is able to make accommodations in her schedule to ensure that she takes medication as prescribed.  She is hesitant to switch to an extended release formulation.   HISTORY (copied from previous note)  Mercedes Parks is a 40 y.o. female here today for follow up for seizure.  She continues levetiracetam 1000mg  twice daily. She is tolerating medication well with no obvious adverse effects. No seizure activity noted since last visit.      History (copied from Dr Nikki Dom note on 05/15/2017)   UPDATE (08/12/17, VRP): Since last visit, doing well. Tolerating meds. No alleviating or aggravating factors. No more seizures. Does note some vivid dreams (wakes up and feels like something has touched her)   UPDATE 05/10/17: Since last visit, was doing well, and had gastric bypass surgery (Oct  2017). Then on 04/25/17, was drinking etoh (~20 oz, red's applehad not done so in a while), missed evening keppra doses in July 2018. Then had multiple breakthrough seizures outside (2-3 minutes each; 4-5 sz), then came inside with help of friends, and continued to have more seizures (another 45-60 minutes)> EMS arrived on scene. They were able to "stop" a few seizures with sternal rub. EMT raised possibility of pseudoseizures. Patient went to ER and was observed few hours and then d/c home. Records reviewed. Urine preg test neg. Labs neg. Patient was confused, tired, sore. She thinks she hit her head.    UPDATE 02/11/16: Since last visit, doing well. No seizures. Had a strange dream 2-3 weeks ago, that felt like possible seizure, then she woke up gasping for breath. Denies snoring or witnessed apnea, but lives alone. Has some daytime sleepiness even after full nights sleep.   UPDATE 02/03/15 (VRP): Since last visit, doing well. No seizures. Tolerating medications. Some more anxiety, now on cymbalta, and feels better.   UPDATE 08/05/14 (LL): Since last visit, patient has been well, no seizures. She is assisted by sign-language interpreter today. Tolerating Levitiracetam 1000 mg BID well.  Has been having some increased blurred vision and occasional diplopia, recent eye exam was normal with the exception of a change on her eyeglass Rx. She is worried about memory loss, having trouble with remembering appointments and things that she and her son have scheduled. Has lost 40 lb. In the last year.    UPDATE 01/30/14 (LL):  Mercedes Parks comes in for routine follow up, no seizures since last visit.  She had total hysterectomy in December due to endometriosis.  Tolerating Levitiracetam 1000 mg BID with only occasional dizziness.  She reports trouble staying asleep, wakes usually 2-3 times. Doing very well.  Asks about "vaping" and if it may trigger seizure.   UPDATE 07/31/13: On 07/27/13 patient was at a [place] party, had  several alcoholic drinks, was exposed to secondhand marijuana smoke, and had multiple back-to-back seizures. Apparently she had several minutes of convulsions followed by several minutes of remission. Seizures started and stopped multiple times. Ambulance was called and arrived to pick her up within 45 minutes of onset of symptoms. The symptoms continued while patient was at the hospital. She was given benzodiazepine in the ambulance. She was given 1000 mg Keppra IV at the hospital. She was continued on the same dose of antiseizure medication (levetiracetam 500 mg twice a day). Patient tells that she has been missing some doses of antiseizure medication. She misses 4-8 tablets per month. This usually happens when she falls asleep after school and misses her dose.    UPDATE 11/20/12: She is doing very well, there was no recurrent seizure, she is tolerating Keppra 500 mg twice a day.    UPDATE 02/02/12: No sz. More balance troubles, weight gain, decr energy, and fatigue. Drinks lots of "energy drinks". Having trouble with stairs (trips and falls).    UPDATE 07/06/11: Doing well. No  sz. Back to driving. Now with endometriosis and planning to start lupron shots, but concerned about warning on package about epilepsy.    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 He is accompanied by her mother, father and sign language interpreter.  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 Age 62 yrs - cochlear implants. Oct 2011 - dx'd with endometriosis; treated with tramadol; then had an episode of lightheadedness, out of body sensation, then passed out; reportedly had convulsions and tongue biting, no incontinence; then slept for 2 hours. Feb 2012 - exposure to synthetic marijuana, then had similar episode of seizure. March 2012 - several episodes of night time awakening, "weird breathing", not knowing if she  is awake or dreaming; EEG done at Dr. 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  April 2012 - started on LEV 500mg  BID    REVIEW OF SYSTEMS: Out of a complete 14 system review of symptoms, the patient complains only of the following symptoms, seizures, depression, anxiety, hearing impaired and all other reviewed systems are negative.   ALLERGIES: Allergies  Allergen Reactions   Amoxicillin Hives   Hydrocodone Nausea And Vomiting   Nsaids     Gastric Bypass Surgery   Penicillins Hives    Has patient had a PCN reaction causing immediate rash, facial/tongue/throat swelling, SOB or lightheadedness with hypotension: Yes Has patient had a PCN reaction causing severe rash involving mucus membranes or skin necrosis: No Has patient had a PCN reaction that required hospitalization No Has patient had a PCN reaction occurring within the last 10 years: Non If all of the above answers are "NO", then may proceed with Cephalosporin use.    Sulfa Antibiotics Hives     HOME MEDICATIONS: Outpatient Medications Prior to Visit  Medication Sig Dispense Refill   estradiol (ESTRACE) 2 MG tablet Take 2 mg by mouth daily.     FLUoxetine (PROZAC) 10 MG capsule Take 10 mg by mouth daily.     Multiple Vitamins-Minerals (OPURITY PO) Take by mouth.     Nutritional Supplements (EQUATE PO) Take by mouth.     pantoprazole (PROTONIX) 40 MG tablet      TRULANCE 3 MG TABS Take 1 tablet by mouth daily.     levETIRAcetam (KEPPRA) 1000 MG tablet Take 1 tablet (1,000 mg total) by mouth 2 (two) times daily. 180 tablet 3   escitalopram (LEXAPRO) 10 MG tablet Take 10 mg by mouth daily.     famotidine (PEPCID) 40 MG tablet Take 1 tablet (40 mg total) by mouth 2 (two) times daily. 60 tablet 3   phentermine (ADIPEX-P) 37.5 MG tablet Take 37.5 mg by mouth at bedtime. (Patient not taking: Reported on 03/02/2021)     No facility-administered medications prior to visit.     PAST MEDICAL  HISTORY: Past Medical History:  Diagnosis Date   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   Seizure [facility])      PAST SURGICAL HISTORY: Past Surgical History:  Procedure Laterality Date   ABDOMINAL HYSTERECTOMY  2011   CESAREAN SECTION     @ age 61   CHOLECYSTECTOMY     COCHLEAR IMPLANT     @ age 39   COCHLEAR IMPLANT  12/2020   GASTRIC BYPASS  07/05/2016   PANNICULECTOMY  10/12/2017   Dr. Kellie Simmering   TONSILECTOMY, ADENOIDECTOMY, [place]  FAMILY HISTORY: Family History  Problem Relation Age of Onset   Healthy Mother    Healthy Father    Colon cancer Maternal Grandfather    Breast cancer Paternal Grandmother    Other Paternal Grandmother        Myles Lipps disease   Seizures Neg Hx      SOCIAL HISTORY: Social History   Socioeconomic History   Marital status: Single    Spouse name: Not on file   Number of children: 1   Years of education: College   Highest education level: Not on file  Occupational History    Comment: n/a-unemployed  Tobacco Use   Smoking status: Former    Types: Cigarettes    Quit date: 09/27/2012    Years since quitting: 8.9   Smokeless tobacco: Never  Vaping Use   Vaping Use: Never used  Substance and Sexual Activity   Alcohol use: No    Alcohol/week: 0.0 standard drinks    Comment: quit 11/2010   Drug use: No    Comment: marijuana; quit 3/ 2012   Sexual activity: Not on file  Other Topics Concern   Not on file  Social History Narrative   Patient is single with one child.   Patient is right handed.   Patient has hs education and is currently in college.   Patient does not drink any caffeine.   Social Determinants of Health   Financial Resource Strain: Not on file  Food Insecurity: Not on file  Transportation Needs: Not on file  Physical Activity: Not on file  Stress: Not on file  Social Connections: Not on file  Intimate Partner Violence: Not on file      PHYSICAL EXAM  Vitals:   09/02/21  0906  BP: 102/70  Pulse: 78  SpO2: 98%  Weight: 221 lb (100.2 kg)  Height: 5\' 7"  (1.702 m)    Body mass index is 34.61 kg/m.   Generalized: Well developed, in no acute distress  Cardiology: normal rate and rhythm, no murmur auscultated  Respiratory: clear to auscultation bilaterally    Neurological examination  Mentation: Alert oriented to time, place, history taking. Follows all commands speech and language fluent Cranial nerve II-XII: Pupils were equal round reactive to light. Extraocular movements were full, visual field were full on confrontational test. Facial sensation and strength were normal. Head turning and shoulder shrug  were normal and symmetric. Motor: The motor testing reveals 5 over 5 strength of all 4 extremities. Good symmetric motor tone is noted throughout.  Sensory: Sensory testing is intact to soft touch on all 4 extremities. No evidence of extinction is noted.  Coordination: Cerebellar testing reveals good finger-nose-finger and heel-to-shin bilaterally.  Gait and station: Gait is normal.  Reflexes: Deep tendon reflexes are symmetric and normal bilaterally.     DIAGNOSTIC DATA (LABS, IMAGING, TESTING) - I reviewed patient records, labs, notes, testing and imaging myself where available.  Lab Results  Component Value Date   WBC 9.1 08/29/2021   HGB 14.0 08/29/2021   HCT 43.0 08/29/2021   MCV 91.1 08/29/2021   PLT 205 08/29/2021      Component Value Date/Time   NA 139 08/29/2021 1547   NA 142 05/15/2018 1438   K 4.3 08/29/2021 1547   CL 108 08/29/2021 1547   CO2 25 08/29/2021 1547   GLUCOSE 95 08/29/2021 1547   BUN 16 08/29/2021 1547   BUN 14 05/15/2018 1438   CREATININE 0.94 08/29/2021 1547   CALCIUM 8.3 (L)  08/29/2021 1547   PROT 5.7 (L) 08/29/2021 1547   PROT 6.8 05/15/2018 1438   ALBUMIN 2.5 (L) 08/29/2021 1547   ALBUMIN 3.9 05/15/2018 1438   AST 35 08/29/2021 1547   ALT 21 08/29/2021 1547   ALKPHOS 75 08/29/2021 1547   BILITOT 0.8  08/29/2021 1547   BILITOT 0.3 05/15/2018 1438   GFRNONAA >60 08/29/2021 1547   GFRAA 110 05/15/2018 1438   No results found for: CHOL, HDL, LDLCALC, LDLDIRECT, TRIG, CHOLHDL Lab Results  Component Value Date   HGBA1C 5.0 05/15/2018   Lab Results  Component Value Date   VITAMINB12 680 05/15/2018   Lab Results  Component Value Date   TSH 3.430 05/15/2018    No flowsheet data found.   ASSESSMENT AND PLAN  40 y.o. year old female  has a past medical history of Hearing loss and Seizure ([place]). here with   Generalized idiopathic epilepsy, not intractable, without status epilepticus ([place]) - Plan: EEG adult, Levetiracetam level  Anxiety and depression  Excessive caffeine intake  Akili has had several events over the past 2 months concerning for possible seizure activity. Event in hospital 12/3 was witness and consistent with GTC seizures. Some events described by patient and her wife, specifically in setting of stressful events/arguments may be more consistent with non epileptic events. Neuro exam intact. I will repeat EEG. I have discussed with Courtney and her wife the importance of medication compliance. I will switch her to levetiracetam ER 2000mg  daily. I am hopeful this will help with limiting missed doses. I have strongly encouraged her to avoid excessive caffeine and alcohol usage. She was encouraged to reach out to her psychiatrists for concerns of anxiety and depression. Healthy lifestyle habits reviewed.  She was reminded of [place] law not to drive for 6 months.  She will follow-up with Dr Leta Baptist as already scheduled 10/05/2020. She verbalizes understanding and agreement with this plan.  Orders Placed This Encounter  Procedures   Levetiracetam level   EEG adult    Standing Status:   Future    Standing Expiration Date:   09/02/2022    Order Specific Question:   Where should this test be performed?    Answer:   [facility]    Order Specific Question:   Reason for exam     Answer:   Seizure     Samika Vetsch, MSN, FNP-C 09/02/2021, 12:50 PM  [facility] [address], [place], [place] [address] [phone]

## 2021-09-02 ENCOUNTER — Ambulatory Visit: Payer: Medicaid Other | Admitting: Family Medicine

## 2021-09-02 ENCOUNTER — Encounter: Payer: Self-pay | Admitting: Family Medicine

## 2021-09-02 VITALS — BP 102/70 | HR 78 | Ht 67.0 in | Wt 221.0 lb

## 2021-09-02 DIAGNOSIS — Z789 Other specified health status: Secondary | ICD-10-CM | POA: Diagnosis not present

## 2021-09-02 DIAGNOSIS — F32A Depression, unspecified: Secondary | ICD-10-CM | POA: Diagnosis not present

## 2021-09-02 DIAGNOSIS — G40309 Generalized idiopathic epilepsy and epileptic syndromes, not intractable, without status epilepticus: Secondary | ICD-10-CM | POA: Diagnosis not present

## 2021-09-02 DIAGNOSIS — F419 Anxiety disorder, unspecified: Secondary | ICD-10-CM | POA: Diagnosis not present

## 2021-09-02 MED ORDER — LEVETIRACETAM ER 500 MG PO TB24: 2000.0000 mg | ORAL_TABLET | Freq: Every day | ORAL | 5 refills | Status: DC

## 2021-09-09 ENCOUNTER — Ambulatory Visit: Payer: Medicaid Other | Admitting: Diagnostic Neuroimaging

## 2021-09-09 DIAGNOSIS — G40309 Generalized idiopathic epilepsy and epileptic syndromes, not intractable, without status epilepticus: Secondary | ICD-10-CM

## 2021-09-16 ENCOUNTER — Encounter: Payer: Self-pay | Admitting: Family Medicine

## 2021-09-16 ENCOUNTER — Telehealth: Payer: Self-pay | Admitting: Family Medicine

## 2021-09-16 NOTE — Telephone Encounter (Signed)
See my chart message

## 2021-09-16 NOTE — Telephone Encounter (Signed)
Called and LVM asking pt  call office back to discuss lab missed/medication.

## 2021-09-16 NOTE — Telephone Encounter (Signed)
Can you guys help me track down her levetiracetam results? We had her update in the office 12/7. Were they drawn? TY!

## 2021-09-16 NOTE — Telephone Encounter (Signed)
Amy- I just checked with [facility] in the lab. She did not have labs drawn. I can call her to come by to complete. Did you want her to come first thing in the morning?

## 2021-10-01 NOTE — Procedures (Signed)
° °  [facility]  EEG (ELECTROENCEPHALOGRAM) REPORT   STUDY DATE: 09/09/21 PATIENT NAME: Mercedes Parks DOB: September 01, 1981 MRN: [id number]  ORDERING CLINICIAN: Debbora Presto, NP   TECHNOLOGIST: Myer Peer TECHNIQUE: Electroencephalogram was recorded utilizing standard 10-20 system of lead placement and reformatted into average and bipolar montages.  RECORDING TIME: 23 minutes  ACTIVATION: hyperventilation and photic stimulation  CLINICAL INFORMATION: 41 year old female with seizures.  FINDINGS: Posterior dominant background rhythms, which attenuate with eye opening, ranging 8-9 hertz and 50-60 microvolts. No focal, lateralizing, epileptiform activity or seizures are seen. Patient recorded in the awake and drowsy state. EKG channel shows regular rhythm of 60-70 beats per minute.   IMPRESSION:   Normal EEG in the awake and drowsy states.   INTERPRETING PHYSICIAN:  Penni Bombard, MD Certified in Neurology, Neurophysiology and Neuroimaging  [facility] [address] [place], [place] [address] [phone]

## 2021-10-05 ENCOUNTER — Ambulatory Visit: Payer: Medicaid Other | Admitting: Diagnostic Neuroimaging

## 2021-10-05 ENCOUNTER — Encounter: Payer: Self-pay | Admitting: Diagnostic Neuroimaging

## 2021-10-05 VITALS — BP 99/71 | HR 60 | Ht 67.0 in | Wt 230.0 lb

## 2021-10-05 DIAGNOSIS — G40309 Generalized idiopathic epilepsy and epileptic syndromes, not intractable, without status epilepticus: Secondary | ICD-10-CM | POA: Diagnosis not present

## 2021-10-05 DIAGNOSIS — F419 Anxiety disorder, unspecified: Secondary | ICD-10-CM

## 2021-10-05 DIAGNOSIS — F32A Depression, unspecified: Secondary | ICD-10-CM

## 2021-10-05 NOTE — Patient Instructions (Addendum)
°  SEIZURE DISORDER (last event possibly 09/21/21; triggers include stress, sleep deprivation)  - continue levetiracetam XR 500mg  x 4 tabs in AM for now  - refer to [facility] (epilepsy monitoring unit) for spell characterization  - consider lamotrigine or topiramate in future  - no driving until seizure free x 6 months

## 2021-10-05 NOTE — Progress Notes (Signed)
PATIENT: Mercedes Parks DOB: January 12, 1981  REASON FOR VISIT: routine follow up for seizures HISTORY FROM: patient and wife (via ASL interpreter)  Chief Complaint  Patient presents with   Epilepsy    Rm 7, wife- Nicki, interpreter- [name]  "completed EEG, was normal, do I need to do 3 day EEG at home; 09/21/21 maybe a light seizure witnessed by Nicki"     HISTORY OF PRESENT ILLNESS:  UPDATE (10/05/21, VRP): Here for eval of recurrent events. Had seizure like spells on 08/29/21 (shaking, eye fluttering, went to ER) and at home 09/21/21 (brief zoneout, was able to snap out of with tap on shoulder). Difficulty with sleep. Tolerating meds.   UPDATE (08/12/17, VRP): Since last visit, doing well. Tolerating meds. No alleviating or aggravating factors. No more seizures. Does note some vivid dreams (wakes up and feels like something has touched her)  UPDATE 05/10/17: Since last visit, was doing well, and had gastric bypass surgery (Oct 2017). Then on 04/25/17, was drinking etoh (~20 oz, red's applehad not done so in a while), missed evening keppra doses in July 2018. Then had multiple breakthrough seizures outside (2-3 minutes each; 4-5 sz), then came inside with help of friends, and continued to have more seizures (another 45-60 minutes)> EMS arrived on scene. They were able to "stop" a few seizures with sternal rub. EMT raised possibility of pseudoseizures. Patient went to ER and was observed few hours and then d/c home. Records reviewed. Urine preg test neg. Labs neg. Patient was confused, tired, sore. She thinks she hit her head.   UPDATE 02/11/16: Since last visit, doing well. No seizures. Had a strange dream 2-3 weeks ago, that felt like possible seizure, then she woke up gasping for breath. Denies snoring or witnessed apnea, but lives alone. Has some daytime sleepiness even after full nights sleep.  UPDATE 02/03/15 (VRP): Since last visit, doing well. No seizures. Tolerating medications. Some  more anxiety, now on cymbalta, and feels better.  UPDATE 08/05/14 (LL): Since last visit, patient has been well, no seizures. She is assisted by sign-language interpreter today. Tolerating Levitiracetam 1000 mg BID well.  Has been having some increased blurred vision and occasional diplopia, recent eye exam was normal with the exception of a change on her eyeglass Rx. She is worried about memory loss, having trouble with remembering appointments and things that she and her son have scheduled. Has lost 40 lb. In the last year.   UPDATE 01/30/14 (LL):  Doninique comes in for routine follow up, no seizures since last visit.  She had total hysterectomy in December due to endometriosis.  Tolerating Levitiracetam 1000 mg BID with only occasional dizziness.  She reports trouble staying asleep, wakes usually 2-3 times. Doing very well.  Asks about "vaping" and if it may trigger seizure.  UPDATE 07/31/13: On 07/27/13 patient was at a [place] party, had several alcoholic drinks, was exposed to secondhand marijuana smoke, and had multiple back-to-back seizures. Apparently she had several minutes of convulsions followed by several minutes of remission. Seizures started and stopped multiple times. Ambulance was called and arrived to pick her up within 45 minutes of onset of symptoms. The symptoms continued while patient was at the hospital. She was given benzodiazepine in the ambulance. She was given 1000 mg Keppra IV at the hospital. She was continued on the same dose of antiseizure medication (levetiracetam 500 mg twice a day). Patient tells that she has been missing some doses of antiseizure medication. She misses 4-8 tablets  per month. This usually happens when she falls asleep after school and misses her dose.   UPDATE 11/20/12: She is doing very well, there was no recurrent seizure, she is tolerating Keppra 500 mg twice a day.    UPDATE 02/02/12: No sz. More balance troubles, weight gain, decr energy, and fatigue.  Drinks lots of "energy drinks". Having trouble with stairs (trips and falls).   UPDATE 07/06/11: Doing well. No sz. Back to driving. Now with endometriosis and planning to start lupron shots, but concerned about warning on package about epilepsy.    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 He is accompanied by her mother, father and sign language interpreter.  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 Age 89 yrs - cochlear implants. Oct 2011 - dx'd with endometriosis; treated with tramadol; then had an episode of lightheadedness, out of body sensation, then passed out; reportedly had convulsions and tongue biting, no incontinence; then slept for 2 hours. Feb 2012 - exposure to synthetic marijuana, then had similar episode of seizure. March 2012 - several episodes of night time awakening, "weird breathing", not knowing if she is awake or dreaming; EEG done at Dr. 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  April 2012 - started on LEV 500mg  BID    REVIEW OF SYSTEMS: Full 14 system review of systems performed and negative except: depression anxiety dizziness headache seizure light sens double vision    ALLERGIES: Allergies  Allergen Reactions   Amoxicillin Hives   Hydrocodone Nausea And Vomiting   Nsaids     Gastric Bypass Surgery   Penicillins Hives    Has patient had a PCN reaction causing immediate rash, facial/tongue/throat swelling, SOB or lightheadedness with hypotension: Yes Has patient had a PCN reaction causing severe rash involving mucus membranes or skin necrosis: No Has patient had a PCN reaction that required hospitalization No Has patient had a PCN reaction occurring within the last 10 years: Non If all of the above answers are "NO", then may proceed with Cephalosporin use.    Sulfa Antibiotics Hives    HOME  MEDICATIONS: Outpatient Medications Prior to Visit  Medication Sig Dispense Refill   escitalopram (LEXAPRO) 10 MG tablet 10 mg daily.     estradiol (ESTRACE) 2 MG tablet Take 2 mg by mouth daily.     hydrOXYzine (VISTARIL) 25 MG capsule Take 25 mg by mouth as needed. 1 as needed for anxiety     levETIRAcetam (KEPPRA XR) 500 MG 24 hr tablet Take 4 tablets (2,000 mg total) by mouth daily. 120 tablet 5   pantoprazole (PROTONIX) 40 MG tablet      TRULANCE 3 MG TABS Take 1 tablet by mouth daily.     UNABLE TO FIND Med Name: Petra Kuba made ES Vit D3 5000 IU     UNABLE TO FIND Med Name: Vital fusion fiberwell gummies 1 x daily     UNABLE TO FIND Med Name:Equate MVI Adults 50+, 1 daily     FLUoxetine (PROZAC) 10 MG capsule Take 10 mg by mouth daily.     Multiple Vitamins-Minerals (OPURITY PO) Take by mouth.     Nutritional Supplements (EQUATE PO) Take by mouth.     No facility-administered medications prior to visit.   Family History  Problem Relation Age of Onset   Healthy Mother    Healthy Father    Colon cancer Maternal Grandfather  Breast cancer Paternal Grandmother    Other Paternal Grandmother        Myles Lipps disease   Seizures Neg Hx    Past Medical History:  Diagnosis Date   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   Seizure [facility])    Past Surgical History:  Procedure Laterality Date   ABDOMINAL HYSTERECTOMY  2011   CESAREAN SECTION     @ age 59   [place]     @ age 61   COCHLEAR IMPLANT  12/2020   GASTRIC BYPASS  07/05/2016   PANNICULECTOMY  10/12/2017   Dr. Kellie Simmering   TONSILECTOMY, ADENOIDECTOMY, BILATERAL MYRINGOTOMY AND TUBES     Social History   Socioeconomic History   Marital status: Married    Spouse name: Nicki   Number of children: 1   Years of education: College   Highest education level: Not on file  Occupational History    Comment: n/a-unemployed  Tobacco Use   Smoking status: Former    Types: Cigarettes    Quit date:  09/27/2012    Years since quitting: 9.0   Smokeless tobacco: Never  Vaping Use   Vaping Use: Never used  Substance and Sexual Activity   Alcohol use: No    Alcohol/week: 0.0 standard drinks    Comment: quit 11/2010   Drug use: No    Comment: marijuana; quit 3/ 2012   Sexual activity: Not on file  Other Topics Concern   Not on file  Social History Narrative   Patient lives with wife.   Patient is right handed.   Patient has hs education and is currently in college.   Patient does not drink any caffeine.   Social Determinants of Health   Financial Resource Strain: Not on file  Food Insecurity: Not on file  Transportation Needs: Not on file  Physical Activity: Not on file  Stress: Not on file  Social Connections: Not on file  Intimate Partner Violence: Not on file     PHYSICAL EXAM  GENERAL EXAM/CONSTITUTIONAL: Vitals:  Vitals:   10/05/21 0938  BP: 99/71  Pulse: 60  Weight: 230 lb (104.3 kg)  Height: 5\' 7"  (1.702 m)   Body mass index is 36.02 kg/m. Wt Readings from Last 6 Encounters:  10/05/21 230 lb (104.3 kg)  09/02/21 221 lb (100.2 kg)  08/29/21 196 lb 13.9 oz (89.3 kg)  12/17/20 196 lb 12.8 oz (89.3 kg)  09/11/20 200 lb (90.7 kg)  09/05/20 201 lb (91.2 kg)   Patient is in no distress; well developed, nourished and groomed; neck is supple  CARDIOVASCULAR: Examination of carotid arteries is normal; no carotid bruits Regular rate and rhythm, no murmurs Examination of peripheral vascular system by observation and palpation is normal  EYES: Ophthalmoscopic exam of optic discs and posterior segments is normal; no papilledema or hemorrhages No results found.  MUSCULOSKELETAL: Gait, strength, tone, movements noted in Neurologic exam below  NEUROLOGIC: MENTAL STATUS:  No flowsheet data found. awake, alert, oriented to person, place and time recent and remote memory intact normal attention and concentration language fluent, comprehension intact, naming  intact fund of knowledge appropriate VIA INTERPRETER  CRANIAL NERVE:  2nd - no papilledema on fundoscopic exam 2nd, 3rd, 4th, 6th - pupils equal and reactive to light, visual fields full to confrontation, extraocular muscles intact, no nystagmus 5th - facial sensation symmetric 7th - facial strength symmetric 8th - hearing --> IMPAIRED; USES SIGN LANGUAGE 9th -  palate elevates symmetrically, uvula midline 11th - shoulder shrug symmetric 12th - tongue protrusion midline  MOTOR:  normal bulk and tone, full strength in the BUE, BLE  SENSORY:  normal and symmetric to light touch, pinprick, temperature, vibration; EXCEPT DECR IN RIGHT THIGH TO TEMP AND PP  COORDINATION:  finger-nose-finger, fine finger movements normal  REFLEXES:  deep tendon reflexes present and symmetric  GAIT/STATION:  narrow based gait   DIAGNOSTICS/IMAGING:  Lab Results  Component Value Date   WBC 9.1 08/29/2021   HGB 14.0 08/29/2021   HCT 43.0 08/29/2021   MCV 91.1 08/29/2021   PLT 205 08/29/2021   CMP Latest Ref Rng & Units 08/29/2021 05/15/2018  Glucose 70 - 99 mg/dL 95 87  BUN 6 - 20 mg/dL 16 14  Creatinine 0.44 - 1.00 mg/dL 0.94 0.80  Sodium 135 - 145 mmol/L 139 142  Potassium 3.5 - 5.1 mmol/L 4.3 4.6  Chloride 98 - 111 mmol/L 108 99  CO2 22 - 32 mmol/L 25 27  Calcium 8.9 - 10.3 mg/dL 8.3(L) 9.3  Total Protein 6.5 - 8.1 g/dL 5.7(L) 6.8  Total Bilirubin 0.3 - 1.2 mg/dL 0.8 0.3  Alkaline Phos 38 - 126 U/L 75 89  AST 15 - 41 U/L 35 19  ALT 0 - 44 U/L 21 15    08/09/14 CT head - nothing acute  05/19/17 CT head  - Unremarkable. No acute findings.   05/18/17 EEG  - normal  09/09/21 EEG - normal  09/23/21 LEV 5.8 L (10-40)   ASSESSMENT: 40 y.o. with multiple episodes of loss of consciousness, convulsions, tongue biting, also with out of body experience and aura prior to the spells. 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 Outside EEG reportedly showed  signs of primary generalized epilepsy.   Last seizure 07/27/13 and then 04/25/17 (in setting of ETOH and missed medication doses). Then 2021 and 2022 seizures / spells (in setting of stress, missed meds, lack of sleep).    Dx:  Generalized idiopathic epilepsy, not intractable, without status epilepticus ([place])  Anxiety and depression     PLAN:    SEIZURE DISORDER (last event possibly 09/21/21; triggers include stress, sleep deprivation)  - continue levetiracetam XR 500mg  x4 tabs in AM for now; do not want to increase dose further due to history of depression, suicidal thoughts, anxiety  - refer to [facility] EMU for spell characterization (had 2 episodes in the office today 10/05/21 suspicious for PNES lasting 1-2 minutes each --> eye fluttering, waxing and waning upper extremity movements, decreased tone; rapid return to consciousness without post-ictal confusion; had second similar event; patient cried upon waking up; drank water and felt back to baseline within 1-2 minutes; vitals upon completion of second event --> BP 116/81, pulse 62)  - may consider lamotrigine or topiramate in future  - no driving until seizure free x 6 months   No orders of the defined types were placed in this encounter.  No orders of the defined types were placed in this encounter.  No follow-ups on file.    Penni Bombard, MD 02/27/8465, 59:93 AM Certified in Neurology, Neurophysiology and Neuroimaging  [facility] [address] [place], [place] [address] [phone]

## 2021-10-08 ENCOUNTER — Encounter: Payer: Self-pay | Admitting: Diagnostic Neuroimaging

## 2021-10-08 ENCOUNTER — Encounter (HOSPITAL_COMMUNITY): Payer: Self-pay

## 2021-10-13 ENCOUNTER — Telehealth: Payer: Self-pay | Admitting: Family Medicine

## 2021-10-13 NOTE — Telephone Encounter (Signed)
Levetiracetam level collected 09/23/2021 was low at 5.8. Patient advised to take medication as prescribed and avoid missed doses. EMU has reached out to schedule admission. Will follow up pending results.

## 2021-10-14 ENCOUNTER — Encounter (HOSPITAL_COMMUNITY): Payer: Self-pay

## 2021-11-18 ENCOUNTER — Other Ambulatory Visit: Payer: Self-pay | Admitting: Neurology

## 2021-11-18 LAB — SARS CORONAVIRUS 2 (TAT 6-24 HRS): SARS Coronavirus 2: NEGATIVE

## 2021-11-23 ENCOUNTER — Inpatient Hospital Stay (HOSPITAL_COMMUNITY): Payer: Medicaid Other

## 2021-11-23 ENCOUNTER — Encounter (HOSPITAL_COMMUNITY): Payer: Self-pay | Admitting: Neurology

## 2021-11-23 ENCOUNTER — Inpatient Hospital Stay (HOSPITAL_COMMUNITY)
Admission: RE | Admit: 2021-11-23 | Discharge: 2021-11-27 | DRG: 101 | Disposition: A | Discharge status: Home / Self Care | Payer: Medicaid Other | Source: Ambulatory Visit | Attending: Neurology | Admitting: Neurology

## 2021-11-23 ENCOUNTER — Other Ambulatory Visit: Payer: Self-pay

## 2021-11-23 DIAGNOSIS — K589 Irritable bowel syndrome without diarrhea: Secondary | ICD-10-CM | POA: Diagnosis present

## 2021-11-23 DIAGNOSIS — Z87891 Personal history of nicotine dependence: Secondary | ICD-10-CM | POA: Diagnosis not present

## 2021-11-23 DIAGNOSIS — Z79899 Other long term (current) drug therapy: Secondary | ICD-10-CM | POA: Diagnosis not present

## 2021-11-23 DIAGNOSIS — F419 Anxiety disorder, unspecified: Secondary | ICD-10-CM | POA: Diagnosis present

## 2021-11-23 DIAGNOSIS — H9190 Unspecified hearing loss, unspecified ear: Secondary | ICD-10-CM | POA: Diagnosis present

## 2021-11-23 DIAGNOSIS — F32A Depression, unspecified: Secondary | ICD-10-CM | POA: Diagnosis present

## 2021-11-23 DIAGNOSIS — G40409 Other generalized epilepsy and epileptic syndromes, not intractable, without status epilepticus: Principal | ICD-10-CM | POA: Diagnosis present

## 2021-11-23 DIAGNOSIS — Z8661 Personal history of infections of the central nervous system: Secondary | ICD-10-CM

## 2021-11-23 DIAGNOSIS — R569 Unspecified convulsions: Secondary | ICD-10-CM | POA: Diagnosis present

## 2021-11-23 LAB — COMPREHENSIVE METABOLIC PANEL
ALT: 23 U/L (ref 0–44)
AST: 26 U/L (ref 15–41)
Albumin: 2.4 g/dL — ABNORMAL LOW (ref 3.5–5.0)
Alkaline Phosphatase: 69 U/L (ref 38–126)
Anion gap: 8 (ref 5–15)
BUN: 10 mg/dL (ref 6–20)
CO2: 24 mmol/L (ref 22–32)
Calcium: 8.1 mg/dL — ABNORMAL LOW (ref 8.9–10.3)
Chloride: 109 mmol/L (ref 98–111)
Creatinine, Ser: 0.72 mg/dL (ref 0.44–1.00)
GFR, Estimated: >60 mL/min (ref >60)
Glucose, Bld: 102 mg/dL — ABNORMAL HIGH (ref 70–99)
Potassium: 3.6 mmol/L (ref 3.5–5.1)
Sodium: 141 mmol/L (ref 135–145)
Total Bilirubin: 0.4 mg/dL (ref 0.3–1.2)
Total Protein: 5.8 g/dL — ABNORMAL LOW (ref 6.5–8.1)

## 2021-11-23 LAB — CBC WITH DIFFERENTIAL/PLATELET
Abs Immature Granulocytes: 0.01 10*3/uL (ref 0.00–0.07)
Basophils Absolute: 0 10*3/uL (ref 0.0–0.1)
Basophils Relative: 1 %
Eosinophils Absolute: 0.1 10*3/uL (ref 0.0–0.5)
Eosinophils Relative: 2 %
HCT: 42 % (ref 36.0–46.0)
Hemoglobin: 13.9 g/dL (ref 12.0–15.0)
Immature Granulocytes: 0 %
Lymphocytes Relative: 44 %
Lymphs Abs: 2.7 10*3/uL (ref 0.7–4.0)
MCH: 29.9 pg (ref 26.0–34.0)
MCHC: 33.1 g/dL (ref 30.0–36.0)
MCV: 90.3 fL (ref 80.0–100.0)
Monocytes Absolute: 0.3 10*3/uL (ref 0.1–1.0)
Monocytes Relative: 5 %
Neutro Abs: 2.9 10*3/uL (ref 1.7–7.7)
Neutrophils Relative %: 48 %
Platelets: 198 10*3/uL (ref 150–400)
RBC: 4.65 MIL/uL (ref 3.87–5.11)
RDW: 12.6 % (ref 11.5–15.5)
WBC: 6 10*3/uL (ref 4.0–10.5)
nRBC: 0 % (ref 0.0–0.2)

## 2021-11-23 LAB — RAPID URINE DRUG SCREEN, HOSP PERFORMED
Amphetamines: NOT DETECTED
Barbiturates: NOT DETECTED
Benzodiazepines: NOT DETECTED
Cocaine: NOT DETECTED
Opiates: NOT DETECTED
Tetrahydrocannabinol: NOT DETECTED

## 2021-11-23 LAB — PROTIME-INR
INR: 0.9 (ref 0.8–1.2)
Prothrombin Time: 12 seconds (ref 11.4–15.2)

## 2021-11-23 LAB — PHOSPHORUS: Phosphorus: 2.8 mg/dL (ref 2.5–4.6)

## 2021-11-23 LAB — MAGNESIUM: Magnesium: 1.8 mg/dL (ref 1.7–2.4)

## 2021-11-23 LAB — HIV ANTIBODY (ROUTINE TESTING W REFLEX): HIV Screen 4th Generation wRfx: NONREACTIVE

## 2021-11-23 MED ORDER — LORAZEPAM 2 MG/ML IJ SOLN: 2.0000 mg | INTRAMUSCULAR | Status: DC | PRN

## 2021-11-23 MED ORDER — ESTRADIOL 1 MG PO TABS
2.0000 mg | ORAL_TABLET | Freq: Every day | ORAL | Status: DC
  Administered 2021-11-24 – 2021-11-27 (×4): 2 mg via ORAL
  Filled 2021-11-23 (×4): qty 2

## 2021-11-23 MED ORDER — HOME MED STORE IN PYXIS: 1.0000 | Freq: Every day | Status: DC

## 2021-11-23 MED ORDER — ESCITALOPRAM OXALATE 10 MG PO TABS
10.0000 mg | ORAL_TABLET | Freq: Every day | ORAL | Status: DC
  Administered 2021-11-24 – 2021-11-27 (×4): 10 mg via ORAL
  Filled 2021-11-23 (×4): qty 1

## 2021-11-23 MED ORDER — SODIUM CHLORIDE 0.9% FLUSH
3.0000 mL | Freq: Two times a day (BID) | INTRAVENOUS | Status: DC
  Administered 2021-11-23 – 2021-11-26 (×8): 3 mL via INTRAVENOUS

## 2021-11-23 MED ORDER — ENOXAPARIN SODIUM 40 MG/0.4ML IJ SOSY
40.0000 mg | PREFILLED_SYRINGE | INTRAMUSCULAR | Status: DC
  Administered 2021-11-23 – 2021-11-26 (×3): 40 mg via SUBCUTANEOUS
  Filled 2021-11-23 (×4): qty 0.4

## 2021-11-23 MED ORDER — PLECANATIDE 3 MG PO TABS: 3.0000 mg | ORAL_TABLET | Freq: Every day | ORAL | Status: DC

## 2021-11-23 MED ORDER — ACETAMINOPHEN 325 MG PO TABS
650.0000 mg | ORAL_TABLET | ORAL | Status: DC | PRN
  Administered 2021-11-23 – 2021-11-26 (×4): 650 mg via ORAL
  Filled 2021-11-23 (×5): qty 2

## 2021-11-23 MED ORDER — LABETALOL HCL 5 MG/ML IV SOLN: 5.0000 mg | INTRAVENOUS | Status: DC | PRN

## 2021-11-23 MED ORDER — HYDROXYZINE HCL 25 MG PO TABS: 25.0000 mg | ORAL_TABLET | Freq: Every day | ORAL | Status: DC | PRN

## 2021-11-23 MED ORDER — PANTOPRAZOLE SODIUM 40 MG PO TBEC
40.0000 mg | DELAYED_RELEASE_TABLET | Freq: Two times a day (BID) | ORAL | Status: DC
Start: 2021-11-23 — End: 2021-11-27
  Administered 2021-11-23 – 2021-11-27 (×8): 40 mg via ORAL
  Filled 2021-11-23 (×8): qty 1

## 2021-11-23 MED ORDER — VITAMIN D 25 MCG (1000 UNIT) PO TABS
5000.0000 [IU] | ORAL_TABLET | Freq: Every day | ORAL | Status: DC
  Administered 2021-11-24 – 2021-11-27 (×4): 5000 [IU] via ORAL
  Filled 2021-11-23 (×4): qty 5

## 2021-11-23 MED ORDER — ACETAMINOPHEN 650 MG RE SUPP: 650.0000 mg | RECTAL | Status: DC | PRN

## 2021-11-23 MED ORDER — ESTRADIOL 1 MG PO TABS
2.0000 mg | ORAL_TABLET | Freq: Once | ORAL | Status: AC
  Administered 2021-11-23: 2 mg via ORAL
  Filled 2021-11-23 (×2): qty 2

## 2021-11-23 MED ORDER — PROSIGHT PO TABS
1.0000 | ORAL_TABLET | Freq: Every day | ORAL | Status: DC
Start: 2021-11-24 — End: 2021-11-27
  Administered 2021-11-24 – 2021-11-27 (×4): 1 via ORAL
  Filled 2021-11-23 (×5): qty 1

## 2021-11-23 NOTE — Progress Notes (Signed)
Started cEEG study.  Notified [facility] monitoring.  Tested patient event button. 

## 2021-11-23 NOTE — H&P (Signed)
CC: seizure  History is obtained from: patient, chart review.  Of note, history was obtained with help of interpreter and sign language  HPI: Mercedes Parks is a 41 y.o. 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  History of epilepsy: Patient states she had for seizure sometime around 2010 and was started on Keppra at that time.  States her seizures were well controlled on Keppra, had an average 1 breakthrough seizure every year.  However, since seen last October she has been having about 2 episodes per month.  During the episode she feels scared, dizzy, has tunnel vision.  She usually does her wife who makes her sit down. One time when patient did not get enough warning, she did fall on the floor and hit her head.  After this patient has full body shaking with eyes rolling in the back of the head, denies tongue bite, bowel or bladder incontinence.  States her episodes have been triggered due to use of alcohol, marijuana, missing her medications and stress.  She also reports significant stress due to wedding planning and her teenage son.  Patient states her most recent episode was on Saturday however it was different than her previous episodes.  This episode happened while she was asleep, she remembers seeing black and white shadows, feeling very scared and unable to move, lasted for few minutes after which she was eventually able to make.  This was different than her usual seizures which happen when she is awake, last for a few minutes, can have multiple seizures one after another.    I reviewed patient's clinic notes from 10/05/2021 summarized as follows.  In October 2011 patient had an episode of lightheadedness, reports sensation followed by syncope and convulsions,+ positive tongue biting, no incontinence.  In February 2012 she was exposed  to synthetic marijuana and had a similar episode.  In March 2012 patient had several episodes of nighttime awakening, "weird breathing".  EEG was done at Dr tesfaye's office which was consistent with primary generalized epilepsy.  She was started on Keppra 500 mg twice daily in April 2012.  Antiepileptics: Keppra XR 2000 mg every morning.  Denies trying any other antiepileptics in the past  Prior routine EEG on 09/09/2021: Normal EEG on 05/18/2017: Normal  ROS: All other systems reviewed and negative except as noted in the HPI.   Past Medical History:  Diagnosis Date   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   Seizure ([place])     Family History  Problem Relation Age of Onset   Healthy Mother    Healthy Father    Colon cancer Maternal Grandfather    Breast cancer Paternal Grandmother    Other Paternal Grandmother        Myles Lipps disease   Seizures Neg Hx     Social History:  reports that she quit smoking about 9 years ago. Her smoking use included cigarettes. She has never used smokeless tobacco. She reports that she does not drink alcohol and does not use drugs.  Medications Prior to Admission  Medication Sig Dispense Refill Last Dose   escitalopram (LEXAPRO) 10 MG tablet 10 mg daily.      estradiol (ESTRACE) 2 MG tablet Take 2 mg by mouth daily.      hydrOXYzine (VISTARIL) 25 MG capsule Take 25 mg by mouth as needed.  1 as needed for anxiety      levETIRAcetam (KEPPRA XR) 500 MG 24 hr tablet Take 4 tablets (2,000 mg total) by mouth daily. 120 tablet 5    pantoprazole (PROTONIX) 40 MG tablet       TRULANCE 3 MG TABS Take 1 tablet by mouth daily.      UNABLE TO FIND Med Name: Petra Kuba made ES Vit D3 5000 IU      UNABLE TO FIND Med Name: Vital fusion fiberwell gummies 1 x daily      UNABLE TO FIND Med Name:Equate MVI Adults 50+, 1 daily         Exam: Current vital signs: There were no vitals taken for this visit. Vital signs in last 24 hours:     Physical Exam   Constitutional: Appears well-developed and well-nourished.  Psych: Affect appropriate to situation Eyes: No scleral injection HENT: No OP obstrucion Head: Normocephalic.  Cardiovascular: Normal rate and regular rhythm.  Respiratory: Effort normal, non-labored breathing GI: Soft.  No distension. There is no tenderness.  Skin: Warm Neuro: AOx3, cranial nerves II grossly intact, antigravity strength in upper extremities  I have reviewed labs in epic and the results pertinent to this consultation are: CBC: No results for input(s): WBC, NEUTROABS, HGB, HCT, MCV, PLT in the last 168 hours.  Basic Metabolic Panel:  Lab Results  Component Value Date   NA 139 08/29/2021   K 4.3 08/29/2021   CO2 25 08/29/2021   GLUCOSE 95 08/29/2021   BUN 16 08/29/2021   CREATININE 0.94 08/29/2021   CALCIUM 8.3 (L) 08/29/2021   GFRNONAA >60 08/29/2021   GFRAA 110 05/15/2018   Lipid Panel: No results found for: LDLCALC HgbA1c:  Lab Results  Component Value Date   HGBA1C 5.0 05/15/2018   Urine Drug Screen: No results found for: LABOPIA, COCAINSCRNUR, LABBENZ, AMPHETMU, THCU, LABBARB  Alcohol Level No results found for: ETH   I have reviewed the images obtained:  CT head without contrast 05/19/2017: Unremarkable  ASSESSMENT/PLAN: 41 year old female with history of generalized epilepsy (reportedly had one EEG that was suggestive of generalized epilepsy but not available for review).  Patient's seizures are well controlled on Keppra until October 2022 when she has had increased frequency of seizures.  Seizures -We will order video EEG for characterization of spells -Hold Keppra for seizure provocation -We will perform hyperventilation and photic stimulation for seizure provocation tomorrow -Continue seizure precautions -As needed IV Ativan 2 mg for clinical seizure-like activity  Anxiety Depression -continue home medications  Irritable bowel syndrome -Continue home medication  [facility]

## 2021-11-24 NOTE — Progress Notes (Signed)
°  Transition of Care [facility]) Screening Note   Patient Details  Name: Mercedes Parks Date of Birth: 1980/12/11   [facility] CM/SW Contact:    Pollie Friar, RN Phone Number: 11/24/2021, 1:42 PM    Transition of Care Department [facility]) has reviewed patient and no TOC needs have been identified at this time. We will continue to monitor patient advancement through interdisciplinary progression rounds. If new patient transition needs arise, please place a TOC consult.

## 2021-11-24 NOTE — Progress Notes (Signed)
LTM EEG maint complete.

## 2021-11-24 NOTE — Progress Notes (Addendum)
Subjective: No seizures, warning signs overnight.  Denies any concerns.  States drinking energy drinks can sometimes trigger episodes.  Of note, communicated with patient via ASL interpreter.  ROS: negative except above  Examination  Vital signs in last 24 hours: Temp:  [97.6 F (36.4 C)-97.8 F (36.6 C)] 97.6 F (36.4 C) (02/28 0738) Pulse Rate:  [60-68] 60 (02/28 0738) Resp:  [16-19] 19 (02/28 0738) BP: (98-107)/(59-74) 98/74 (02/28 0738) SpO2:  [98 %] 98 % (02/28 0738)  General: lying in bed, NAD CVS: pulse-normal rate and rhythm RS: breathing comfortably Extremities: normal  Neuro:: AOx3, cranial nerves II grossly intact, antigravity strength in all extremities  Basic Metabolic Panel: Recent Labs  Lab 11/23/21 1332  NA 141  K 3.6  CL 109  CO2 24  GLUCOSE 102*  BUN 10  CREATININE 0.72  CALCIUM 8.1*  MG 1.8  PHOS 2.8    CBC: Recent Labs  Lab 11/23/21 1332  WBC 6.0  NEUTROABS 2.9  HGB 13.9  HCT 42.0  MCV 90.3  PLT 198     Coagulation Studies: Recent Labs    11/23/21 1332  LABPROT 12.0  INR 0.9    Imaging No new imaging overnight  ASSESSMENT AND PLAN: 41 year old female with history of generalized epilepsy (reportedly had one EEG that was suggestive of generalized epilepsy but not available for review).  Patient's seizures are well controlled on Keppra until October 2022 when she has had increased frequency of seizures.   Seizures -Continue video EEG for characterization of spells -Continue to hold Keppra for seizure provocation -We will perform hyperventilation and photic stimulation for seizure provocation either today or tomorrow depending on tech availability -Sleep deprivation -Continue seizure precautions -As needed IV Ativan 2 mg for clinical seizure-like activity   Anxiety Depression -continue home medications   Irritable bowel syndrome -Continue home medication  I have spent a total of  36  minutes with the patient reviewing  hospital notes,  test results, labs and examining the patient as well as establishing an assessment and plan that was discussed personally with the patient and RN.  > 50% of time was spent in direct patient care.   Zeb Comfort [facility] For questions after 5pm please refer to AMION to reach the Neurologist on call

## 2021-11-24 NOTE — Procedures (Signed)
Patient Name: Mercedes Parks  MRN: [id number]  Epilepsy Attending: Lora Havens  Referring Physician/Provider: Dr Zeb Comfort Duration: 11/23/2021 0935 to 11/24/2021 0935  Patient history: 41 year old female with history of generalized epilepsy (reportedly had one EEG that was suggestive of generalized epilepsy but not available for review).  Patient's seizures are well controlled on Keppra until October 2022 when she has had increased frequency of seizures.  EEG for characterization of spells.  Level of alertness: Awake, asleep  AEDs during EEG study: None  Technical aspects: This EEG study was done with scalp electrodes positioned according to the 10-20 International system of electrode placement. Electrical activity was acquired at a sampling rate of 500Hz  and reviewed with a high frequency filter of 70Hz  and a low frequency filter of 1Hz . EEG data were recorded continuously and digitally stored.   Description: The posterior dominant rhythm consists of 9 Hz activity of moderate voltage (25-35 uV) seen predominantly in posterior head regions, symmetric and reactive to eye opening and eye closing. Sleep was characterized by vertex waves, sleep spindles (12 to 14 Hz), maximal frontocentral region. Hyperventilation and photic stimulation were not performed.     IMPRESSION: This study is within normal limits. No seizures or epileptiform discharges were seen throughout the recording.  Heaven Meeker Barbra Sarks

## 2021-11-25 NOTE — Procedures (Signed)
Patient Name: Mercedes Parks  ?MRN: [id number]  ?Epilepsy Attending: Lora Havens  ?Referring Physician/Provider: Dr Zeb Comfort ?Duration: 11/24/2021 0935 to 3/12023 0935 ?  ?Patient history: 41 year old female with history of generalized epilepsy (reportedly had one EEG that was suggestive of generalized epilepsy but not available for review).  Patient's seizures are well controlled on Keppra until October 2022 when she has had increased frequency of seizures.  EEG for characterization of spells. ?  ?Level of alertness: Awake, asleep ?  ?AEDs during EEG study: None ?  ?Technical aspects: This EEG study was done with scalp electrodes positioned according to the 10-20 International system of electrode placement. Electrical activity was acquired at a sampling rate of 500Hz  and reviewed with a high frequency filter of 70Hz  and a low frequency filter of 1Hz . EEG data were recorded continuously and digitally stored.  ?  ?Description: The posterior dominant rhythm consists of 9 Hz activity of moderate voltage (25-35 uV) seen predominantly in posterior head regions, symmetric and reactive to eye opening and eye closing. Sleep was characterized by vertex waves, sleep spindles (12 to 14 Hz), maximal frontocentral region. Hyperventilation and photic stimulation were not performed.    ? ?Patient event button was pressed on 11/24/2021 at 2028.  Patient reported aura described as "feeling cold". Concomitant EEG before, during and after the event did not show any EEG changes any seizure. ?  ?IMPRESSION: ?This study is within normal limits. No seizures or epileptiform discharges were seen throughout the recording. ? ?Patient was placed on 11/16/2021 at 2028 during which patient reported getting an aura of "feeling cold".  Concomitant EEG did not show any EEG change.  However aura may not be seen on scalp EEG. ?

## 2021-11-25 NOTE — Progress Notes (Addendum)
Subjective: No seizures. ? ?Of note, communicated with patient via ASL interpreter. ? ?ROS: negative except above ? ?Examination ? ?Vital signs in last 24 hours: ?Temp:  [97.5 ?F (36.4 ?C)-98.3 ?F (36.8 ?C)] 97.8 ?F (36.6 ?C) (03/01 1524) ?Pulse Rate:  [62-75] 70 (03/01 1524) ?Resp:  [15-18] 18 (03/01 0745) ?BP: (92-120)/(60-76) 97/65 (03/01 1524) ?SpO2:  [98 %-100 %] 98 % (03/01 1524) ? ?General: lying in bed, NAD ?CVS: pulse-normal rate and rhythm ?RS: breathing comfortably ?Extremities: normal  ?Neuro:: AOx3, cranial nerves II grossly intact, antigravity strength in all extremities ? ?Basic Metabolic Panel: ?Recent Labs  ?Lab 11/23/21 ?1332  ?NA 141  ?K 3.6  ?CL 109  ?CO2 24  ?GLUCOSE 102*  ?BUN 10  ?CREATININE 0.72  ?CALCIUM 8.1*  ?MG 1.8  ?PHOS 2.8  ? ? ?CBC: ?Recent Labs  ?Lab 11/23/21 ?1332  ?WBC 6.0  ?NEUTROABS 2.9  ?HGB 13.9  ?HCT 42.0  ?MCV 90.3  ?PLT 198  ? ? ? ?Coagulation Studies: ?Recent Labs  ?  11/23/21 ?1332  ?LABPROT 12.0  ?INR 0.9  ? ? ?Imaging ?No new imaging overnight ?  ?ASSESSMENT AND PLAN: 41 year old female with history of generalized epilepsy (reportedly had one EEG that was suggestive of generalized epilepsy but not available for review).  Patient's seizures are well controlled on Keppra until October 2022 when she has had increased frequency of seizures. ?  ?Seizures ?-Continue video EEG for characterization of spells ?-Continue to hold Keppra for seizure provocation ?-We will perform hyperventilation and photic stimulation for seizure provocation today  ?-I encouraged patient to drink energy drinks because provoked seizures according to patient. ?-Continue seizure precautions ?-As needed IV Ativan 2 mg for clinical seizure-like activity ?  ?Anxiety ?Depression ?-continue home medications ?  ?Irritable bowel syndrome ?-Continue home medication ?  ? ? ?Zeb Comfort ?Epilepsy ?[facility] ?For questions after 5pm please refer to AMION to reach the Neurologist on call ? ?

## 2021-11-25 NOTE — Progress Notes (Signed)
LTM maint complete - no skin breakdown under:  FP1 FP2 A1 

## 2021-11-26 MED ORDER — LEVETIRACETAM ER 500 MG PO TB24
2000.0000 mg | ORAL_TABLET | Freq: Every day | ORAL | Status: DC
  Administered 2021-11-27: 2000 mg via ORAL
  Filled 2021-11-26: qty 4

## 2021-11-26 MED ORDER — ONDANSETRON HCL 4 MG/2ML IJ SOLN: 4.0000 mg | Freq: Three times a day (TID) | INTRAMUSCULAR | Status: DC | PRN

## 2021-11-26 MED ORDER — LEVETIRACETAM 500 MG PO TABS
1000.0000 mg | ORAL_TABLET | Freq: Every day | ORAL | Status: AC
  Administered 2021-11-26: 1000 mg via ORAL
  Filled 2021-11-26: qty 2

## 2021-11-26 MED ORDER — ONDANSETRON HCL 4 MG/2ML IJ SOLN
INTRAMUSCULAR | Status: AC
  Administered 2021-11-26: 4 mg
  Filled 2021-11-26: qty 2

## 2021-11-26 NOTE — Progress Notes (Signed)
LTM maint complete - no skin breakdown under:  Fp1 FP2 P4 ?

## 2021-11-26 NOTE — Procedures (Addendum)
Patient Name: Mercedes Parks  ?MRN: [id number]  ?Epilepsy Attending: Lora Havens  ?Referring Physician/Provider: Dr Zeb Comfort ?Duration: 11/25/2021 0935 to 11/26/2021 0935 ?  ?Patient history: 41 year old female with history of generalized epilepsy (reportedly had one EEG that was suggestive of generalized epilepsy but not available for review).  Patient's seizures are well controlled on Keppra until October 2022 when she has had increased frequency of seizures.  EEG for characterization of spells. ?  ?Level of alertness: Awake, asleep ?  ?AEDs during EEG study: None ?  ?Technical aspects: This EEG study was done with scalp electrodes positioned according to the 10-20 International system of electrode placement. Electrical activity was acquired at a sampling rate of 500Hz  and reviewed with a high frequency filter of 70Hz  and a low frequency filter of 1Hz . EEG data were recorded continuously and digitally stored.  ?  ?Description: The posterior dominant rhythm consists of 9 Hz activity of moderate voltage (25-35 uV) seen predominantly in posterior head regions, symmetric and reactive to eye opening and eye closing. Sleep was characterized by vertex waves, sleep spindles (12 to 14 Hz), maximal frontocentral region.  Physiologic photic driving was seen during photic stimulation.  No EEG change was seen during hyperventilation. ?  ?IMPRESSION: ?This study is within normal limits. No seizures or epileptiform discharges were seen throughout the recording. ?  ?Lora Havens  ?

## 2021-11-26 NOTE — Progress Notes (Addendum)
Subjective: No acute events overnight. ?Of note, communicated with patient via ASL interpreter. ? ?ROS: negative except above ? ?Examination ? ?Vital signs in last 24 hours: ?Temp:  [97.5 ?F (36.4 ?C)-98.2 ?F (36.8 ?C)] 98.2 ?F (36.8 ?C) (03/02 0745) ?Pulse Rate:  [58-87] 58 (03/02 0745) ?Resp:  [14-17] 17 (03/02 0745) ?BP: (91-107)/(52-70) 98/70 (03/02 0745) ?SpO2:  [98 %] 98 % (03/02 0745) ? ?General: lying in bed, NAD ?CVS: pulse-normal rate and rhythm ?RS: breathing comfortably ?Extremities: normal  ?Neuro:: AOx3, cranial nerves II -XII grossly intact, antigravity strength in all extremities ?  ? ?Basic Metabolic Panel: ?Recent Labs  ?Lab 11/23/21 ?1332  ?NA 141  ?K 3.6  ?CL 109  ?CO2 24  ?GLUCOSE 102*  ?BUN 10  ?CREATININE 0.72  ?CALCIUM 8.1*  ?MG 1.8  ?PHOS 2.8  ? ? ?CBC: ?Recent Labs  ?Lab 11/23/21 ?1332  ?WBC 6.0  ?NEUTROABS 2.9  ?HGB 13.9  ?HCT 42.0  ?MCV 90.3  ?PLT 198  ? ? ? ?Coagulation Studies: ?Recent Labs  ?  11/23/21 ?1332  ?LABPROT 12.0  ?INR 0.9  ? ?  ?Imaging ?No new imaging overnight ?  ?ASSESSMENT AND PLAN: 41 year old female with history of generalized epilepsy (reportedly had one EEG that was suggestive of generalized epilepsy but not available for review).  Patient's seizures are well controlled on Keppra until October 2022 when she has had increased frequency of seizures. ?  ?Seizures ?-Continue video EEG for characterization of spells ?-We will resume Keppra tonight ?- Patient shared a video of one event she had in November 2021 where patient was laying on the couch had intermittent nonrhythmic jerking, eyes closed. Discussed possibility of nonepileptic events because of the semiology of the event, normal EEG and patient's report of significant increase in stress recently and the temporal relationship with recurrence of these events. ?-Discussed management of nonepileptic events including cognitive behavioral therapy.  Patient also has a therapist.  I will share her records with  therapist. ?-If patient continues to have further events, can consider adding lamotrigine to help with mood stabilization. ?-Continue seizure precautions ?-As needed IV Ativan 2 mg for clinical seizure-like activity ?  ?Anxiety ?Depression ?-continue home medications ?  ?Irritable bowel syndrome ?-Continue home medication ? ?I have spent a total of 37 minutes with the patient reviewing hospital notes,  test results, labs and examining the patient as well as establishing an assessment and plan that was discussed personally with the patient.  > 50% of time was spent in direct patient care. ?  ?  ? ?  ? ?Zeb Comfort ?Epilepsy ?[facility] ?For questions after 5pm please refer to AMION to reach the Neurologist on call ? ?

## 2021-11-27 NOTE — TOC Transition Note (Signed)
Transition of Care (TOC) - CM/SW Discharge Note ? ? ?Patient Details  ?Name: Mercedes Parks ?MRN: [id number] ?Date of Birth: 1981/05/18 ? ?Transition of Care (TOC) CM/SW Contact:  ?Pollie Friar, RN ?Phone Number: ?11/27/2021, 10:13 AM ? ? ?Clinical Narrative:    ?Patient is discharging home with self care. No needs per TOC. ? ? ?Final next level of care: Home/Self Care ?Barriers to Discharge: No Barriers Identified ? ? ?Patient Goals and CMS Choice ?  ?  ?  ? ?Discharge Placement ?  ?           ?  ?  ?  ?  ? ?Discharge Plan and Services ?  ?  ?           ?  ?  ?  ?  ?  ?  ?  ?  ?  ?  ? ?Social Determinants of Health (SDOH) Interventions ?  ? ? ?Readmission Risk Interventions ?No flowsheet data found. ? ? ? ? ?

## 2021-11-27 NOTE — Discharge Instructions (Signed)
You were admitted to epilepsy monitoring unit from 11/23/2021 to 11/27/2021.  During this period, you underwent continuous video EEG monitoring.  We performed hyperventilation, photic stimulation, sleep deprivation and discontinued your seizure medication to provoke seizures.  Your EEG was normal, no events were recorded.  We recommend continuing your seizure medication at the current dose and following up with neurology.  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  Recommend following up with your therapist.  Continue seizure precautions including do not drive for 6 months//until cleared by physician. ?

## 2021-11-27 NOTE — Procedures (Addendum)
Patient Name: Mercedes Parks  ?MRN: [id number]  ?Epilepsy Attending: Lora Havens  ?Referring Physician/Provider: Dr Zeb Comfort ?Duration: 11/26/2021 0935 to 11/27/2021 3382 ?  ?Patient history: 41 year old female with history of generalized epilepsy (reportedly had one EEG that was suggestive of generalized epilepsy but not available for review).  Patient's seizures are well controlled on Keppra until October 2022 when she has had increased frequency of seizures.  EEG for characterization of spells. ?  ?Level of alertness: Awake, asleep ?  ?AEDs during EEG study: LEV ?  ?Technical aspects: This EEG study was done with scalp electrodes positioned according to the 10-20 International system of electrode placement. Electrical activity was acquired at a sampling rate of 500Hz  and reviewed with a high frequency filter of 70Hz  and a low frequency filter of 1Hz . EEG data were recorded continuously and digitally stored.  ?  ?Description: The posterior dominant rhythm consists of 9 Hz activity of moderate voltage (25-35 uV) seen predominantly in posterior head regions, symmetric and reactive to eye opening and eye closing. Sleep was characterized by vertex waves, sleep spindles (12 to 14 Hz), maximal frontocentral region.  Photic stimulation and hyperventilation were not performed. ? ?IMPRESSION: ?This study is within normal limits. No seizures or epileptiform discharges were seen throughout the recording. ?  ?Lora Havens  ?

## 2021-11-27 NOTE — Progress Notes (Signed)
Patient ready for discharge to home; discharge instructions given and reviewed; no new Rx. Patient discharged out via wheelchair by volunteer services. Accompanied home by her partner. ?

## 2021-11-27 NOTE — Progress Notes (Signed)
EMU D/C'd no skin breakdown noted. [facility] was notified.  ?

## 2021-11-27 NOTE — Discharge Summary (Addendum)
Physician Discharge Summary  Patient ID: Mercedes Parks MRN: [id number] DOB/AGE: March 22, 1981 41 y.o.  Admit date: 11/23/2021 Discharge date: 11/27/2021  Admission Diagnoses: Seizure  Discharge Diagnoses: Transient alteration of awareness with convulsions  Discharged Condition: stable  Hospital Course: Mercedes Parks was admitted to epilepsy monitoring unit from 11/23/2021 to 11/27/2021.  During this period, she underwent continuous video EEG monitoring.  Hyperventilation, photic stimulation, sleep deprivation were performed and Keppra was held for seizure provocation.  Patient event button was pressed on 11/16/2021 during which patient reported getting an aura of "feeling cold" without concomitant EEG change.  Aura may not be seen on scalp EEG.  However, patient was able to show me one of her events.  During the event patient was laying on a couch, had nonrhythmic jerking of extremities, eyes closed and not responding.  The semiology of this event was suspicious for nonepileptic events.  Patient reports having average 1 breakthrough event every year on Keppra until last year when her events worsened and is now having average 2 episodes per month.  Patient also reported significant stressors including getting married, difficult relationship with ex-husband, teenage son.  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  I also discussed cognitive behavioral therapy.  Patient reports having a therapist, I will fax patient's records to the therapist.  If patient's episodes continue, could consider adding lamotrigine which also has mood stabilizing properties.  If episodes become more frequent in future, can reconsider EMU evaluation.   I counseled patient about  seizure precautions including do not drive until episodes are well controlled and patient is cleared by physician.  Seizure precautions: Per [facility] statutes, patients with seizures are not allowed to drive until they have been seizure-free for six months and cleared by a physician    Use caution when using heavy equipment or power tools. Avoid working on ladders or at heights. Take showers instead of baths. Ensure the water temperature is not too high on the home water heater. Do not go swimming alone. Do not lock yourself in a room alone (i.e. bathroom). When caring for infants or small children, sit down when holding, feeding, or changing them to minimize risk of injury to the child in the event you have a seizure. Maintain good sleep hygiene. Avoid alcohol.    If patient has another seizure, call 911 and bring them back to the ED if: A.  The seizure lasts longer than 5 minutes.      B.  The patient doesn't wake shortly after the seizure or has new problems such as difficulty seeing, speaking or moving following the seizure C.  The patient was injured during the seizure D.  The patient has a temperature over 102 F (39C) E.  The patient vomited during the seizure and now is having trouble breathing    During the Seizure   - First, ensure adequate ventilation and place patients on the floor on their left side  Loosen clothing around the neck and ensure the airway is patent. If the patient is clenching the teeth, do not force the mouth open with any object as this can cause severe damage - Remove all items from the surrounding that can be hazardous. The patient may be oblivious to what's happening  and may not even know what he or she is doing. If the patient is confused and wandering, either gently guide him/her away and block access to outside areas - Reassure the individual and be comforting - Call 911. In most cases, the seizure ends before EMS arrives. However, there are cases  when seizures may last over 3 to 5 minutes. Or the individual may have developed breathing difficulties or severe injuries. If a pregnant patient or a person with diabetes develops a seizure, it is prudent to call an ambulance. - Finally, if the patient does not regain full consciousness, then call EMS. Most patients will remain confused for about 45 to 90 minutes after a seizure, so you must use judgment in calling for help.     After the Seizure (Postictal Stage)   After a seizure, most patients experience confusion, fatigue, muscle pain and/or a headache. Thus, one should permit the individual to sleep. For the next few days, reassurance is essential. Being calm and helping reorient the person is also of importance.   Most seizures are painless and end spontaneously. Seizures are not harmful to others but can lead to complications such as stress on the lungs, brain and the heart. Individuals with prior lung problems may develop labored breathing and respiratory distress.    Consults: None  Significant Diagnostic Studies:   Description: The posterior dominant rhythm consists of 9 Hz activity of moderate voltage (25-35 uV) seen predominantly in posterior head regions, symmetric and reactive to eye opening and eye closing. Sleep was characterized by vertex waves, sleep spindles (12 to 14 Hz), maximal frontocentral region.  Physiologic photic driving was seen during photic stimulation.  No EEG change was seen during hyperventilation.  Patient event button was pressed on 11/24/2021 at 2028.  Patient reported aura described as "feeling cold". Concomitant EEG before, during and after the event did not show any EEG changes any seizure.    IMPRESSION: This study is within normal limits. No seizures or epileptiform discharges were seen throughout the recording.  Patient event button was pressed on 11/16/2021 at 2028 during which patient reported getting an aura of "feeling cold".  Concomitant EEG did  not show any EEG change.  However aura may not be seen on scalp EEG   Treatments: Continue Keppra extended release 2000 mg daily  Discharge Exam: Blood pressure (!) 105/38, pulse 71, temperature 98.2 F (36.8 C), temperature source Oral, resp. rate 17, SpO2 100 %.  General: lying in bed, NAD CVS: pulse-normal rate and rhythm RS: breathing comfortably Extremities: normal  Neuro:: AOx3, cranial nerves II -XII grossly intact, antigravity strength in all extremities  Disposition: Home   Allergies as of 11/27/2021       Reactions   Amoxicillin Hives   Hydrocodone Nausea And Vomiting   Nsaids    Gastric Bypass Surgery   Penicillins Hives   Has patient had a PCN reaction causing immediate rash, facial/tongue/throat swelling, SOB or lightheadedness with hypotension: Yes Has patient had a PCN reaction causing severe rash involving mucus membranes or skin necrosis: No Has patient had a PCN reaction that required hospitalization No Has patient had a PCN reaction occurring within the last 10 years: Non If all of the above answers are "NO", then may proceed with Cephalosporin use.   Sulfa Antibiotics Hives        Medication List     TAKE these medications    acetaminophen 500 MG tablet Commonly known as: TYLENOL Take 500 mg by mouth every  6 (six) hours as needed for mild pain.   escitalopram 10 MG tablet Commonly known as: LEXAPRO Take 10 mg by mouth daily.   estradiol 2 MG tablet Commonly known as: ESTRACE Take 2 mg by mouth daily.   famotidine 10 MG tablet Commonly known as: PEPCID Take 10 mg by mouth 2 (two) times daily as needed for heartburn or indigestion.   hydrOXYzine 25 MG capsule Commonly known as: VISTARIL Take 25 mg by mouth daily as needed for anxiety.   levETIRAcetam 500 MG 24 hr tablet Commonly known as: KEPPRA XR Take 4 tablets (2,000 mg total) by mouth daily.   levocetirizine 5 MG tablet Commonly known as: XYZAL Take 5 mg by mouth daily.    pantoprazole 40 MG tablet Commonly known as: PROTONIX Take 40 mg by mouth 2 (two) times daily.   Trulance 3 MG Tabs Generic drug: Plecanatide Take 1 tablet by mouth daily.   UNABLE TO FIND Med Name: Petra Kuba made ES Vit D3 5000 IU   UNABLE TO FIND Med Name: Vital fusion fiberwell gummies 1 x daily   UNABLE TO FIND Med Name:Equate MVI Adults 50+, 1 daily       I have spent a total of  36  minutes with the patient reviewing hospital notes,  test results, labs and examining the patient as well as establishing an assessment and plan that was discussed personally with the patient.  > 50% of time was spent in direct patient care.   Signed: Lora Havens 11/27/2021, 8:44 AM

## 2022-03-29 ENCOUNTER — Telehealth: Payer: Self-pay | Admitting: Diagnostic Neuroimaging

## 2022-03-29 NOTE — Telephone Encounter (Signed)
LVM (with interpreter services) and sent mychart msg informing pt of r/s needed for 7/10 appt- MD out.

## 2022-03-31 ENCOUNTER — Encounter: Payer: Self-pay | Admitting: Diagnostic Neuroimaging

## 2022-04-05 ENCOUNTER — Ambulatory Visit: Payer: Medicaid Other | Admitting: Diagnostic Neuroimaging

## 2022-04-27 ENCOUNTER — Ambulatory Visit: Payer: Medicaid Other | Admitting: Diagnostic Neuroimaging

## 2022-05-10 ENCOUNTER — Encounter: Payer: Self-pay | Admitting: Diagnostic Neuroimaging

## 2022-05-10 ENCOUNTER — Ambulatory Visit (INDEPENDENT_AMBULATORY_CARE_PROVIDER_SITE_OTHER): Payer: 59 | Admitting: Diagnostic Neuroimaging

## 2022-05-10 VITALS — BP 121/87 | HR 69 | Ht 67.0 in | Wt 239.8 lb

## 2022-05-10 DIAGNOSIS — R4689 Other symptoms and signs involving appearance and behavior: Secondary | ICD-10-CM

## 2022-05-10 MED ORDER — LEVETIRACETAM ER 500 MG PO TB24: 1500.0000 mg | ORAL_TABLET | Freq: Every day | ORAL | 4 refills | Status: DC

## 2022-05-10 NOTE — Progress Notes (Signed)
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  Had video EEG admission in February to March 2023.  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  She was continued on levetiracetam 2000 g a day and recommended follow-up.  Since then she has done well.  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  He had some dj vu sensation with this event.  UPDATE (10/05/21, VRP): Here for eval of recurrent events. Had seizure like spells on 08/29/21 (shaking, eye fluttering, went to ER) and at home 09/21/21 (brief zoneout, was able to snap out of with tap on shoulder). Difficulty with sleep. Tolerating meds.   UPDATE (08/12/17, VRP): Since last visit, doing well. Tolerating meds. No alleviating or aggravating factors. No more seizures. Does note some vivid dreams (wakes up and feels like something has touched her)  UPDATE 05/10/17: Since last visit, was doing well, and had gastric bypass surgery (Oct 2017). Then on 04/25/17, was drinking etoh (~20 oz, red's applehad not done so in a while), missed evening keppra doses in July 2018. Then had multiple breakthrough seizures outside (2-3 minutes each; 4-5 sz), then came inside with help of friends, and continued to have more seizures (another 45-60 minutes)> EMS arrived on scene. They were able to "stop" a few seizures with sternal rub. EMT raised  possibility of pseudoseizures. Patient went to ER and was observed few hours and then d/c home. Records reviewed. Urine preg test neg. Labs neg. Patient was confused, tired, sore. She thinks she hit her head.   UPDATE 02/11/16: Since last visit, doing well. No seizures. Had a strange dream 2-3 weeks ago, that felt like possible seizure, then she woke up gasping for breath. Denies snoring or witnessed apnea, but lives alone. Has some daytime sleepiness even after full nights sleep.  UPDATE 02/03/15 (VRP): Since last visit, doing well. No seizures. Tolerating medications. Some more anxiety, now on cymbalta, and feels better.  UPDATE 08/05/14 (LL): Since last visit, patient has been well, no seizures. She is assisted by sign-language interpreter today. Tolerating Levitiracetam 1000 mg BID well.  Has been having some increased blurred vision and occasional diplopia, recent eye exam was normal with the exception of a change on her eyeglass Rx. She is worried about memory loss, having trouble with remembering appointments and things that she and her son have scheduled. Has lost 40 lb. In the last year.   UPDATE 01/30/14 (LL):  Mercedes Parks comes in for routine follow up, no seizures since last visit.  She had total hysterectomy in December due to endometriosis.  Tolerating Levitiracetam 1000 mg BID with only occasional dizziness.  She reports trouble staying asleep, wakes usually 2-3 times. Doing very well.  Asks about "vaping" and if it may trigger seizure.  UPDATE 07/31/13: On 07/27/13 patient was at a [employer], had several  alcoholic drinks, was exposed to secondhand marijuana smoke, and had multiple back-to-back seizures. Apparently she had several minutes of convulsions followed by several minutes of remission. Seizures started and stopped multiple times. Ambulance was called and arrived to pick her up within 45 minutes of onset of symptoms. The symptoms continued while patient was at the hospital. She was given  benzodiazepine in the ambulance. She was given 1000 mg Keppra IV at the hospital. She was continued on the same dose of antiseizure medication (levetiracetam 500 mg twice a day). Patient tells that she has been missing some doses of antiseizure medication. She misses 4-8 tablets per month. This usually happens when she falls asleep after school and misses her dose.   UPDATE 11/20/12: She is doing very well, there was no recurrent seizure, she is tolerating Keppra 500 mg twice a day.    UPDATE 02/02/12: No sz. More balance troubles, weight gain, decr energy, and fatigue. Drinks lots of "energy drinks". Having trouble with stairs (trips and falls).   UPDATE 07/06/11: Doing well. No sz. Back to driving. Now with endometriosis and planning to start lupron shots, but concerned about warning on package about epilepsy.    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 He is accompanied by her mother, father and sign language interpreter.  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 Age 15 yrs - cochlear implants. Oct 2011 - dx'd with endometriosis; treated with tramadol; then had an episode of lightheadedness, out of body sensation, then passed out; reportedly had convulsions and tongue biting, no incontinence; then slept for 2 hours. Feb 2012 - exposure to synthetic marijuana, then had similar episode of seizure. March 2012 - several episodes of night time awakening, "weird breathing", not knowing if she is awake or dreaming; EEG done at Dr. 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  April 2012 - started on LEV '500mg'$  BID    REVIEW OF SYSTEMS: Full 14 system review of systems performed and negative except: as per HPI.   ALLERGIES: Allergies  Allergen Reactions   Amoxicillin Hives   Hydrocodone Nausea And Vomiting   Nsaids     Gastric Bypass Surgery    Penicillins Hives    Has patient had a PCN reaction causing immediate rash, facial/tongue/throat swelling, SOB or lightheadedness with hypotension: Yes Has patient had a PCN reaction causing severe rash involving mucus membranes or skin necrosis: No Has patient had a PCN reaction that required hospitalization No Has patient had a PCN reaction occurring within the last 10 years: Non If all of the above answers are "NO", then may proceed with Cephalosporin use.    Sulfa Antibiotics Hives    HOME MEDICATIONS: Outpatient Medications Prior to Visit  Medication Sig Dispense Refill   acetaminophen (TYLENOL) 500 MG tablet Take 500 mg by mouth every 6 (six) hours as needed for mild pain.     escitalopram (LEXAPRO) 10 MG tablet Take 10 mg by mouth daily.     escitalopram (LEXAPRO) 5 MG tablet Take 5 mg by mouth daily. Plus 10 mg daily     estradiol (ESTRACE) 2 MG tablet Take 2 mg by mouth daily.     pantoprazole (PROTONIX) 40 MG tablet Take 40 mg by mouth 2 (two) times daily.     TRULANCE 3 MG TABS Take 1 tablet by mouth daily.     UNABLE TO FIND Med Name: Petra Kuba made  ES Vit D3 5000 IU     UNABLE TO FIND Med Name: Vital fusion fiberwell gummies 1 x daily     UNABLE TO FIND Med Name:Equate MVI Adults 50+, 1 daily     levETIRAcetam (KEPPRA XR) 500 MG 24 hr tablet Take 4 tablets (2,000 mg total) by mouth daily. 120 tablet 5   levocetirizine (XYZAL) 5 MG tablet Take 5 mg by mouth daily. (Patient not taking: Reported on 05/10/2022)     famotidine (PEPCID) 10 MG tablet Take 10 mg by mouth 2 (two) times daily as needed for heartburn or indigestion.     hydrOXYzine (VISTARIL) 25 MG capsule Take 25 mg by mouth daily as needed for anxiety. (Patient not taking: Reported on 11/24/2021)     No facility-administered medications prior to visit.   Family History  Problem Relation Age of Onset   Healthy Mother    Healthy Father    Colon cancer Maternal Grandfather    Breast cancer Paternal Grandmother    Other  Paternal Grandmother        Myles Lipps disease   Seizures Neg Hx    Past Medical History:  Diagnosis Date   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   Seizure ([place])    04/28/22   Past Surgical History:  Procedure Laterality Date   ABDOMINAL HYSTERECTOMY  2011   CESAREAN SECTION     @ age 65   CHOLECYSTECTOMY     COCHLEAR IMPLANT     @ age 2   COCHLEAR IMPLANT  12/2020   removed 01/08/22   GASTRIC BYPASS  07/05/2016   PANNICULECTOMY  10/12/2017   Dr. Kellie Simmering   TONSILECTOMY, ADENOIDECTOMY, BILATERAL MYRINGOTOMY AND TUBES     Social History   Socioeconomic History   Marital status: Married    Spouse name: Nicki   Number of children: 1   Years of education: College   Highest education level: Not on file  Occupational History    Comment: n/a-unemployed  Tobacco Use   Smoking status: Former    Types: Cigarettes    Quit date: 09/27/2012    Years since quitting: 9.6   Smokeless tobacco: Never  Vaping Use   Vaping Use: Never used  Substance and Sexual Activity   Alcohol use: No    Alcohol/week: 0.0 standard drinks of alcohol    Comment: quit 11/2010   Drug use: No    Comment: marijuana; quit 3/ 2012   Sexual activity: Not on file  Other Topics Concern   Not on file  Social History Narrative   Patient lives with wife.   Patient is right handed.   Patient has hs education and is currently in college.   Patient does not drink any caffeine.   Social Determinants of Health   Financial Resource Strain: Not on file  Food Insecurity: Not on file  Transportation Needs: Not on file  Physical Activity: Not on file  Stress: Not on file  Social Connections: Not on file  Intimate Partner Violence: Not on file     PHYSICAL EXAM  GENERAL EXAM/CONSTITUTIONAL: Vitals:  Vitals:   05/10/22 1413  BP: 121/87  Parks: 69  Weight: 239 lb 12.8 oz (108.8 kg)  Height: '5\' 7"'$  (1.702 m)   Body mass index is 37.56 kg/m. Wt Readings from Last 6 Encounters:  05/10/22 239 lb 12.8  oz (108.8 kg)  10/05/21 230 lb (104.3 kg)  09/02/21 221 lb (100.2 kg)  08/29/21 196 lb 13.9  oz (89.3 kg)  12/17/20 196 lb 12.8 oz (89.3 kg)  09/11/20 200 lb (90.7 kg)   Patient is in no distress; well developed, nourished and groomed; neck is supple  CARDIOVASCULAR: Examination of carotid arteries is normal; no carotid bruits Regular rate and rhythm, no murmurs Examination of peripheral vascular system by observation and palpation is normal  EYES: Ophthalmoscopic exam of optic discs and posterior segments is normal; no papilledema or hemorrhages No results found.  MUSCULOSKELETAL: Gait, strength, tone, movements noted in Neurologic exam below  NEUROLOGIC: MENTAL STATUS:      No data to display         awake, alert, oriented to person, place and time recent and remote memory intact normal attention and concentration language fluent, comprehension intact, naming intact fund of knowledge appropriate VIA INTERPRETER  CRANIAL NERVE:  2nd - no papilledema on fundoscopic exam 2nd, 3rd, 4th, 6th - pupils equal and reactive to light, visual fields full to confrontation, extraocular muscles intact, no nystagmus 5th - facial sensation symmetric 7th - facial strength symmetric 8th - hearing --> IMPAIRED; USES SIGN LANGUAGE 9th - palate elevates symmetrically, uvula midline 11th - shoulder shrug symmetric 12th - tongue protrusion midline  MOTOR:  normal bulk and tone, full strength in the BUE, BLE  SENSORY:  normal and symmetric to light touch  COORDINATION:  finger-nose-finger, fine finger movements normal  REFLEXES:  deep tendon reflexes present and symmetric  GAIT/STATION:  narrow based gait   DIAGNOSTICS/IMAGING:  Lab Results  Component Value Date   WBC 6.0 11/23/2021   HGB 13.9 11/23/2021   HCT 42.0 11/23/2021   MCV 90.3 11/23/2021   PLT 198 11/23/2021      Latest Ref Rng & Units 11/23/2021    1:32 PM 08/29/2021    3:47 PM 05/15/2018    2:38 PM  CMP   Glucose 70 - 99 mg/dL 102  95  87   BUN 6 - 20 mg/dL '10  16  14   '$ Creatinine 0.44 - 1.00 mg/dL 0.72  0.94  0.80   Sodium 135 - 145 mmol/L 141  139  142   Potassium 3.5 - 5.1 mmol/L 3.6  4.3  4.6   Chloride 98 - 111 mmol/L 109  108  99   CO2 22 - 32 mmol/L '24  25  27   '$ Calcium 8.9 - 10.3 mg/dL 8.1  8.3  9.3   Total Protein 6.5 - 8.1 g/dL 5.8  5.7  6.8   Total Bilirubin 0.3 - 1.2 mg/dL 0.4  0.8  0.3   Alkaline Phos 38 - 126 U/L 69  75  89   AST 15 - 41 U/L 26  35  19   ALT 0 - 44 U/L '23  21  15     '$ 08/09/14 CT head - nothing acute  05/19/17 CT head  - Unremarkable. No acute findings.   05/18/17 EEG  - normal  09/09/21 EEG - normal  09/23/21 LEV 5.8 L (10-40)   ASSESSMENT: 41 y.o. with multiple episodes of loss of consciousness, convulsions, tongue biting, also with out of body experience and aura prior to the spells. 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   Last seizure 07/27/13 and then 04/25/17 (in setting of ETOH and missed medication doses). Then 2021 and 2022 seizures / spells (in setting of stress, missed meds, lack of sleep).    Dx:  1. Spell of abnormal behavior  PLAN:    NON-EPILEPTIC SPELLS (seizure d/o less likely; last event possibly 8/2; triggers include stress, sleep deprivation)  - reduce levetiracetam XR '500mg'$  to 3 tabs per day; may continue to gradually reduce over timel every 1-2 months  - may consider lamotrigine in future if sxs continue, which could also help with mood stabilization  - no driving until episode free x 6 months   No orders of the defined types were placed in this encounter.  Meds ordered this encounter  Medications   levETIRAcetam (KEPPRA XR) 500 MG 24 hr tablet    Sig: Take 3 tablets (1,500 mg total) by mouth daily.    Dispense:  270 tablet    Refill:  4   Return in about 6 months (around 11/10/2022).    Penni Bombard, MD 4/70/9295, 7:47 PM Certified in Neurology, Neurophysiology and Neuroimaging  [facility] [address] [place], [place] [address] [phone]

## 2022-05-10 NOTE — Patient Instructions (Signed)
NON-EPILEPTIC SPELLS (seizure d/o less likely; last event possibly 8/2; triggers include stress, sleep deprivation)  - reduce levetiracetam XR '500mg'$  to 3 tabs per day; may continue to gradually reduce over timel every 1-2 months  - no driving until episode free x 6 months

## 2022-07-22 ENCOUNTER — Ambulatory Visit (INDEPENDENT_AMBULATORY_CARE_PROVIDER_SITE_OTHER): Payer: Medicaid Other | Admitting: Orthopaedic Surgery

## 2022-07-22 ENCOUNTER — Encounter: Payer: Self-pay | Admitting: Orthopaedic Surgery

## 2022-07-22 DIAGNOSIS — G8929 Other chronic pain: Secondary | ICD-10-CM

## 2022-07-22 DIAGNOSIS — M25562 Pain in left knee: Secondary | ICD-10-CM | POA: Diagnosis not present

## 2022-07-22 NOTE — Progress Notes (Signed)
Office Visit Note   Patient: Mercedes Parks           Date of Birth: [birth year]           MRN: [id number] Visit Date: 07/22/2022              Requested by: Denny Levy, [place],  [place] [address] PCP: Denny Levy, [place]   Assessment & Plan: Visit Diagnoses:  1. Chronic pain of left knee     Plan: Impression is chronic left knee pain questionable lateral meniscus tear.  Now that her cochlear implant has been removed we can order an MRI to better assess for this.  Follow-up after the MRI.  Sign language interpreter present today which increased complexity.  Follow-Up Instructions: No follow-ups on file.   Orders:  Orders Placed This Encounter  Procedures   MR Knee Left w/o contrast   No orders of the defined types were placed in this encounter.     Procedures: No procedures performed   Clinical Data: No additional findings.   Subjective: Chief Complaint  Patient presents with   Left Knee - Pain    HPI [name] returns today for recurrent left knee pain.  She is here with sign language interpreter.  Pain is worse with activity.  She had a brief period in which her symptoms were much better.  She recently had a cochlear implant removed so she cannot have an MRI.  She continues to report swelling and pain along the lateral side of the knee.  Review of Systems   Objective: Vital Signs: There were no vitals taken for this visit.  Physical Exam  Ortho Exam Lamination of left knee shows a trace effusion.  Lateral joint line tenderness.  Mild pain with flexion of the knee. Specialty Comments:  No specialty comments available.  Imaging: No results found.   PMFS History: Patient Active Problem List   Diagnosis Date Noted   Seizure ([place]) 11/23/2021   Gastroesophageal reflux disease 12/17/2020   Food intolerance 12/17/2020   Panniculitis 09/30/2020   Insect bite 06/26/2018   Urticaria 01/24/2018   Seasonal and perennial allergic rhinitis  01/24/2018   Allergic conjunctivitis 01/24/2018   Coughing 01/24/2018   Fatigue 04/05/2016   Hypersomnia with sleep apnea 04/05/2016   Snoring 04/05/2016   Hearing loss sensory, bilateral 04/05/2016   Constipation 07/14/2015   Abdominal pain 07/14/2015   Generalized idiopathic epilepsy, not intractable, without status epilepticus ([place]) 02/03/2015   Past Medical History:  Diagnosis Date   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   Seizure ([place])    04/28/22    Family History  Problem Relation Age of Onset   Healthy Mother    Healthy Father    Colon cancer Maternal Grandfather    Breast cancer Paternal Grandmother    Other Paternal Grandmother        Myles Lipps disease   Seizures Neg Hx     Past Surgical History:  Procedure Laterality Date   ABDOMINAL HYSTERECTOMY  2011   CESAREAN SECTION     @ age 20   [place]     @ age 27   [place]  12/2020   removed 01/08/22   GASTRIC BYPASS  07/05/2016   PANNICULECTOMY  10/12/2017   Dr. Kellie Simmering   TONSILECTOMY, ADENOIDECTOMY, BILATERAL MYRINGOTOMY AND TUBES     Social History   Occupational History    Comment: n/a-unemployed  Tobacco Use   Smoking status: Former    Types: Cigarettes    Quit date: 09/27/2012    Years since quitting: 9.8   Smokeless tobacco: Never  Vaping Use   Vaping Use: Never used  Substance and Sexual Activity   Alcohol use: No    Alcohol/week: 0.0 standard drinks of alcohol    Comment: quit 11/2010   Drug use: No    Comment: marijuana; quit 3/ 2012   Sexual activity: Not on file

## 2022-08-02 ENCOUNTER — Encounter: Payer: Self-pay | Admitting: Orthopaedic Surgery

## 2022-08-15 ENCOUNTER — Ambulatory Visit
Admission: RE | Admit: 2022-08-15 | Discharge: 2022-08-15 | Disposition: A | Discharge status: Home / Self Care | Payer: Medicaid Other | Source: Ambulatory Visit | Attending: Orthopaedic Surgery | Admitting: Orthopaedic Surgery

## 2022-08-15 DIAGNOSIS — G8929 Other chronic pain: Secondary | ICD-10-CM

## 2022-08-17 ENCOUNTER — Encounter: Payer: Self-pay | Admitting: Orthopaedic Surgery

## 2022-08-17 ENCOUNTER — Ambulatory Visit (INDEPENDENT_AMBULATORY_CARE_PROVIDER_SITE_OTHER): Payer: Medicaid Other | Admitting: Orthopaedic Surgery

## 2022-08-17 DIAGNOSIS — G8929 Other chronic pain: Secondary | ICD-10-CM | POA: Diagnosis not present

## 2022-08-17 DIAGNOSIS — M25562 Pain in left knee: Secondary | ICD-10-CM | POA: Diagnosis not present

## 2022-08-17 NOTE — Progress Notes (Signed)
Office Visit Note   Patient: Mercedes Parks           Date of Birth: 01/04/1981           MRN: [id number] Visit Date: 08/17/2022              Requested by: Denny Levy, [place],  [place] [address] PCP: Denny Levy, [place]   Assessment & Plan: Visit Diagnoses:  1. Chronic pain of left knee     Plan: MRI scan reviewed with the patient which shows horizontal tear of the medial meniscus and intrasubstance degeneration of the lateral meniscus without a definite tear.  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  Also has poor patellofemoral tracking with TT-TG distance of 17 mm.  Based on these findings treatment options were reviewed and she would like to go to outpatient physical therapy for strengthening.  We discussed that knee arthroscopy with lateral release is an option down the road if her symptoms do not improve.  Sign language interpreter was present today which increased complexity of the visit.  Follow-Up Instructions: No follow-ups on file.   Orders:  Orders Placed This Encounter  Procedures   Ambulatory referral to Physical Therapy   No orders of the defined types were placed in this encounter.     Procedures: No procedures performed   Clinical Data: No additional findings.   Subjective: Chief Complaint  Patient presents with   Left Knee - Follow-up    MRI review    HPI Mercedes Parks returns today to discuss left knee MRI scan.  Symptoms continue to be more on the lateral side of the knee.  Review of Systems  Constitutional: Negative.   HENT: Negative.    Eyes: Negative.   Respiratory: Negative.    Cardiovascular: Negative.   Endocrine: Negative.   Musculoskeletal: Negative.   Neurological: Negative.   Hematological: Negative.   Psychiatric/Behavioral: Negative.    All other systems reviewed and are negative.    Objective: Vital Signs: There were no vitals taken for this visit.  Physical  Exam Vitals and nursing note reviewed.  Constitutional:      Appearance: She is well-developed.  Pulmonary:     Effort: Pulmonary effort is normal.  Skin:    General: Skin is warm.     Capillary Refill: Capillary refill takes less than 2 seconds.  Neurological:     Mental Status: She is alert and oriented to person, place, and time.  Psychiatric:        Behavior: Behavior normal.        Thought Content: Thought content normal.        Judgment: Judgment normal.     Ortho Exam Examination of the left knee shows no medial joint line tenderness.  Slight discomfort with palpation along the lateral patellofemoral facet.  Specialty Comments:  No specialty comments available.  Imaging: No results found.   PMFS History: Patient Active Problem List   Diagnosis Date Noted   Seizure ([place]) 11/23/2021   Gastroesophageal reflux disease 12/17/2020   Food intolerance 12/17/2020   Panniculitis 09/30/2020   Insect bite 06/26/2018   Urticaria 01/24/2018   Seasonal and perennial allergic rhinitis 01/24/2018   Allergic conjunctivitis 01/24/2018   Coughing 01/24/2018   Fatigue 04/05/2016   Hypersomnia with sleep apnea 04/05/2016   Snoring 04/05/2016   Hearing loss sensory, bilateral 04/05/2016   Constipation 07/14/2015   Abdominal pain 07/14/2015   Generalized idiopathic epilepsy, not intractable,  without status epilepticus ([place]) 02/03/2015   Past Medical History:  Diagnosis Date   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   Seizure ([place])    04/28/22    Family History  Problem Relation Age of Onset   Healthy Mother    Healthy Father    Colon cancer Maternal Grandfather    Breast cancer Paternal Grandmother    Other Paternal Grandmother        Myles Lipps disease   Seizures Neg Hx     Past Surgical History:  Procedure Laterality Date   ABDOMINAL HYSTERECTOMY  2011   CESAREAN SECTION     @ age 44   [place]     @ age 35   COCHLEAR IMPLANT   12/2020   removed 01/08/22   GASTRIC BYPASS  07/05/2016   PANNICULECTOMY  10/12/2017   Dr. Kellie Simmering   TONSILECTOMY, ADENOIDECTOMY, BILATERAL MYRINGOTOMY AND TUBES     Social History   Occupational History    Comment: n/a-unemployed  Tobacco Use   Smoking status: Former    Types: Cigarettes    Quit date: 09/27/2012    Years since quitting: 9.8   Smokeless tobacco: Never  Vaping Use   Vaping Use: Never used  Substance and Sexual Activity   Alcohol use: No    Alcohol/week: 0.0 standard drinks of alcohol    Comment: quit 11/2010   Drug use: No    Comment: marijuana; quit 3/ 2012   Sexual activity: Not on file

## 2022-09-01 ENCOUNTER — Other Ambulatory Visit: Payer: Self-pay

## 2022-09-01 ENCOUNTER — Ambulatory Visit: Payer: Medicaid Other | Attending: Orthopaedic Surgery | Admitting: Physical Therapy

## 2022-09-01 ENCOUNTER — Encounter: Payer: Self-pay | Admitting: Physical Therapy

## 2022-09-01 DIAGNOSIS — M25561 Pain in right knee: Secondary | ICD-10-CM | POA: Diagnosis present

## 2022-09-01 DIAGNOSIS — G8929 Other chronic pain: Secondary | ICD-10-CM | POA: Insufficient documentation

## 2022-09-01 DIAGNOSIS — M25562 Pain in left knee: Secondary | ICD-10-CM | POA: Diagnosis present

## 2022-09-01 DIAGNOSIS — M25551 Pain in right hip: Secondary | ICD-10-CM | POA: Diagnosis present

## 2022-09-01 DIAGNOSIS — M6281 Muscle weakness (generalized): Secondary | ICD-10-CM | POA: Insufficient documentation
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
## 2022-09-07 NOTE — Therapy (Signed)
OUTPATIENT PHYSICAL THERAPY TREATMENT NOTE   Patient Name: Mercedes Parks MRN: [id number] DOB:1981-04-29, 41 y.o., female Today's Date: 09/08/2022  PCP: Denny Levy, [place]  REFERRING PROVIDER: Leandrew Koyanagi, MD   END OF SESSION:   PT End of Session - 09/08/22 1033     Visit Number 2    Date for PT Re-Evaluation 10/27/22    Authorization Type [facility] [place] - LEFS    PT Start Time 1045    PT Stop Time 1125    PT Time Calculation (min) 40 min    Activity Tolerance Patient tolerated treatment well    Behavior During Therapy WFL for tasks assessed/performed             Past Medical History:  Diagnosis Date   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 Kellie Simmering   TONSILECTOMY, ADENOIDECTOMY, BILATERAL MYRINGOTOMY AND TUBES     Patient Active Problem List   Diagnosis Date Noted   Seizure ([place]) 11/23/2021   Gastroesophageal reflux disease 12/17/2020   Food intolerance 12/17/2020   Panniculitis 09/30/2020   Insect bite 06/26/2018   Urticaria 01/24/2018   Seasonal and perennial allergic rhinitis 01/24/2018   Allergic conjunctivitis 01/24/2018   Coughing 01/24/2018   Fatigue 04/05/2016   Hypersomnia with sleep apnea 04/05/2016   Snoring 04/05/2016   Hearing loss sensory, bilateral 04/05/2016   Constipation 07/14/2015   Abdominal pain 07/14/2015   Generalized idiopathic epilepsy, not intractable, without status epilepticus ([place]) 02/03/2015    REFERRING DIAG: Chronic pain of left knee [M25.562, G89.29]    THERAPY DIAG:  Chronic pain of left knee  Muscle weakness (generalized)  Rationale for Evaluation and Treatment Rehabilitation  PERTINENT HISTORY:  Epilepsy, seizure (July 2023)   PRECAUTIONS: None  SUBJECTIVE:                                                                                                                                                                                      SUBJECTIVE STATEMENT:  Patient reports swelling in L knee and that her pain is at a 6/10.   PAIN:  Are you having pain? Yes Pain location: lateral joint line NPRS scale:  6/10 Aggravating factors: walking, cleaning, standing Relieving factors: rest  Pain description: constant and aching Stage: Chronic Stability: staying the same 24 hour pattern: NA    OBJECTIVE: (objective measures completed at initial evaluation unless otherwise dated)   DIAGNOSTIC FINDINGS:  Nondisplaced horizontal tear at the posterior horn-body junction of the medial meniscus with vertical extension into the medial meniscal body.   Areas of moderate focal chondrosis along the trochlea and lateral tibial plateau.   Small joint effusion.   Mild findings of patellar tendon-lateral femoral condyle impingement syndrome.             GENERAL OBSERVATION/GAIT:                     R weight shift.  Moderate toe out bil   SENSATION:          Light touch: Appears intact   PALPATION: Tenderness lateral joint line   MUSCLE LENGTH: Hamstrings: Right no restriction; Left no restriction ASLR: Right ASLR = PSLR; Left ASLR = PSLR Thomas test: Right no restriction; Left no restriction Ely's test: Right subtle restriction; Left significant restriction   LE MMT:   MMT Right 09/01/2022 Left 09/01/2022  Hip flexion (L2, L3) 4 3+  Knee extension (L3) 4 3+*  Knee flexion 4 3+**  Hip abduction 3+ 3+  Hip extension 4+ 4+  Hip external rotation      Hip internal rotation      Hip adduction      Ankle dorsiflexion (L4)      Ankle plantarflexion (S1)      Ankle inversion      Ankle eversion      Great Toe ext (L5)      Grossly        (Blank rows = not tested, score  listed is out of 5 possible points.  N = WNL, D = diminished, C = clear for gross weakness with myotome testing, * = concordant pain with testing)   LE ROM:   ROM Right 09/01/2022 Left 09/01/2022  Hip flexion      Hip extension      Hip abduction      Hip adduction      Hip internal rotation      Hip external rotation      Knee flexion WNL 120  Knee extension      Ankle dorsiflexion      Ankle plantarflexion      Ankle inversion      Ankle eversion        (Blank rows = not tested, N = WNL, * = concordant pain with testing)   Functional Tests   Eval (09/01/2022)                                                                                                            PATIENT SURVEYS:  LEFS 47 pts     TODAY'S TREATMENT: Creating, reviewing, and completing below HEP     PATIENT EDUCATION:  POC, diagnosis, prognosis, HEP, and outcome measures.  Pt educated via explanation, demonstration, and handout (HEP).  Pt  confirms understanding verbally.    HOME EXERCISE PROGRAM: Access Code: [facility] URL: [email or web address] Date: 09/01/2022 Prepared by: Shearon Balo   Exercises - Supine Quadriceps Stretch with Strap on Table  - 2 x daily - 7 x weekly - 1 sets - 3 reps - 30 - 45 seconds hold - Supine Quad Set  - 3 x daily - 7 x weekly - 2 sets - 10 reps - 10 hold - Active Straight Leg Raise with Quad Set  - 1 x daily - 7 x weekly - 3 sets - 10 reps   ASTERISK SIGNS     Asterisk Signs Eval (09/01/2022)            L knee flexion in prone 70 degrees            L knee ext strength 3+ w/ pain            Max pain  9/10                                            TREATMENT 12/13: Therapeutic Exercise: - Nustep level 6 x 5 mins - Standing hip flexion/abduction/extension 2x10 each Lt only (too painful to stand on Lt to do Rt) - Heel/toe raise 2x15  - Slant board gastroc stretch x2' - Omega knee extension 5# 2x10 BIL - Omega knee flexion 45# 2x10  BIL - Supine SLR with QS 2x10 Lt - Sidelying hip abduction 2x10 Lt - Bridges 2x10 - Thomas stretch EOM Lt with strap 2x1'    ASSESSMENT:   CLINICAL IMPRESSION: Patient presents to first follow up session reporting continued pain and swelling in the Lt knee as well as HEP compliance. Session today focused on proximal hip and knee strengthening as well as stretching for quads and hip flexors. She was not able to do standing exercises weight bearing solely on LLE due to increased pain in the knee. Patient continues to benefit from skilled PT services and should be progressed as able to improve functional independence.    OBJECTIVE IMPAIRMENTS: Pain, restricted RF length, knee strength   ACTIVITY LIMITATIONS: standing, walking, squatting, housework   PERSONAL FACTORS: See medical history and pertinent history     REHAB POTENTIAL: Good   CLINICAL DECISION MAKING: Stable/uncomplicated   EVALUATION COMPLEXITY: Low     GOALS:     SHORT TERM GOALS: Target date: 09/29/2022   [place] will be >75% HEP compliant to improve carryover between sessions and facilitate independent management of condition   Evaluation (09/01/2022): ongoing Goal status: INITIAL     LONG TERM GOALS: Target date: 10/27/2022   Aprile will show a >/= 18 pt improvement in LEFS score (MCID is 9 pts) as a proxy for functional improvement    Evaluation/Baseline (09/01/2022): 47 pts Goal status: INITIAL     2.  Alanii will achieve 100 degrees knee flexion in prone to improve ability to complete transfers and navigate steps   Evaluation/Baseline (09/01/2022): 70 degrees Goal status: INITIAL       3.  Anyi will improve the following MMTs to >/= 4+/5 to show improvement in strength:  knee ext and flexion    Evaluation/Baseline (09/01/2022):    LE MMT:   MMT Right 09/01/2022 Left 09/01/2022  Hip flexion (L2, L3) 4 3+  Knee extension (L3) 4 3+*  Knee flexion 4 3+**  Hip abduction 3+ 3+  Hip  extension 4+ 4+   Hip external rotation      Hip internal rotation      Hip adduction      Ankle dorsiflexion (L4)      Ankle plantarflexion (S1)      Ankle inversion      Ankle eversion      Great Toe ext (L5)      Grossly        (Blank rows = not tested, score listed is out of 5 possible points.  N = WNL, D = diminished, C = clear for gross weakness with myotome testing, * = concordant pain with testing)   Goal status: INITIAL     4.  Tacie will self report >/= 50% decrease in pain from evaluation    Evaluation/Baseline (09/01/2022): 9/10 max pain Goal status: INITIAL       PLAN: PT FREQUENCY: 1-2x/week   PT DURATION: 8 weeks (Ending 10/27/2022)   PLANNED INTERVENTIONS: Therapeutic exercises, Aquatic therapy, Therapeutic activity, Neuro Muscular re-education, Gait training, Patient/Family education, Joint mobilization, Dry Needling, Electrical stimulation, Spinal mobilization and/or manipulation, Moist heat, Taping, Vasopneumatic device, Ionotophoresis '4mg'$ /ml Dexamethasone, and Manual therapy   PLAN FOR NEXT SESSION: progressive knee strengthening, RF stretching (LMargarette [place], PTA 09/08/2022, 10:33 AM

## 2022-09-08 ENCOUNTER — Ambulatory Visit: Payer: Medicaid Other

## 2022-09-08 DIAGNOSIS — G8929 Other chronic pain: Secondary | ICD-10-CM

## 2022-09-08 DIAGNOSIS — M25562 Pain in left knee: Secondary | ICD-10-CM | POA: Diagnosis not present

## 2022-09-08 DIAGNOSIS — M6281 Muscle weakness (generalized): Secondary | ICD-10-CM

## 2022-09-10 ENCOUNTER — Encounter: Payer: Self-pay | Admitting: Physical Therapy

## 2022-09-10 ENCOUNTER — Ambulatory Visit: Payer: Medicaid Other | Admitting: Physical Therapy

## 2022-09-10 DIAGNOSIS — M25562 Pain in left knee: Secondary | ICD-10-CM | POA: Diagnosis not present

## 2022-09-10 DIAGNOSIS — G8929 Other chronic pain: Secondary | ICD-10-CM

## 2022-09-10 DIAGNOSIS — M6281 Muscle weakness (generalized): Secondary | ICD-10-CM

## 2022-09-10 DIAGNOSIS — M25551 Pain in right hip: Secondary | ICD-10-CM

## 2022-09-10 NOTE — Therapy (Signed)
OUTPATIENT PHYSICAL THERAPY TREATMENT NOTE   Patient Name: Mercedes Parks MRN: [id number] DOB:[birth year], 41 y.o., female Today's Date: 09/10/2022  PCP: Denny Levy, [place]  REFERRING PROVIDER: Leandrew Koyanagi, MD   END OF SESSION:   PT End of Session - 09/10/22 0819     Visit Number 3    Date for PT Re-Evaluation 10/27/22    Authorization Type [facility] [place] - LEFS    PT Start Time 0830    PT Stop Time 0910    PT Time Calculation (min) 40 min    Activity Tolerance Patient tolerated treatment well    Behavior During Therapy [facility] for tasks assessed/performed             Past Medical History:  Diagnosis Date   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 Kellie Simmering   TONSILECTOMY, ADENOIDECTOMY, BILATERAL MYRINGOTOMY AND TUBES     Patient Active Problem List   Diagnosis Date Noted   Seizure ([place]) 11/23/2021   Gastroesophageal reflux disease 12/17/2020   Food intolerance 12/17/2020   Panniculitis 09/30/2020   Insect bite 06/26/2018   Urticaria 01/24/2018   Seasonal and perennial allergic rhinitis 01/24/2018   Allergic conjunctivitis 01/24/2018   Coughing 01/24/2018   Fatigue 04/05/2016   Hypersomnia with sleep apnea 04/05/2016   Snoring 04/05/2016   Hearing loss sensory, bilateral 04/05/2016   Constipation 07/14/2015   Abdominal pain 07/14/2015   Generalized idiopathic epilepsy, not intractable, without status epilepticus ([place]) 02/03/2015    REFERRING DIAG: Chronic pain of left knee [M25.562, G89.29]    THERAPY DIAG:  Chronic pain of left knee  Muscle weakness (generalized)  Chronic pain of right knee  Pain in right hip  Rationale for Evaluation and  Treatment Rehabilitation  PERTINENT HISTORY: Epilepsy, seizure (July 2023)   PRECAUTIONS: None  SUBJECTIVE:                                                                                                                                                                                      SUBJECTIVE STATEMENT:  Pt reports that she is having minimal pain today.   PAIN:  Are you having pain? Yes Pain location: lateral joint line NPRS scale:  1/10 Aggravating factors:  walking, cleaning, standing Relieving factors: rest Pain description: constant and aching Stage: Chronic Stability: staying the same 24 hour pattern: NA    OBJECTIVE: (objective measures completed at initial evaluation unless otherwise dated)   DIAGNOSTIC FINDINGS:  Nondisplaced horizontal tear at the posterior horn-body junction of the medial meniscus with vertical extension into the medial meniscal body.   Areas of moderate focal chondrosis along the trochlea and lateral tibial plateau.   Small joint effusion.   Mild findings of patellar tendon-lateral femoral condyle impingement syndrome.             GENERAL OBSERVATION/GAIT:                     R weight shift.  Moderate toe out bil   SENSATION:          Light touch: Appears intact   PALPATION: Tenderness lateral joint line   MUSCLE LENGTH: Hamstrings: Right no restriction; Left no restriction ASLR: Right ASLR = PSLR; Left ASLR = PSLR Thomas test: Right no restriction; Left no restriction Ely's test: Right subtle restriction; Left significant restriction   LE MMT:   MMT Right 09/01/2022 Left 09/01/2022  Hip flexion (L2, L3) 4 3+  Knee extension (L3) 4 3+*  Knee flexion 4 3+**  Hip abduction 3+ 3+  Hip extension 4+ 4+  Hip external rotation      Hip internal rotation      Hip adduction      Ankle dorsiflexion (L4)      Ankle plantarflexion (S1)      Ankle inversion      Ankle eversion      Great Toe ext (L5)      Grossly         (Blank rows = not tested, score listed is out of 5 possible points.  N = WNL, D = diminished, C = clear for gross weakness with myotome testing, * = concordant pain with testing)   LE ROM:   ROM Right 09/01/2022 Left 09/01/2022  Hip flexion      Hip extension      Hip abduction      Hip adduction      Hip internal rotation      Hip external rotation      Knee flexion WNL 120  Knee extension      Ankle dorsiflexion      Ankle plantarflexion      Ankle inversion      Ankle eversion        (Blank rows = not tested, N = WNL, * = concordant pain with testing)   Functional Tests   Eval (09/01/2022)                                                                                                            PATIENT SURVEYS:  LEFS 47 pts     TODAY'S TREATMENT: Creating, reviewing, and completing below HEP     PATIENT EDUCATION:  POC, diagnosis, prognosis, HEP, and outcome measures.  Pt educated via explanation,  demonstration, and handout (HEP).  Pt confirms understanding verbally.    HOME EXERCISE PROGRAM: Access Code: [facility] URL: [email or web address] Date: 09/10/2022 Prepared by: Shearon Balo  Exercises - Supine Quadriceps Stretch with Strap on Table  - 2 x daily - 7 x weekly - 1 sets - 3 reps - 30 - 45 seconds hold - Active Straight Leg Raise with Quad Set  - 1 x daily - 7 x weekly - 3 sets - 10 reps - Staggered Bridge  - 1 x daily - 7 x weekly - 3 sets - 10 reps - Side Plank on Knees  - 1 x daily - 7 x weekly - 1 sets - 4 reps - 5 second hold - Seated Knee Extension with Resistance  - 1 x daily - 7 x weekly - 3 sets - 10 reps   ASTERISK SIGNS     Asterisk Signs Eval (09/01/2022) 12/15           L knee flexion in prone 70 degrees  symmetrical and full ROM in prone           L knee ext strength 3+ w/ pain            Max pain  9/10                                            TREATMENT 12/15: Therapeutic Exercise: - Nustep level 7 x 5  mins - Heel/toe raise 3x15 on 4'' step - Slant board gastroc stretch x2' - prone knee flexion stretch - 29'' x3 - fire hydrant - 2x10 ea - black TB - knee ext machine - SL - 4x5 - knee ext with band - Bridges x10  - Staggered - 2x10 ea    ASSESSMENT:   CLINICAL IMPRESSION: Mercedes Parks tolerated session well with no adverse reaction.  Her prone RF length has improved to full.  She shows L > R lateral hip weakness which should continue to be addressed.  Tolerated OC strengthening well fatigue, but no increase in pain.  HEP updated.  Integrated balance as appropriate.   OBJECTIVE IMPAIRMENTS: Pain, restricted RF length, knee strength   ACTIVITY LIMITATIONS: standing, walking, squatting, housework   PERSONAL FACTORS: See medical history and pertinent history     REHAB POTENTIAL: Good   CLINICAL DECISION MAKING: Stable/uncomplicated   EVALUATION COMPLEXITY: Low     GOALS:     SHORT TERM GOALS: Target date: 09/29/2022   Mercedes Parks will be >75% HEP compliant to improve carryover between sessions and facilitate independent management of condition   Evaluation (09/01/2022): ongoing Goal status: INITIAL     LONG TERM GOALS: Target date: 10/27/2022   Mercedes Parks will show a >/= 18 pt improvement in LEFS score (MCID is 9 pts) as a proxy for functional improvement    Evaluation/Baseline (09/01/2022): 47 pts Goal status: INITIAL     2.  Mercedes Parks will achieve 100 degrees knee flexion in prone to improve ability to complete transfers and navigate steps   Evaluation/Baseline (09/01/2022): 70 degrees Goal status: INITIAL       3.  Mercedes Parks will improve the following MMTs to >/= 4+/5 to show improvement in strength:  knee ext and flexion    Evaluation/Baseline (09/01/2022):    LE MMT:   MMT Right 09/01/2022 Left 09/01/2022  Hip flexion (L2, L3) 4 3+  Knee extension (L3) 4 3+*  Knee flexion 4 3+**  Hip abduction 3+ 3+  Hip extension 4+ 4+  Hip external rotation      Hip internal rotation       Hip adduction      Ankle dorsiflexion (L4)      Ankle plantarflexion (S1)      Ankle inversion      Ankle eversion      Great Toe ext (L5)      Grossly        (Blank rows = not tested, score listed is out of 5 possible points.  N = WNL, D = diminished, C = clear for gross weakness with myotome testing, * = concordant pain with testing)   Goal status: INITIAL     4.  Mercedes Parks will self report >/= 50% decrease in pain from evaluation    Evaluation/Baseline (09/01/2022): 9/10 max pain Goal status: INITIAL       PLAN: PT FREQUENCY: 1-2x/week   PT DURATION: 8 weeks (Ending 10/27/2022)   PLANNED INTERVENTIONS: Therapeutic exercises, Aquatic therapy, Therapeutic activity, Neuro Muscular re-education, Gait training, Patient/Family education, Joint mobilization, Dry Needling, Electrical stimulation, Spinal mobilization and/or manipulation, Moist heat, Taping, Vasopneumatic device, Ionotophoresis '4mg'$ /ml Dexamethasone, and Manual therapy   PLAN FOR NEXT SESSION: progressive knee strengthening, RF stretching (L)   Mathis Dad, PT 09/10/2022, 9:16 AM

## 2022-09-13 ENCOUNTER — Encounter: Payer: Self-pay | Admitting: Physical Therapy

## 2022-09-13 ENCOUNTER — Ambulatory Visit: Payer: Medicaid Other | Admitting: Physical Therapy

## 2022-09-13 DIAGNOSIS — M6281 Muscle weakness (generalized): Secondary | ICD-10-CM

## 2022-09-13 DIAGNOSIS — M25551 Pain in right hip: Secondary | ICD-10-CM

## 2022-09-13 DIAGNOSIS — G8929 Other chronic pain: Secondary | ICD-10-CM

## 2022-09-13 DIAGNOSIS — M25562 Pain in left knee: Secondary | ICD-10-CM | POA: Diagnosis not present

## 2022-09-13 NOTE — Therapy (Signed)
OUTPATIENT PHYSICAL THERAPY TREATMENT NOTE   Patient Name: Mercedes Parks MRN: [id number] DOB:03/29/1981, 41 y.o., female Today's Date: 09/13/2022  PCP: Denny Levy, [place]  REFERRING PROVIDER: Leandrew Koyanagi, MD   END OF SESSION:   PT End of Session - 09/13/22 0831     Visit Number 4    Date for PT Re-Evaluation 10/27/22    Authorization Type [facility] [place] - LEFS    PT Start Time 0830    PT Stop Time 0911    PT Time Calculation (min) 41 min    Activity Tolerance Patient tolerated treatment well    Behavior During Therapy [facility] for tasks assessed/performed             Past Medical History:  Diagnosis Date   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 Kellie Simmering   TONSILECTOMY, ADENOIDECTOMY, BILATERAL MYRINGOTOMY AND TUBES     Patient Active Problem List   Diagnosis Date Noted   Seizure ([place]) 11/23/2021   Gastroesophageal reflux disease 12/17/2020   Food intolerance 12/17/2020   Panniculitis 09/30/2020   Insect bite 06/26/2018   Urticaria 01/24/2018   Seasonal and perennial allergic rhinitis 01/24/2018   Allergic conjunctivitis 01/24/2018   Coughing 01/24/2018   Fatigue 04/05/2016   Hypersomnia with sleep apnea 04/05/2016   Snoring 04/05/2016   Hearing loss sensory, bilateral 04/05/2016   Constipation 07/14/2015   Abdominal pain 07/14/2015   Generalized idiopathic epilepsy, not intractable, without status epilepticus ([place]) 02/03/2015    REFERRING DIAG: Chronic pain of left knee [M25.562, G89.29]    THERAPY DIAG:  Chronic pain of left knee  Muscle weakness (generalized)  Chronic pain of right knee  Pain in right hip  Rationale for Evaluation and  Treatment Rehabilitation  PERTINENT HISTORY: Epilepsy, seizure (July 2023)   PRECAUTIONS: None  SUBJECTIVE:                                                                                                                                                                                      SUBJECTIVE STATEMENT:  Pt states that her knee is feeling much better.   PAIN:  Are you having pain? Yes Pain location: lateral joint line NPRS scale:  1/10 Aggravating factors:  walking, cleaning, standing Relieving factors: rest Pain description: constant and aching Stage: Chronic Stability: staying the same 24 hour pattern: NA    OBJECTIVE: (objective measures completed at initial evaluation unless otherwise dated)   DIAGNOSTIC FINDINGS:  Nondisplaced horizontal tear at the posterior horn-body junction of the medial meniscus with vertical extension into the medial meniscal body.   Areas of moderate focal chondrosis along the trochlea and lateral tibial plateau.   Small joint effusion.   Mild findings of patellar tendon-lateral femoral condyle impingement syndrome.             GENERAL OBSERVATION/GAIT:                     R weight shift.  Moderate toe out bil   SENSATION:          Light touch: Appears intact   PALPATION: Tenderness lateral joint line   MUSCLE LENGTH: Hamstrings: Right no restriction; Left no restriction ASLR: Right ASLR = PSLR; Left ASLR = PSLR Thomas test: Right no restriction; Left no restriction Ely's test: Right subtle restriction; Left significant restriction   LE MMT:   MMT Right 09/01/2022 Left 09/01/2022  Hip flexion (L2, L3) 4 3+  Knee extension (L3) 4 3+*  Knee flexion 4 3+**  Hip abduction 3+ 3+  Hip extension 4+ 4+  Hip external rotation      Hip internal rotation      Hip adduction      Ankle dorsiflexion (L4)      Ankle plantarflexion (S1)      Ankle inversion      Ankle eversion      Great Toe ext (L5)      Grossly        (Blank  rows = not tested, score listed is out of 5 possible points.  N = WNL, D = diminished, C = clear for gross weakness with myotome testing, * = concordant pain with testing)   LE ROM:   ROM Right 09/01/2022 Left 09/01/2022  Hip flexion      Hip extension      Hip abduction      Hip adduction      Hip internal rotation      Hip external rotation      Knee flexion WNL 120  Knee extension      Ankle dorsiflexion      Ankle plantarflexion      Ankle inversion      Ankle eversion        (Blank rows = not tested, N = WNL, * = concordant pain with testing)   Functional Tests   Eval (09/01/2022)                                                                                                            PATIENT SURVEYS:  LEFS 47 pts     TODAY'S TREATMENT: Creating, reviewing, and completing below HEP     PATIENT EDUCATION:  POC, diagnosis, prognosis, HEP, and outcome measures.  Pt educated via explanation,  demonstration, and handout (HEP).  Pt confirms understanding verbally.    HOME EXERCISE PROGRAM: Access Code: [facility] URL: [email or web address] Date: 09/13/2022 Prepared by: Shearon Balo  Exercises - Figure 4 Bridge  - 1 x daily - 7 x weekly - 3 sets - 10 reps - Side Plank on Knees  - 1 x daily - 7 x weekly - 1 sets - 4 reps - 5 second hold - Seated Knee Extension with Resistance  - 1 x daily - 7 x weekly - 3 sets - 10 reps - Sidelying Hip Abduction  - 1 x daily - 7 x weekly - 3 sets - 10 reps   ASTERISK SIGNS     Asterisk Signs Eval (09/01/2022) 12/15           L knee flexion in prone 70 degrees  symmetrical and full ROM in prone           L knee ext strength 3+ w/ pain            Max pain  9/10                                            TREATMENT 12/18: Therapeutic Exercise: - Nustep level 6 x 5 mins - no UE - S/L - Heel raise 3x10 w/ UE support - Slant board gastroc stretch x2' - S/L hip abd - 2x10 ea - side plank from knees -  2x10 - partial ROM squat - cues to avoid excessive hip flexion - knee ext machine - SL - 4x6 - 15# - HS curl - 35# - 3x10 - bil - Bridges with march - 3x10    ASSESSMENT:   CLINICAL IMPRESSION: [name] is making good progress toward goals with improved strength, improved pain, and ability to complete ADLs with less difficulty.  See goals section for details.   OBJECTIVE IMPAIRMENTS: Pain, restricted RF length, knee strength   ACTIVITY LIMITATIONS: standing, walking, squatting, housework   PERSONAL FACTORS: See medical history and pertinent history     REHAB POTENTIAL: Good   CLINICAL DECISION MAKING: Stable/uncomplicated   EVALUATION COMPLEXITY: Low     GOALS:     SHORT TERM GOALS: Target date: 09/29/2022   Elain will be >75% HEP compliant to improve carryover between sessions and facilitate independent management of condition   Evaluation (09/01/2022): ongoing Goal status: INITIAL     LONG TERM GOALS: Target date: 10/27/2022   [place] will show a >/= 18 pt improvement in LEFS score (MCID is 9 pts) as a proxy for functional improvement    Evaluation/Baseline (09/01/2022): 47 pts Goal status: INITIAL     2.  Leonard will achieve 100 degrees knee flexion in prone to improve ability to complete transfers and navigate steps   Evaluation/Baseline (09/01/2022): 70 degrees 12/18: Full Goal status: MET       3.  [place]a will improve the following MMTs to >/= 4+/5 to show improvement in strength:  knee ext and flexion    Evaluation/Baseline (09/01/2022):    LE MMT:   MMT Right 09/01/2022 Left 09/01/2022 R 12/8 L 12/8  Hip flexion (L2, L3) 4 3+  4  Knee extension (L3) 4 3+*  4  Knee flexion 4 3+**  4  Hip abduction 3+ 3+ 4 3+  Hip extension 4+ 4+    Hip external rotation  Hip internal rotation        Hip adduction        Ankle dorsiflexion (L4)        Ankle plantarflexion (S1)        Ankle inversion        Ankle eversion        Great Toe ext (L5)         Grossly          (Blank rows = not tested, score listed is out of 5 possible points.  N = WNL, D = diminished, C = clear for gross weakness with myotome testing, * = concordant pain with testing)   Goal status: INITIAL     4.  Cece will self report >/= 50% decrease in pain from evaluation    Evaluation/Baseline (09/01/2022): 9/10 max pain 12/18: MET Goal status: MET       PLAN: PT FREQUENCY: 1-2x/week   PT DURATION: 8 weeks (Ending 10/27/2022)   PLANNED INTERVENTIONS: Therapeutic exercises, Aquatic therapy, Therapeutic activity, Neuro Muscular re-education, Gait training, Patient/Family education, Joint mobilization, Dry Needling, Electrical stimulation, Spinal mobilization and/or manipulation, Moist heat, Taping, Vasopneumatic device, Ionotophoresis 69m/ml Dexamethasone, and Manual therapy   PLAN FOR NEXT SESSION: progressive knee strengthening, RF stretching (L)   KMathis Dad PT 09/13/2022, 9:15 AM

## 2022-09-15 ENCOUNTER — Ambulatory Visit: Payer: Medicaid Other | Admitting: Physical Therapy

## 2022-09-15 ENCOUNTER — Encounter: Payer: Self-pay | Admitting: Physical Therapy

## 2022-09-15 DIAGNOSIS — M6281 Muscle weakness (generalized): Secondary | ICD-10-CM

## 2022-09-15 DIAGNOSIS — G8929 Other chronic pain: Secondary | ICD-10-CM

## 2022-09-15 DIAGNOSIS — M25551 Pain in right hip: Secondary | ICD-10-CM

## 2022-09-15 DIAGNOSIS — M25562 Pain in left knee: Secondary | ICD-10-CM | POA: Diagnosis not present

## 2022-09-15 NOTE — Therapy (Signed)
OUTPATIENT PHYSICAL THERAPY TREATMENT NOTE   Patient Name: Mercedes Parks MRN: [id number] DOB:Apr 24, 1981, 41 y.o., female Today's Date: 09/15/2022  PCP: Denny Levy, [place]  REFERRING PROVIDER: Leandrew Koyanagi, MD   END OF SESSION:   PT End of Session - 09/15/22 0959     Visit Number 5    Date for PT Re-Evaluation 10/27/22    Authorization Type [facility] [place] - LEFS - needs re-auth after 12/27 visit    PT Start Time 0958    PT Stop Time 1036    PT Time Calculation (min) 38 min    Activity Tolerance Patient tolerated treatment well    Behavior During Therapy [facility] for tasks assessed/performed             Past Medical History:  Diagnosis Date   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 Kellie Simmering   TONSILECTOMY, ADENOIDECTOMY, BILATERAL MYRINGOTOMY AND TUBES     Patient Active Problem List   Diagnosis Date Noted   Seizure ([place]) 11/23/2021   Gastroesophageal reflux disease 12/17/2020   Food intolerance 12/17/2020   Panniculitis 09/30/2020   Insect bite 06/26/2018   Urticaria 01/24/2018   Seasonal and perennial allergic rhinitis 01/24/2018   Allergic conjunctivitis 01/24/2018   Coughing 01/24/2018   Fatigue 04/05/2016   Hypersomnia with sleep apnea 04/05/2016   Snoring 04/05/2016   Hearing loss sensory, bilateral 04/05/2016   Constipation 07/14/2015   Abdominal pain 07/14/2015   Generalized idiopathic epilepsy, not intractable, without status epilepticus ([place]) 02/03/2015    REFERRING DIAG: Chronic pain of left knee [M25.562, G89.29]    THERAPY DIAG:  Chronic pain of left knee  Muscle weakness (generalized)  Chronic pain of right knee  Pain in right  hip  Rationale for Evaluation and Treatment Rehabilitation  PERTINENT HISTORY: Epilepsy, seizure (July 2023)   PRECAUTIONS: None  SUBJECTIVE:                                                                                                                                                                                      SUBJECTIVE STATEMENT:  Pt reports minimal pain in her knee.   PAIN:  Are you having pain? Yes Pain location: lateral joint line NPRS scale:  1/10 Aggravating factors: walking, cleaning, standing Relieving factors: rest Pain description: constant and aching Stage: Chronic Stability: staying the same 24 hour pattern: NA    OBJECTIVE: (objective measures completed at initial evaluation unless otherwise dated)   DIAGNOSTIC FINDINGS:  Nondisplaced horizontal tear at the posterior horn-body junction of the medial meniscus with vertical extension into the medial meniscal body.   Areas of moderate focal chondrosis along the trochlea and lateral tibial plateau.   Small joint effusion.   Mild findings of patellar tendon-lateral femoral condyle impingement syndrome.             GENERAL OBSERVATION/GAIT:                     R weight shift.  Moderate toe out bil   SENSATION:          Light touch: Appears intact   PALPATION: Tenderness lateral joint line   MUSCLE LENGTH: Hamstrings: Right no restriction; Left no restriction ASLR: Right ASLR = PSLR; Left ASLR = PSLR Thomas test: Right no restriction; Left no restriction Ely's test: Right subtle restriction; Left significant restriction   LE MMT:   MMT Right 09/01/2022 Left 09/01/2022  Hip flexion (L2, L3) 4 3+  Knee extension (L3) 4 3+*  Knee flexion 4 3+**  Hip abduction 3+ 3+  Hip extension 4+ 4+  Hip external rotation      Hip internal rotation      Hip adduction      Ankle dorsiflexion (L4)      Ankle plantarflexion (S1)      Ankle inversion      Ankle eversion      Great Toe ext (L5)       Grossly        (Blank rows = not tested, score listed is out of 5 possible points.  N = WNL, D = diminished, C = clear for gross weakness with myotome testing, * = concordant pain with testing)   LE ROM:   ROM Right 09/01/2022 Left 09/01/2022  Hip flexion      Hip extension      Hip abduction      Hip adduction      Hip internal rotation      Hip external rotation      Knee flexion WNL 120  Knee extension      Ankle dorsiflexion      Ankle plantarflexion      Ankle inversion      Ankle eversion        (Blank rows = not tested, N = WNL, * = concordant pain with testing)   Functional Tests   Eval (09/01/2022)                                                                                                            PATIENT SURVEYS:  LEFS 47 pts     TODAY'S TREATMENT: Creating, reviewing, and completing below HEP     PATIENT EDUCATION:  POC, diagnosis, prognosis, HEP, and outcome measures.  Pt  educated via explanation, demonstration, and handout (HEP).  Pt confirms understanding verbally.    HOME EXERCISE PROGRAM: Access Code: [facility] URL: [email or web address] Date: 09/13/2022 Prepared by: Shearon Balo  Exercises - Figure 4 Bridge  - 1 x daily - 7 x weekly - 3 sets - 10 reps - Side Plank on Knees  - 1 x daily - 7 x weekly - 1 sets - 4 reps - 5 second hold - Seated Knee Extension with Resistance  - 1 x daily - 7 x weekly - 3 sets - 10 reps - Sidelying Hip Abduction  - 1 x daily - 7 x weekly - 3 sets - 10 reps   ASTERISK SIGNS     Asterisk Signs Eval (09/01/2022) 12/15  12/20         L knee flexion in prone 70 degrees  symmetrical and full ROM in prone           L knee ext strength 3+ w/ pain    4/5        Max pain  9/10                                            TREATMENT 12/20: Therapeutic Exercise: - Bike - L4 - 5 min - S/L - Heel raise 3x15 w/ UE support - Retro TM walking  - Slant board gastroc stretch x2' - S/L hip  abd - 2x10 ea - step up (next visit) - side plank from knees - 2x12 - wall squat - 3x10 - comfortable range - knee ext machine - SL - 4x6 - 15# (NT) - HS curl - 35# - 3x10 - bil (NT)  Neuromuscular re-ed: Semi tandem (100%) on foam Rocker board DF/PF    ASSESSMENT:   CLINICAL IMPRESSION: Cloe is making good progress toward goals.  She continues to improve quad and hip strength.  We began to address balance deficit today to good effect.  She continues to show L quad strength deficit; this is improving.   OBJECTIVE IMPAIRMENTS: Pain, restricted RF length, knee strength   ACTIVITY LIMITATIONS: standing, walking, squatting, housework   PERSONAL FACTORS: See medical history and pertinent history     REHAB POTENTIAL: Good   CLINICAL DECISION MAKING: Stable/uncomplicated   EVALUATION COMPLEXITY: Low     GOALS:     SHORT TERM GOALS: Target date: 09/29/2022   Juleah will be >75% HEP compliant to improve carryover between sessions and facilitate independent management of condition   Evaluation (09/01/2022): ongoing Goal status: INITIAL     LONG TERM GOALS: Target date: 10/27/2022   Rilee will show a >/= 18 pt improvement in LEFS score (MCID is 9 pts) as a proxy for functional improvement    Evaluation/Baseline (09/01/2022): 47 pts Goal status: INITIAL     2.  Letita will achieve 100 degrees knee flexion in prone to improve ability to complete transfers and navigate steps   Evaluation/Baseline (09/01/2022): 70 degrees 12/18: Full Goal status: MET       3.  Arieanna will improve the following MMTs to >/= 4+/5 to show improvement in strength:  knee ext and flexion    Evaluation/Baseline (09/01/2022):    LE MMT:   MMT Right 09/01/2022 Left 09/01/2022 R 12/8 L 12/8  Hip flexion (L2, L3) 4 3+  4  Knee extension (L3) 4 3+*  4  Knee flexion 4 3+**  4  Hip abduction 3+ 3+ 4 3+  Hip extension 4+ 4+    Hip external rotation        Hip internal rotation        Hip  adduction        Ankle dorsiflexion (L4)        Ankle plantarflexion (S1)        Ankle inversion        Ankle eversion        Great Toe ext (L5)        Grossly          (Blank rows = not tested, score listed is out of 5 possible points.  N = WNL, D = diminished, C = clear for gross weakness with myotome testing, * = concordant pain with testing)   Goal status: INITIAL     4.  Ezella will self report >/= 50% decrease in pain from evaluation    Evaluation/Baseline (09/01/2022): 9/10 max pain 12/18: MET Goal status: MET       PLAN: PT FREQUENCY: 1-2x/week   PT DURATION: 8 weeks (Ending 10/27/2022)   PLANNED INTERVENTIONS: Therapeutic exercises, Aquatic therapy, Therapeutic activity, Neuro Muscular re-education, Gait training, Patient/Family education, Joint mobilization, Dry Needling, Electrical stimulation, Spinal mobilization and/or manipulation, Moist heat, Taping, Vasopneumatic device, Ionotophoresis 5m/ml Dexamethasone, and Manual therapy   PLAN FOR NEXT SESSION: progressive knee strengthening, RF stretching (L)Mathis Dad PT 09/15/2022, 10:38 AM

## 2022-09-17 NOTE — Therapy (Incomplete)
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 Kellie Simmering   TONSILECTOMY, ADENOIDECTOMY, BILATERAL MYRINGOTOMY AND TUBES     Patient Active Problem List   Diagnosis Date Noted   Seizure ([place]) 11/23/2021   Gastroesophageal reflux disease 12/17/2020   Food intolerance 12/17/2020   Panniculitis 09/30/2020   Insect bite 06/26/2018   Urticaria 01/24/2018   Seasonal and perennial allergic rhinitis 01/24/2018   Allergic conjunctivitis 01/24/2018   Coughing 01/24/2018   Fatigue 04/05/2016   Hypersomnia with sleep apnea 04/05/2016   Snoring 04/05/2016   Hearing loss sensory, bilateral 04/05/2016   Constipation 07/14/2015   Abdominal pain 07/14/2015   Generalized idiopathic epilepsy, not intractable, without status epilepticus ([place]) 02/03/2015    REFERRING DIAG: Chronic pain of left knee [M25.562, G89.29]    THERAPY DIAG:  No diagnosis found.  Rationale for Evaluation and Treatment Rehabilitation  PERTINENT HISTORY: Epilepsy, seizure (July 2023)   PRECAUTIONS: None  SUBJECTIVE:                                                                                                                                                                                      SUBJECTIVE STATEMENT:  ***Pt reports minimal pain in her knee.   PAIN:  Are you having pain? Yes Pain location: lateral joint line NPRS  scale:  ***1/10 Aggravating factors: walking, cleaning, standing Relieving factors: rest Pain description: constant and aching Stage: Chronic Stability: staying the same 24 hour pattern: NA    OBJECTIVE: (objective measures completed at initial evaluation unless otherwise dated)   DIAGNOSTIC FINDINGS:  Nondisplaced horizontal tear at the posterior horn-body junction of the medial meniscus with vertical extension into the medial meniscal body.   Areas of moderate focal chondrosis along the trochlea and lateral tibial plateau.   Small joint effusion.   Mild findings of patellar tendon-lateral femoral condyle impingement syndrome.             GENERAL  OBSERVATION/GAIT:                     R weight shift.  Moderate toe out bil   SENSATION:          Light touch: Appears intact   PALPATION: Tenderness lateral joint line   MUSCLE LENGTH: Hamstrings: Right no restriction; Left no restriction ASLR: Right ASLR = PSLR; Left ASLR = PSLR Thomas test: Right no restriction; Left no restriction Ely's test: Right subtle restriction; Left significant restriction   LE MMT:   MMT Right 09/01/2022 Left 09/01/2022  Hip flexion (L2, L3) 4 3+  Knee extension (L3) 4 3+*  Knee flexion 4 3+**  Hip abduction 3+ 3+  Hip extension 4+ 4+  Hip external rotation      Hip internal rotation      Hip adduction      Ankle dorsiflexion (L4)      Ankle plantarflexion (S1)      Ankle inversion      Ankle eversion      Great Toe ext (L5)      Grossly        (Blank rows = not tested, score listed is out of 5 possible points.  N = WNL, D = diminished, C = clear for gross weakness with myotome testing, * = concordant pain with testing)   LE ROM:   ROM Right 09/01/2022 Left 09/01/2022  Hip flexion      Hip extension      Hip abduction      Hip adduction      Hip internal rotation      Hip external rotation      Knee flexion WNL 120  Knee extension      Ankle dorsiflexion      Ankle  plantarflexion      Ankle inversion      Ankle eversion        (Blank rows = not tested, N = WNL, * = concordant pain with testing)   Functional Tests   Eval (09/01/2022)                                                                                                            PATIENT SURVEYS:  LEFS 47 pts     TODAY'S TREATMENT: Creating, reviewing, and completing below HEP     PATIENT EDUCATION:  POC, diagnosis, prognosis, HEP, and outcome measures.  Pt educated via explanation, demonstration, and handout (HEP).  Pt confirms understanding verbally.    HOME EXERCISE PROGRAM: Access Code: [facility] URL: [email or web address] Date: 09/13/2022 Prepared by: Shearon Balo  Exercises - Figure 4 Bridge  - 1 x daily - 7 x weekly - 3 sets - 10 reps - Side Plank on Knees  - 1 x daily - 7 x weekly - 1 sets - 4 reps - 5 second hold - Seated Knee Extension with Resistance  - 1 x daily - 7 x weekly - 3 sets - 10 reps - Sidelying Hip Abduction  - 1 x daily -  7 x weekly - 3 sets - 10 reps   ASTERISK SIGNS     Asterisk Signs Eval (09/01/2022) 12/15  12/20         L knee flexion in prone 70 degrees  symmetrical and full ROM in prone           L knee ext strength 3+ w/ pain    4/5        Max pain  9/10                                           TREATMENT 12/27: Therapeutic Exercise: - Bike - L4 - 5 min - S/L - Heel raise 3x15 w/ UE support - Retro TM walking  - Slant board gastroc stretch x2' - S/L hip abd - 2x10 ea - step up (next visit) - side plank from knees - 2x12 - wall squat - 3x10 - comfortable range - knee ext machine - SL - 4x6 - 15# (NT) - HS curl - 35# - 3x10 - bil (NT)  Neuromuscular re-ed: Semi tandem (100%) on foam Rocker board DF/PF  TREATMENT 12/20: Therapeutic Exercise: - Bike - L4 - 5 min - S/L - Heel raise 3x15 w/ UE support - Retro TM walking  - Slant board gastroc stretch x2' - S/L hip abd - 2x10 ea - step up (next  visit) - side plank from knees - 2x12 - wall squat - 3x10 - comfortable range - knee ext machine - SL - 4x6 - 15# (NT) - HS curl - 35# - 3x10 - bil (NT)  Neuromuscular re-ed: Semi tandem (100%) on foam Rocker board DF/PF    ASSESSMENT:   CLINICAL IMPRESSION: ***  Mercedes Parks is making good progress toward goals.  She continues to improve quad and hip strength.  We began to address balance deficit today to good effect.  She continues to show L quad strength deficit; this is improving.   OBJECTIVE IMPAIRMENTS: Pain, restricted RF length, knee strength   ACTIVITY LIMITATIONS: standing, walking, squatting, housework   PERSONAL FACTORS: See medical history and pertinent history     REHAB POTENTIAL: Good   CLINICAL DECISION MAKING: Stable/uncomplicated   EVALUATION COMPLEXITY: Low     GOALS:     SHORT TERM GOALS: Target date: 09/29/2022   Mercedes Parks will be >75% HEP compliant to improve carryover between sessions and facilitate independent management of condition   Evaluation (09/01/2022): ongoing Goal status: INITIAL     LONG TERM GOALS: Target date: 10/27/2022   Mercedes Parks will show a >/= 18 pt improvement in LEFS score (MCID is 9 pts) as a proxy for functional improvement    Evaluation/Baseline (09/01/2022): 47 pts Goal status: INITIAL     2.  Mercedes Parks will achieve 100 degrees knee flexion in prone to improve ability to complete transfers and navigate steps   Evaluation/Baseline (09/01/2022): 70 degrees 12/18: Full Goal status: MET       3.  Mercedes Parks will improve the following MMTs to >/= 4+/5 to show improvement in strength:  knee ext and flexion    Evaluation/Baseline (09/01/2022):    LE MMT:   MMT Right 09/01/2022 Left 09/01/2022 R 12/8 L 12/8  Hip flexion (L2, L3) 4 3+  4  Knee extension (L3) 4 3+*  4  Knee flexion 4 3+**  4  Hip abduction 3+ 3+ 4 3+  Hip extension 4+  4+    Hip external rotation        Hip internal rotation        Hip adduction        Ankle  dorsiflexion (L4)        Ankle plantarflexion (S1)        Ankle inversion        Ankle eversion        Great Toe ext (L5)        Grossly          (Blank rows = not tested, score listed is out of 5 possible points.  N = WNL, D = diminished, C = clear for gross weakness with myotome testing, * = concordant pain with testing)   Goal status: INITIAL     4.  Mercedes Parks will self report >/= 50% decrease in pain from evaluation    Evaluation/Baseline (09/01/2022): 9/10 max pain 12/18: MET Goal status: MET       PLAN: PT FREQUENCY: 1-2x/week   PT DURATION: 8 weeks (Ending 10/27/2022)   PLANNED INTERVENTIONS: Therapeutic exercises, Aquatic therapy, Therapeutic activity, Neuro Muscular re-education, Gait training, Patient/Family education, Joint mobilization, Dry Needling, Electrical stimulation, Spinal mobilization and/or manipulation, Moist heat, Taping, Vasopneumatic device, Ionotophoresis 20m/ml Dexamethasone, and Manual therapy   PLAN FOR NEXT SESSION: progressive knee strengthening, RF stretching (L)Margarette Canada PTA 09/17/2022, 11:43 AM

## 2022-09-22 ENCOUNTER — Ambulatory Visit: Payer: Medicaid Other

## 2022-09-24 ENCOUNTER — Ambulatory Visit: Payer: Medicaid Other | Admitting: Physical Therapy

## 2022-09-24 NOTE — Therapy (Incomplete)
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 Kellie Simmering   TONSILECTOMY, ADENOIDECTOMY, [place]     Patient Active Problem List   Diagnosis Date Noted   Seizure ([place]) 11/23/2021   Gastroesophageal reflux disease 12/17/2020   Food intolerance 12/17/2020   Panniculitis 09/30/2020   Insect bite 06/26/2018   Urticaria 01/24/2018   Seasonal and perennial allergic rhinitis 01/24/2018   Allergic conjunctivitis 01/24/2018   Coughing 01/24/2018   Fatigue 04/05/2016   Hypersomnia with sleep apnea 04/05/2016   Snoring 04/05/2016   Hearing loss sensory, bilateral 04/05/2016   Constipation 07/14/2015   Abdominal pain 07/14/2015   Generalized idiopathic epilepsy, not intractable, without status epilepticus ([place]) 02/03/2015    REFERRING DIAG: Chronic pain of left knee [M25.562, G89.29]    THERAPY DIAG:  No diagnosis found.  Rationale for Evaluation and Treatment Rehabilitation  PERTINENT HISTORY: Epilepsy, seizure (July 2023)   PRECAUTIONS: None  SUBJECTIVE:                                                                                                                                                                                      SUBJECTIVE STATEMENT:  ***Pt reports minimal pain in her knee.   PAIN:  Are you having pain? Yes Pain location: lateral joint line NPRS  scale:  ***1/10 Aggravating factors: walking, cleaning, standing Relieving factors: rest Pain description: constant and aching Stage: Chronic Stability: staying the same 24 hour pattern: NA    OBJECTIVE: (objective measures completed at initial evaluation unless otherwise dated)   DIAGNOSTIC FINDINGS:  Nondisplaced horizontal tear at the posterior horn-body junction of the medial meniscus with vertical extension into the medial meniscal body.   Areas of moderate focal chondrosis along the trochlea and lateral tibial plateau.   Small joint effusion.   Mild findings of patellar tendon-lateral femoral condyle impingement syndrome.             GENERAL  OBSERVATION/GAIT:                     R weight shift.  Moderate toe out bil   SENSATION:          Light touch: Appears intact   PALPATION: Tenderness lateral joint line   MUSCLE LENGTH: Hamstrings: Right no restriction; Left no restriction ASLR: Right ASLR = PSLR; Left ASLR = PSLR Thomas test: Right no restriction; Left no restriction Ely's test: Right subtle restriction; Left significant restriction   LE MMT:   MMT Right 09/01/2022 Left 09/01/2022  Hip flexion (L2, L3) 4 3+  Knee extension (L3) 4 3+*  Knee flexion 4 3+**  Hip abduction 3+ 3+  Hip extension 4+ 4+  Hip external rotation      Hip internal rotation      Hip adduction      Ankle dorsiflexion (L4)      Ankle plantarflexion (S1)      Ankle inversion      Ankle eversion      Great Toe ext (L5)      Grossly        (Blank rows = not tested, score listed is out of 5 possible points.  N = WNL, D = diminished, C = clear for gross weakness with myotome testing, * = concordant pain with testing)   LE ROM:   ROM Right 09/01/2022 Left 09/01/2022  Hip flexion      Hip extension      Hip abduction      Hip adduction      Hip internal rotation      Hip external rotation      Knee flexion WNL 120  Knee extension      Ankle dorsiflexion      Ankle  plantarflexion      Ankle inversion      Ankle eversion        (Blank rows = not tested, N = WNL, * = concordant pain with testing)   Functional Tests   Eval (09/01/2022)                                                                                                            PATIENT SURVEYS:  LEFS 47 pts     TODAY'S TREATMENT: Creating, reviewing, and completing below HEP     PATIENT EDUCATION:  POC, diagnosis, prognosis, HEP, and outcome measures.  Pt educated via explanation, demonstration, and handout (HEP).  Pt confirms understanding verbally.    HOME EXERCISE PROGRAM: Access Code: [facility] URL: [email or web address] Date: 09/13/2022 Prepared by: Shearon Balo  Exercises - Figure 4 Bridge  - 1 x daily - 7 x weekly - 3 sets - 10 reps - Side Plank on Knees  - 1 x daily - 7 x weekly - 1 sets - 4 reps - 5 second hold - Seated Knee Extension with Resistance  - 1 x daily - 7 x weekly - 3 sets - 10 reps - Sidelying Hip Abduction  - 1 x daily -  7 x weekly - 3 sets - 10 reps   ASTERISK SIGNS     Asterisk Signs Eval (09/01/2022) 12/15  12/20         L knee flexion in prone 70 degrees  symmetrical and full ROM in prone           L knee ext strength 3+ w/ pain    4/5        Max pain  9/10                                           TREATMENT 09/28/22: Therapeutic Exercise: - Bike - L4 - 5 min - S/L - Heel raise 3x15 w/ UE support - Retro TM walking  - Slant board gastroc stretch x2' - S/L hip abd - 2x10 ea - step up (next visit) - side plank from knees - 2x12 - wall squat - 3x10 - comfortable range - knee ext machine - SL - 4x6 - 15# (NT) - HS curl - 35# - 3x10 - bil (NT)  Neuromuscular re-ed: Semi tandem (100%) on foam Rocker board DF/PF  TREATMENT 12/20: Therapeutic Exercise: - Bike - L4 - 5 min - S/L - Heel raise 3x15 w/ UE support - Retro TM walking  - Slant board gastroc stretch x2' - S/L hip abd - 2x10 ea - step up (next  visit) - side plank from knees - 2x12 - wall squat - 3x10 - comfortable range - knee ext machine - SL - 4x6 - 15# (NT) - HS curl - 35# - 3x10 - bil (NT)  Neuromuscular re-ed: Semi tandem (100%) on foam Rocker board DF/PF    ASSESSMENT:   CLINICAL IMPRESSION: ***  Cecille is making good progress toward goals.  She continues to improve quad and hip strength.  We began to address balance deficit today to good effect.  She continues to show L quad strength deficit; this is improving.   OBJECTIVE IMPAIRMENTS: Pain, restricted RF length, knee strength   ACTIVITY LIMITATIONS: standing, walking, squatting, housework   PERSONAL FACTORS: See medical history and pertinent history     REHAB POTENTIAL: Good   CLINICAL DECISION MAKING: Stable/uncomplicated   EVALUATION COMPLEXITY: Low     GOALS:     SHORT TERM GOALS: Target date: 09/29/2022   Kimorah will be >75% HEP compliant to improve carryover between sessions and facilitate independent management of condition   Evaluation (09/01/2022): ongoing Goal status: INITIAL     LONG TERM GOALS: Target date: 10/27/2022   Carol will show a >/= 18 pt improvement in LEFS score (MCID is 9 pts) as a proxy for functional improvement    Evaluation/Baseline (09/01/2022): 47 pts Goal status: INITIAL     2.  Kodee will achieve 100 degrees knee flexion in prone to improve ability to complete transfers and navigate steps   Evaluation/Baseline (09/01/2022): 70 degrees 12/18: Full Goal status: MET       3.  Shea will improve the following MMTs to >/= 4+/5 to show improvement in strength:  knee ext and flexion    Evaluation/Baseline (09/01/2022):    LE MMT:   MMT Right 09/01/2022 Left 09/01/2022 R 12/8 L 12/8  Hip flexion (L2, L3) 4 3+  4  Knee extension (L3) 4 3+*  4  Knee flexion 4 3+**  4  Hip abduction 3+ 3+ 4 3+  Hip extension 4+  4+    Hip external rotation        Hip internal rotation        Hip adduction        Ankle  dorsiflexion (L4)        Ankle plantarflexion (S1)        Ankle inversion        Ankle eversion        Great Toe ext (L5)        Grossly          (Blank rows = not tested, score listed is out of 5 possible points.  N = WNL, D = diminished, C = clear for gross weakness with myotome testing, * = concordant pain with testing)   Goal status: INITIAL     4.  Jasenia will self report >/= 50% decrease in pain from evaluation    Evaluation/Baseline (09/01/2022): 9/10 max pain 12/18: MET Goal status: MET       PLAN: PT FREQUENCY: 1-2x/week   PT DURATION: 8 weeks (Ending 10/27/2022)   PLANNED INTERVENTIONS: Therapeutic exercises, Aquatic therapy, Therapeutic activity, Neuro Muscular re-education, Gait training, Patient/Family education, Joint mobilization, Dry Needling, Electrical stimulation, Spinal mobilization and/or manipulation, Moist heat, Taping, Vasopneumatic device, Ionotophoresis 76m/ml Dexamethasone, and Manual therapy   PLAN FOR NEXT SESSION: progressive knee strengthening, RF stretching (L)Margarette Canada PTA 09/24/2022, 12:06 PM

## 2022-09-28 ENCOUNTER — Ambulatory Visit: Payer: Medicaid Other | Attending: Orthopaedic Surgery

## 2022-09-28 DIAGNOSIS — M25561 Pain in right knee: Secondary | ICD-10-CM | POA: Diagnosis present

## 2022-09-28 DIAGNOSIS — M25552 Pain in left hip: Secondary | ICD-10-CM | POA: Diagnosis present

## 2022-09-28 DIAGNOSIS — M6281 Muscle weakness (generalized): Secondary | ICD-10-CM | POA: Insufficient documentation

## 2022-09-28 DIAGNOSIS — M25651 Stiffness of right hip, not elsewhere classified: Secondary | ICD-10-CM | POA: Insufficient documentation

## 2022-09-28 DIAGNOSIS — R262 Difficulty in walking, not elsewhere classified: Secondary | ICD-10-CM | POA: Insufficient documentation

## 2022-09-28 DIAGNOSIS — M25562 Pain in left knee: Secondary | ICD-10-CM | POA: Insufficient documentation

## 2022-09-28 DIAGNOSIS — G8929 Other chronic pain: Secondary | ICD-10-CM | POA: Insufficient documentation

## 2022-09-28 DIAGNOSIS — M25551 Pain in right hip: Secondary | ICD-10-CM | POA: Insufficient documentation

## 2022-10-04 ENCOUNTER — Ambulatory Visit: Payer: Medicaid Other | Admitting: Diagnostic Neuroimaging

## 2022-10-05 ENCOUNTER — Ambulatory Visit: Payer: Medicaid Other

## 2022-10-12 ENCOUNTER — Ambulatory Visit: Payer: Medicaid Other

## 2022-10-12 DIAGNOSIS — G8929 Other chronic pain: Secondary | ICD-10-CM

## 2022-10-12 DIAGNOSIS — M25562 Pain in left knee: Secondary | ICD-10-CM | POA: Diagnosis not present

## 2022-10-12 DIAGNOSIS — M6281 Muscle weakness (generalized): Secondary | ICD-10-CM

## 2022-10-12 NOTE — Therapy (Signed)
OUTPATIENT PHYSICAL THERAPY TREATMENT NOTE   Patient Name: Mercedes Parks MRN: [id number] DOB:01-Jan-1981, 42 y.o., female Today's Date: 10/12/2022  PCP: Denny Levy, [place]  REFERRING PROVIDER: Leandrew Koyanagi, MD   END OF SESSION:   PT End of Session - 10/12/22 0905     Visit Number 7    Date for PT Re-Evaluation 10/27/22    Authorization Type Submitted for 3 visits 09/29/22    PT Start Time 0910    PT Stop Time 0952    PT Time Calculation (min) 42 min    Activity Tolerance Patient tolerated treatment well    Behavior During Therapy [facility] for tasks assessed/performed               Past Medical History:  Diagnosis Date   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 Kellie Simmering   TONSILECTOMY, ADENOIDECTOMY, [place]     Patient Active Problem List   Diagnosis Date Noted   Seizure ([place]) 11/23/2021   Gastroesophageal reflux disease 12/17/2020   Food intolerance 12/17/2020   Panniculitis 09/30/2020   Insect bite 06/26/2018   Urticaria 01/24/2018   Seasonal and perennial allergic rhinitis 01/24/2018   Allergic conjunctivitis 01/24/2018   Coughing 01/24/2018   Fatigue 04/05/2016   Hypersomnia with sleep apnea 04/05/2016   Snoring 04/05/2016   Hearing loss sensory, bilateral 04/05/2016   Constipation 07/14/2015   Abdominal pain 07/14/2015   Generalized idiopathic epilepsy, not intractable, without status epilepticus ([place]) 02/03/2015    REFERRING DIAG: Chronic pain of left knee [M25.562, G89.29]    THERAPY DIAG:  Chronic pain of left knee  Muscle weakness (generalized)  Chronic pain of right knee  Rationale for Evaluation and  Treatment Rehabilitation  PERTINENT HISTORY: Epilepsy, seizure (July 2023)   PRECAUTIONS: None  SUBJECTIVE:                                                                                                                                                                                      SUBJECTIVE STATEMENT:  Patient reports no current pain, her HEP has been going well.   PAIN:  Are you having pain? Yes Pain location: lateral joint line NPRS scale:  0/10 Aggravating factors:  walking, cleaning, standing Relieving factors: rest Pain description: constant and aching Stage: Chronic Stability: staying the same 24 hour pattern: NA    OBJECTIVE: (objective measures completed at initial evaluation unless otherwise dated)   DIAGNOSTIC FINDINGS:  Nondisplaced horizontal tear at the posterior horn-body junction of the medial meniscus with vertical extension into the medial meniscal body.   Areas of moderate focal chondrosis along the trochlea and lateral tibial plateau.   Small joint effusion.   Mild findings of patellar tendon-lateral femoral condyle impingement syndrome.             GENERAL OBSERVATION/GAIT:                     R weight shift.  Moderate toe out bil   SENSATION:          Light touch: Appears intact   PALPATION: Tenderness lateral joint line   MUSCLE LENGTH: Hamstrings: Right no restriction; Left no restriction ASLR: Right ASLR = PSLR; Left ASLR = PSLR Thomas test: Right no restriction; Left no restriction Ely's test: Right subtle restriction; Left significant restriction   LE MMT:   MMT Right 09/01/2022 Left 09/01/2022  Hip flexion (L2, L3) 4 3+  Knee extension (L3) 4 3+*  Knee flexion 4 3+**  Hip abduction 3+ 3+  Hip extension 4+ 4+  Hip external rotation      Hip internal rotation      Hip adduction      Ankle dorsiflexion (L4)      Ankle plantarflexion (S1)      Ankle inversion      Ankle eversion      Great Toe ext (L5)      Grossly         (Blank rows = not tested, score listed is out of 5 possible points.  N = WNL, D = diminished, C = clear for gross weakness with myotome testing, * = concordant pain with testing)   LE ROM:   ROM Right 09/01/2022 Left 09/01/2022  Hip flexion      Hip extension      Hip abduction      Hip adduction      Hip internal rotation      Hip external rotation      Knee flexion WNL 120  Knee extension      Ankle dorsiflexion      Ankle plantarflexion      Ankle inversion      Ankle eversion        (Blank rows = not tested, N = WNL, * = concordant pain with testing)   Functional Tests   Eval (09/01/2022)                                                                                                            PATIENT SURVEYS:  LEFS 47 pts     TODAY'S TREATMENT: Creating, reviewing, and completing below HEP     PATIENT EDUCATION:  POC, diagnosis, prognosis, HEP, and outcome measures.  Pt educated via explanation,  demonstration, and handout (HEP).  Pt confirms understanding verbally.    HOME EXERCISE PROGRAM: Access Code: [facility] URL: [email or web address] Date: 09/13/2022 Prepared by: Shearon Balo  Exercises - Figure 4 Bridge  - 1 x daily - 7 x weekly - 3 sets - 10 reps - Side Plank on Knees  - 1 x daily - 7 x weekly - 1 sets - 4 reps - 5 second hold - Seated Knee Extension with Resistance  - 1 x daily - 7 x weekly - 3 sets - 10 reps - Sidelying Hip Abduction  - 1 x daily - 7 x weekly - 3 sets - 10 reps   ASTERISK SIGNS     Asterisk Signs Eval (09/01/2022) 12/15  12/20         L knee flexion in prone 70 degrees  symmetrical and full ROM in prone           L knee ext strength 3+ w/ pain    4/5        Max pain  9/10                                           TREATMENT 10/12/22: Therapeutic Exercise: - Bike - L4 - 5 min - S/L - Heel raise 3x15 w/ UE support - Retro TM walking x2' - Slant board gastroc stretch x2' - step up 8" 2x10  fwd/lateral Lt leading - wall squat - 3x10 - comfortable range - 2" heel taps L on step 2x10 - knee ext machine - 3x10 - 15#  - HS curl - 35# - 3x10 - bil - Cybex hip flexion/abduction/extension 25# 2x10 each BIL (pain in groin with hip flexion on L)  Neuromuscular re-ed: Full tandem (100%) on foam (occasional fingertip support to maintain balance) SLS on Lt x30" (occasional fingertip support) Tandem walking on Airex balance beam x4 trips fwd/bwd (fingertip support needed for bwd)   TREATMENT 09/28/22: Therapeutic Exercise: - Bike - L4 - 5 min - S/L - Heel raise 3x15 w/ UE support - Retro TM walking x2' - Slant board gastroc stretch x2' - step up 6" 2x10 fwd/lateral Lt leading - wall squat - 3x10 - comfortable range - knee ext machine - 4x6 - 15#  - HS curl - 35# - 3x10 - bil - Cybex hip abduction/extension 25# 2x10 each BIL  Neuromuscular re-ed: Full tandem (100%) on foam (occasional fingertip support to maintain balance) Rocker board DF/PF SLS on Lt x30" (occasional fingertip support)     ASSESSMENT:   CLINICAL IMPRESSION: Patient presents to PT reporting no current pain in her knee and also reports HEP compliance. Session today continued to focus on knee and LE strengthening as well as balance tasks. Increased difficulty of balance tasks today to good affect, only occasional fingertip support needed to maintain balance. Patient was able to tolerate all prescribed exercises with no adverse effects. Possible DC next session.   OBJECTIVE IMPAIRMENTS: Pain, restricted RF length, knee strength   ACTIVITY LIMITATIONS: standing, walking, squatting, housework   PERSONAL FACTORS: See medical history and pertinent history     REHAB POTENTIAL: Good   CLINICAL DECISION MAKING: Stable/uncomplicated   EVALUATION COMPLEXITY: Low     GOALS:     SHORT TERM GOALS: Target date: 09/29/2022   Branna will be >75% HEP compliant to improve carryover between sessions and facilitate  independent management of condition  Evaluation (09/01/2022): ongoing Goal status: MET STG (HEP) Pt reports adherence 09/28/22     LONG TERM GOALS: Target date: 10/27/2022   Jaleigh will show a >/= 18 pt improvement in LEFS score (MCID is 9 pts) as a proxy for functional improvement    Evaluation/Baseline (09/01/2022): 47 pts Goal status: Progressing     2.  Florine will achieve 100 degrees knee flexion in prone to improve ability to complete transfers and navigate steps   Evaluation/Baseline (09/01/2022): 70 degrees 12/18: Full Goal status: MET       3.  Anaria will improve the following MMTs to >/= 4+/5 to show improvement in strength:  knee ext and flexion    Evaluation/Baseline (09/01/2022):    LE MMT:   MMT Right 09/01/2022 Left 09/01/2022 R 12/8 L 12/8  Hip flexion (L2, L3) 4 3+  4  Knee extension (L3) 4 3+*  4  Knee flexion 4 3+**  4  Hip abduction 3+ 3+ 4 3+  Hip extension 4+ 4+    Hip external rotation        Hip internal rotation        Hip adduction        Ankle dorsiflexion (L4)        Ankle plantarflexion (S1)        Ankle inversion        Ankle eversion        Great Toe ext (L5)        Grossly          (Blank rows = not tested, score listed is out of 5 possible points.  N = WNL, D = diminished, C = clear for gross weakness with myotome testing, * = concordant pain with testing)   Goal status: Progressing     4.  Shanisha will self report >/= 50% decrease in pain from evaluation    Evaluation/Baseline (09/01/2022): 9/10 max pain 12/18: MET Goal status: MET       PLAN: PT FREQUENCY: 1-2x/week   PT DURATION: 8 weeks (Ending 10/27/2022)   PLANNED INTERVENTIONS: Therapeutic exercises, Aquatic therapy, Therapeutic activity, Neuro Muscular re-education, Gait training, Patient/Family education, Joint mobilization, Dry Needling, Electrical stimulation, Spinal mobilization and/or manipulation, Moist heat, Taping, Vasopneumatic device, Ionotophoresis  '4mg'$ /ml Dexamethasone, and Manual therapy   PLAN FOR NEXT SESSION: progressive knee strengthening, RF stretching (L), possible DC next session   Margarette Canada, PTA 10/12/2022, 9:52 AM

## 2022-10-19 ENCOUNTER — Ambulatory Visit: Payer: Medicaid Other | Admitting: Physical Therapy

## 2022-10-19 ENCOUNTER — Encounter: Payer: Self-pay | Admitting: Physical Therapy

## 2022-10-19 DIAGNOSIS — M25651 Stiffness of right hip, not elsewhere classified: Secondary | ICD-10-CM

## 2022-10-19 DIAGNOSIS — M6281 Muscle weakness (generalized): Secondary | ICD-10-CM

## 2022-10-19 DIAGNOSIS — G8929 Other chronic pain: Secondary | ICD-10-CM

## 2022-10-19 DIAGNOSIS — M25552 Pain in left hip: Secondary | ICD-10-CM

## 2022-10-19 DIAGNOSIS — M25562 Pain in left knee: Secondary | ICD-10-CM | POA: Diagnosis not present

## 2022-10-19 DIAGNOSIS — M25551 Pain in right hip: Secondary | ICD-10-CM

## 2022-10-19 DIAGNOSIS — R262 Difficulty in walking, not elsewhere classified: Secondary | ICD-10-CM

## 2022-10-19 NOTE — Therapy (Signed)
PHYSICAL THERAPY DISCHARGE SUMMARY  Visits from Start of Care: 8  Current functional level related to goals / functional outcomes: See assessment/goals   Remaining deficits: See assessment/goals   Education / Equipment: HEP and D/C plans  Patient agrees to discharge. Patient goals were met. Patient is being discharged due to meeting the stated rehab goals.    Patient Name: Mercedes Parks MRN: [id number] DOB:[birth year], 42 y.o., female Today's Date: 10/19/2022  PCP: Denny Levy, [place]  REFERRING PROVIDER: Leandrew Koyanagi, MD   END OF SESSION:   PT End of Session - 10/19/22 0908     Visit Number 8    Date for PT Re-Evaluation 10/27/22    Authorization Type Submitted for 3 visits 09/29/22    PT Start Time 0915    PT Stop Time 0956    PT Time Calculation (min) 41 min    Activity Tolerance Patient tolerated treatment well    Behavior During Therapy [facility] for tasks assessed/performed               Past Medical History:  Diagnosis Date   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 Kellie Simmering   TONSILECTOMY, ADENOIDECTOMY, BILATERAL MYRINGOTOMY AND TUBES     Patient Active Problem List   Diagnosis Date Noted   Seizure ([place]) 11/23/2021   Gastroesophageal reflux disease 12/17/2020   Food intolerance 12/17/2020   Panniculitis 09/30/2020   Insect bite 06/26/2018   Urticaria 01/24/2018   Seasonal and perennial allergic rhinitis 01/24/2018   Allergic conjunctivitis 01/24/2018   Coughing 01/24/2018   Fatigue 04/05/2016   Hypersomnia with sleep apnea 04/05/2016   Snoring 04/05/2016   Hearing loss sensory, bilateral 04/05/2016   Constipation 07/14/2015    Abdominal pain 07/14/2015   Generalized idiopathic epilepsy, not intractable, without status epilepticus ([place]) 02/03/2015    REFERRING DIAG: Chronic pain of left knee [M25.562, G89.29]    THERAPY DIAG:  Chronic pain of left knee  Muscle weakness (generalized)  Chronic pain of right knee  Pain in right hip  Pain in left hip  Stiffness of right hip, not elsewhere classified  Difficulty in walking, not elsewhere classified  Rationale for Evaluation and Treatment Rehabilitation  PERTINENT HISTORY: Epilepsy, seizure (July 2023)   PRECAUTIONS: None  SUBJECTIVE:  SUBJECTIVE STATEMENT:  Pt reports she is doing well, she is having no pain.  She feels she is ready for D/C.   PAIN:  Are you having pain? Yes Pain location: lateral joint line NPRS scale:  0/10 Aggravating factors: walking, cleaning, standing Relieving factors: rest Pain description: constant and aching Stage: Chronic Stability: staying the same 24 hour pattern: NA    OBJECTIVE: (objective measures completed at initial evaluation unless otherwise dated)   DIAGNOSTIC FINDINGS:  Nondisplaced horizontal tear at the posterior horn-body junction of the medial meniscus with vertical extension into the medial meniscal body.   Areas of moderate focal chondrosis along the trochlea and lateral tibial plateau.   Small joint effusion.   Mild findings of patellar tendon-lateral femoral condyle impingement syndrome.             GENERAL OBSERVATION/GAIT:                     R weight shift.  Moderate toe out bil   SENSATION:          Light touch: Appears intact   PALPATION: Tenderness lateral joint line   MUSCLE LENGTH: Hamstrings: Right no restriction; Left no restriction ASLR: Right ASLR = PSLR; Left ASLR = PSLR Thomas test:  Right no restriction; Left no restriction Ely's test: Right subtle restriction; Left significant restriction   LE MMT:   MMT Right 09/01/2022 Left 09/01/2022  Hip flexion (L2, L3) 4 3+  Knee extension (L3) 4 3+*  Knee flexion 4 3+**  Hip abduction 3+ 3+  Hip extension 4+ 4+  Hip external rotation      Hip internal rotation      Hip adduction      Ankle dorsiflexion (L4)      Ankle plantarflexion (S1)      Ankle inversion      Ankle eversion      Great Toe ext (L5)      Grossly        (Blank rows = not tested, score listed is out of 5 possible points.  N = WNL, D = diminished, C = clear for gross weakness with myotome testing, * = concordant pain with testing)   LE ROM:   ROM Right 09/01/2022 Left 09/01/2022  Hip flexion      Hip extension      Hip abduction      Hip adduction      Hip internal rotation      Hip external rotation      Knee flexion WNL 120  Knee extension      Ankle dorsiflexion      Ankle plantarflexion      Ankle inversion      Ankle eversion        (Blank rows = not tested, N = WNL, * = concordant pain with testing)   Functional Tests   Eval (09/01/2022)  PATIENT SURVEYS:  LEFS 47 pts     TODAY'S TREATMENT: Creating, reviewing, and completing below HEP     PATIENT EDUCATION:  POC, diagnosis, prognosis, HEP, and outcome measures.  Pt educated via explanation, demonstration, and handout (HEP).  Pt confirms understanding verbally.    HOME EXERCISE PROGRAM: Access Code: [facility] URL: [email or web address] Date: 09/13/2022 Prepared by: Shearon Balo  Exercises - Figure 4 Bridge  - 1 x daily - 7 x weekly - 3 sets - 10 reps - Side Plank on Knees  - 1 x daily - 7 x weekly - 1 sets - 4 reps - 5 second hold - Seated Knee Extension with Resistance  - 1 x daily - 7 x weekly - 3 sets - 10 reps - Sidelying Hip  Abduction  - 1 x daily - 7 x weekly - 3 sets - 10 reps   ASTERISK SIGNS     Asterisk Signs Eval (09/01/2022) 12/15  12/20         L knee flexion in prone 70 degrees  symmetrical and full ROM in prone           L knee ext strength 3+ w/ pain    4/5        Max pain  9/10                                           TREATMENT 10/19/22: Therapeutic Exercise: - Bike - L7 - 5 min - no UE support - S/L - Heel raise 3x15 w/ UE support - Retro TM walking x2' - Slant board gastroc stretch x2' - step up 8" 2x10 fwd/lateral Lt leading - wall squat - 3x10 - comfortable range - 2" heel taps L on step 2x10 - knee ext machine - 3x10 - 15#  - HS curl - 35# - 3x10 - bil  Neuromuscular re-ed: Full tandem (100%) on foam (occasional fingertip support to maintain balance) SLS on Lt x30" (occasional fingertip support)    ASSESSMENT:   CLINICAL IMPRESSION: Mercedes Parks has progressed well with therapy.  Improved impairments include: knee range of motion, pain, hip and knee strength.  Functional improvements include: walking, squatting, transfers, housework.  Progressions needed include: continued work at home with HEP.  Barriers to progress include: none.  Please see GOALS section for progress on short term and long term goals established at evaluation.  I recommend D/C home with HEP; pt agrees with plan.    OBJECTIVE IMPAIRMENTS: Pain, restricted RF length, knee strength   ACTIVITY LIMITATIONS: standing, walking, squatting, housework   PERSONAL FACTORS: See medical history and pertinent history     REHAB POTENTIAL: Good   CLINICAL DECISION MAKING: Stable/uncomplicated   EVALUATION COMPLEXITY: Low     GOALS:     SHORT TERM GOALS: Target date: 09/29/2022   Mercedes Parks will be >75% HEP compliant to improve carryover between sessions and facilitate independent management of condition   Evaluation (09/01/2022): ongoing Goal status: MET STG (HEP) Pt reports adherence 09/28/22     LONG TERM  GOALS: Target date: 10/27/2022   Mercedes Parks will show a >/= 18 pt improvement in LEFS score (MCID is 9 pts) as a proxy for functional improvement    Evaluation/Baseline (09/01/2022): 47 pts 1/23: Lower Extremity Functional Score: 80 / 80 = 100.0 % Goal status: MET     2.  Mercedes Parks will achieve 100 degrees  knee flexion in prone to improve ability to complete transfers and navigate steps   Evaluation/Baseline (09/01/2022): 70 degrees 12/18: Full Goal status: MET       3.  Mercedes Parks will improve the following MMTs to >/= 4+/5 to show improvement in strength:  knee ext and flexion    Evaluation/Baseline (09/01/2022):    LE MMT:   MMT Right 09/01/2022 Left 09/01/2022 R 12/8 L 12/8 L   Hip flexion (L2, L3) 4 3+  4 4+  Knee extension (L3) 4 3+*  4 4+  Knee flexion 4 3+**  4 4+  Hip abduction 3+ 3+ 4 3+ 4  Hip extension 4+ 4+     Hip external rotation         Hip internal rotation         Hip adduction         Ankle dorsiflexion (L4)         Ankle plantarflexion (S1)         Ankle inversion         Ankle eversion         Great Toe ext (L5)         Grossly           (Blank rows = not tested, score listed is out of 5 possible points.  N = WNL, D = diminished, C = clear for gross weakness with myotome testing, * = concordant pain with testing)   Goal status: MET     4.  Mercedes Parks will self report >/= 50% decrease in pain from evaluation    Evaluation/Baseline (09/01/2022): 9/10 max pain 12/18: MET Goal status: MET       PLAN: PT FREQUENCY: 1-2x/week   PT DURATION: 8 weeks (Ending 10/27/2022)   PLANNED INTERVENTIONS: Therapeutic exercises, Aquatic therapy, Therapeutic activity, Neuro Muscular re-education, Gait training, Patient/Family education, Joint mobilization, Dry Needling, Electrical stimulation, Spinal mobilization and/or manipulation, Moist heat, Taping, Vasopneumatic device, Ionotophoresis '4mg'$ /ml Dexamethasone, and Manual therapy   PLAN FOR NEXT SESSION: progressive knee  strengthening, RF stretching (L), possible DC next session   Mathis Dad, PT 10/19/2022, 9:58 AM

## 2022-10-22 ENCOUNTER — Encounter: Payer: Self-pay | Admitting: Physical Therapy

## 2022-10-26 ENCOUNTER — Ambulatory Visit: Payer: Medicaid Other | Admitting: Physical Therapy

## 2022-11-09 ENCOUNTER — Encounter: Payer: Self-pay | Admitting: Diagnostic Neuroimaging

## 2022-11-09 ENCOUNTER — Ambulatory Visit: Payer: Medicaid Other | Admitting: Diagnostic Neuroimaging

## 2022-11-09 VITALS — BP 107/71 | HR 79 | Ht 67.0 in | Wt 246.2 lb

## 2022-11-09 DIAGNOSIS — R4689 Other symptoms and signs involving appearance and behavior: Secondary | ICD-10-CM | POA: Diagnosis not present

## 2022-11-09 NOTE — Patient Instructions (Addendum)
-   reduce levetiracetam XR '500mg'$  to 1 tab every other day x 1-2 weeks, then stop

## 2022-11-09 NOTE — Progress Notes (Signed)
PATIENT: Mercedes Parks DOB: 03/17/1981  REASON FOR VISIT: routine follow up for seizures HISTORY FROM: patient (via ASL interpreter)  Chief Complaint  Patient presents with   Follow-up    Pt in room #7 with her interpreter (gabby). Pt here today to f/u with her Epilepsy. Pt states she has been doing well since the visit.    HISTORY OF PRESENT ILLNESS:  UPDATE (11/09/22, VRP): Since last visit, doing well. Symptoms are stable. No more seizures or spells. Now down to LEV 517m daily. Stress is better. Has been able to lose some weight.   UPDATE (05/10/2022, VRP): Since last visit, doing well.  Had video EEG admission in February to March 2023.  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  She was continued on levetiracetam 2000 g a day and recommended follow-up.  Since then she has done well.  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  He had some dj vu sensation with this event.  UPDATE (10/05/21, VRP): Here for eval of recurrent events. Had seizure like spells on 08/29/21 (shaking, eye fluttering, went to ER) and at home 09/21/21 (brief zoneout, was able to snap out of with tap on shoulder). Difficulty with sleep. Tolerating meds.   UPDATE (08/12/17, VRP): Since last visit, doing well. Tolerating meds. No alleviating or aggravating factors. No more seizures. Does note some vivid dreams (wakes up and feels like something has touched her)  UPDATE 05/10/17: Since last visit, was doing well, and had gastric bypass surgery (Oct 2017). Then on 04/25/17, was drinking etoh (~20 oz, red's applehad not done so in a while), missed evening keppra doses in July 2018. Then had multiple breakthrough seizures outside (2-3 minutes each; 4-5 sz), then came inside with help of friends, and continued to  have more seizures (another 45-60 minutes)> EMS arrived on scene. They were able to "stop" a few seizures with sternal rub. EMT raised possibility of pseudoseizures. Patient went to ER and was observed few hours and then d/c home. Records reviewed. Urine preg test neg. Labs neg. Patient was confused, tired, sore. She thinks she hit her head.   UPDATE 02/11/16: Since last visit, doing well. No seizures. Had a strange dream 2-3 weeks ago, that felt like possible seizure, then she woke up gasping for breath. Denies snoring or witnessed apnea, but lives alone. Has some daytime sleepiness even after full nights sleep.  UPDATE 02/03/15 (VRP): Since last visit, doing well. No seizures. Tolerating medications. Some more anxiety, now on cymbalta, and feels better.  UPDATE 08/05/14 (LL): Since last visit, patient has been well, no seizures. She is assisted by sign-language interpreter today. Tolerating Levitiracetam 1000 mg BID well.  Has been having some increased blurred vision and occasional diplopia, recent eye exam was normal with the exception of a change on her eyeglass Rx. She is worried about memory loss, having trouble with remembering appointments and things that she and her son have scheduled. Has lost 40 lb. In the last year.   UPDATE 01/30/14 (LL):  HChaselyncomes in for routine follow up, no seizures since last visit.  She had total hysterectomy in December due to endometriosis.  Tolerating Levitiracetam 1000 mg BID with only occasional dizziness.  She reports trouble staying asleep, wakes usually 2-3 times. Doing  very well.  Asks about "vaping" and if it may trigger seizure.  UPDATE 07/31/13: On 07/27/13 patient was at a [place] party, had several alcoholic drinks, was exposed to secondhand marijuana smoke, and had multiple back-to-back seizures. Apparently she had several minutes of convulsions followed by several minutes of remission. Seizures started and stopped multiple times. Ambulance was called and  arrived to pick her up within 45 minutes of onset of symptoms. The symptoms continued while patient was at the hospital. She was given benzodiazepine in the ambulance. She was given 1000 mg Keppra IV at the hospital. She was continued on the same dose of antiseizure medication (levetiracetam 500 mg twice a day). Patient tells that she has been missing some doses of antiseizure medication. She misses 4-8 tablets per month. This usually happens when she falls asleep after school and misses her dose.   UPDATE 11/20/12: She is doing very well, there was no recurrent seizure, she is tolerating Keppra 500 mg twice a day.    UPDATE 02/02/12: No sz. More balance troubles, weight gain, decr energy, and fatigue. Drinks lots of "energy drinks". Having trouble with stairs (trips and falls).   UPDATE 07/06/11: Doing well. No sz. Back to driving. Now with endometriosis and planning to start lupron shots, but concerned about warning on package about epilepsy.    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 He is accompanied by her mother, father and sign language interpreter.  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 Age 42 yrs - cochlear implants. Oct 2011 - dx'd with endometriosis; treated with tramadol; then had an episode of lightheadedness, out of body sensation, then passed out; reportedly had convulsions and tongue biting, no incontinence; then slept for 2 hours. Feb 2012 - exposure to synthetic marijuana, then had similar episode of seizure. March 2012 - several episodes of night time awakening, "weird breathing", not knowing if she is awake or dreaming; EEG done at Dr. 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  April 2012 - started on LEV 540m BID    REVIEW OF SYSTEMS: Full 14 system review of systems performed and negative except: as per  HPI.   ALLERGIES: Allergies  Allergen Reactions   Amoxicillin Hives   Hydrocodone Nausea And Vomiting   Nsaids     Gastric Bypass Surgery   Penicillins Hives    Has patient had a PCN reaction causing immediate rash, facial/tongue/throat swelling, SOB or lightheadedness with hypotension: Yes Has patient had a PCN reaction causing severe rash involving mucus membranes or skin necrosis: No Has patient had a PCN reaction that required hospitalization No Has patient had a PCN reaction occurring within the last 10 years: Non If all of the above answers are "NO", then may proceed with Cephalosporin use.    Sulfa Antibiotics Hives    HOME MEDICATIONS: Outpatient Medications Prior to Visit  Medication Sig Dispense Refill   escitalopram (LEXAPRO) 10 MG tablet Take 10 mg by mouth daily.     escitalopram (LEXAPRO) 5 MG tablet Take 5 mg by mouth daily. Plus 10 mg daily     estradiol (ESTRACE) 2 MG tablet Take 2 mg by mouth daily.     levETIRAcetam (KEPPRA XR) 500 MG 24 hr tablet Take 3 tablets (1,500 mg total) by mouth daily. (Patient taking differently: Take 500 mg by mouth daily.) 270 tablet 4   pantoprazole (PROTONIX) 40 MG tablet Take  40 mg by mouth 2 (two) times daily.     acetaminophen (TYLENOL) 500 MG tablet Take 500 mg by mouth every 6 (six) hours as needed for mild pain.     levocetirizine (XYZAL) 5 MG tablet Take 5 mg by mouth daily.     TRULANCE 3 MG TABS Take 1 tablet by mouth daily.     UNABLE TO FIND Med Name: Petra Kuba made ES Vit D3 5000 IU     UNABLE TO FIND Med Name: Vital fusion fiberwell gummies 1 x daily     UNABLE TO FIND Med Name:Equate MVI Adults 50+, 1 daily     No facility-administered medications prior to visit.   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 Kellie Simmering   TONSILECTOMY, ADENOIDECTOMY, BILATERAL MYRINGOTOMY AND TUBES     Social History   Socioeconomic History   Marital status: Married    Spouse name: Nicki   Number of children: 1   Years of education: College   Highest education level: Not on file  Occupational History    Comment: n/a-unemployed  Tobacco Use   Smoking status: Former    Types: Cigarettes    Quit date: 09/27/2012    Years since quitting: 10.1   Smokeless tobacco: Never  Vaping Use   Vaping Use: Never used  Substance and Sexual Activity   Alcohol use: No    Alcohol/week: 0.0 standard drinks of alcohol    Comment: quit 11/2010   Drug use: No    Comment: marijuana; quit 3/ 2012   Sexual activity: Not on file  Other Topics Concern   Not on file  Social History Narrative   Patient lives with wife.   Patient is right handed.   Patient has hs education and is currently in college.   Patient does not drink any caffeine.   Social Determinants of Health   Financial Resource Strain: Not on file  Food Insecurity: Not on file  Transportation Needs: Not on file  Physical Activity: Not on file  Stress: Not on file  Social Connections: Not on file  Intimate Partner Violence: Not on file     PHYSICAL EXAM  GENERAL EXAM/CONSTITUTIONAL: Vitals:  Vitals:   11/09/22 1454  BP: 107/71  Pulse: 79  Weight: 246 lb 3.2 oz (111.7 kg)  Height: 5' 7"$  (1.702 m)   Body mass index is 38.56 kg/m. Wt Readings from Last 6 Encounters:  11/09/22 246 lb 3.2 oz (111.7 kg)  05/10/22 239 lb 12.8 oz (108.8 kg)  10/05/21 230 lb (104.3 kg)   09/02/21 221 lb (100.2 kg)  08/29/21 196 lb 13.9 oz (89.3 kg)  12/17/20 196 lb 12.8 oz (89.3 kg)  Patient is in no distress; well developed, nourished and groomed; neck is supple  CARDIOVASCULAR: Examination of carotid arteries is normal; no carotid bruits Regular rate and rhythm, no murmurs Examination of peripheral vascular system by observation and palpation is normal  EYES: Ophthalmoscopic exam of optic discs and posterior segments is normal; no papilledema or hemorrhages No results found.  MUSCULOSKELETAL: Gait, strength, tone, movements noted in Neurologic exam below  NEUROLOGIC: MENTAL STATUS:      No data to display         awake, alert, oriented to person, place and time recent and remote memory intact normal attention and concentration language fluent, comprehension intact, naming intact fund of knowledge appropriate VIA INTERPRETER  CRANIAL NERVE:  2nd - no papilledema on fundoscopic exam 2nd, 3rd, 4th, 6th - pupils equal and reactive to light, visual fields full to confrontation, extraocular muscles intact, no nystagmus 5th - facial sensation symmetric 7th - facial strength symmetric 8th - hearing --> IMPAIRED; USES SIGN LANGUAGE 9th - palate elevates symmetrically, uvula midline 11th - shoulder shrug symmetric 12th - tongue protrusion midline  MOTOR:  normal bulk and tone, full strength in the BUE, BLE  SENSORY:  normal and symmetric to light touch  COORDINATION:  finger-nose-finger, fine finger movements normal  REFLEXES:  deep tendon reflexes present and symmetric  GAIT/STATION:  narrow based gait   DIAGNOSTICS/IMAGING:  Lab Results  Component Value Date   WBC 6.0 11/23/2021   HGB 13.9 11/23/2021   HCT 42.0 11/23/2021   MCV 90.3 11/23/2021   PLT 198 11/23/2021      Latest Ref Rng & Units 11/23/2021    1:32 PM 08/29/2021    3:47 PM 05/15/2018    2:38 PM  CMP  Glucose 70 - 99 mg/dL 102  95  87   BUN 6 - 20 mg/dL 10  16  14    $ Creatinine 0.44 - 1.00 mg/dL 0.72  0.94  0.80   Sodium 135 - 145 mmol/L 141  139  142   Potassium 3.5 - 5.1 mmol/L 3.6  4.3  4.6   Chloride 98 - 111 mmol/L 109  108  99   CO2 22 - 32 mmol/L 24  25  27   $ Calcium 8.9 - 10.3 mg/dL 8.1  8.3  9.3   Total Protein 6.5 - 8.1 g/dL 5.8  5.7  6.8   Total Bilirubin 0.3 - 1.2 mg/dL 0.4  0.8  0.3   Alkaline Phos 38 - 126 U/L 69  75  89   AST 15 - 41 U/L 26  35  19   ALT 0 - 44 U/L 23  21  15     $ 08/09/14 CT head - nothing acute  05/19/17 CT head  - Unremarkable. No acute findings.   05/18/17 EEG  - normal  09/09/21 EEG - normal  09/23/21 LEV 5.8 L (10-40)   ASSESSMENT: 42 y.o. with multiple episodes of loss of consciousness, convulsions, tongue biting, also with out of body experience and aura prior to the spells. 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   Last seizure 07/27/13 and then 04/25/17 (in setting of ETOH and missed medication doses). Then 2021 and 2022 seizures / spells (in setting of stress, missed meds, lack of sleep).    Dx:  1. Spell of abnormal behavior       PLAN:    NON-EPILEPTIC SPELLS (seizure d/o less likely; last event possibly  04/28/22; triggers include stress, sleep deprivation)  - reduce levetiracetam XR 537m to 1 tab every other day x 1-2 weeks, then stop  - may consider lamotrigine in future if sxs continue, which could also help with mood stabilization  Return for pending if symptoms worsen or fail to improve.    VPenni Bombard MD [id number] Certified in Neurology, Neurophysiology and Neuroimaging  [facility] [address] SLake ArrowheadGRosedale [place] [phone]

## 2022-11-11 ENCOUNTER — Ambulatory Visit (INDEPENDENT_AMBULATORY_CARE_PROVIDER_SITE_OTHER): Payer: Medicaid Other | Admitting: Orthopaedic Surgery

## 2022-11-11 ENCOUNTER — Encounter: Payer: Self-pay | Admitting: Orthopaedic Surgery

## 2022-11-11 DIAGNOSIS — G8929 Other chronic pain: Secondary | ICD-10-CM

## 2022-11-11 DIAGNOSIS — M25562 Pain in left knee: Secondary | ICD-10-CM

## 2022-11-11 MED ORDER — BUPIVACAINE HCL 0.5 % IJ SOLN
2.0000 mL | INTRAMUSCULAR | Status: AC | PRN
  Administered 2022-11-11: 2 mL via INTRA_ARTICULAR

## 2022-11-11 MED ORDER — METHYLPREDNISOLONE ACETATE 40 MG/ML IJ SUSP
40.0000 mg | INTRAMUSCULAR | Status: AC | PRN
  Administered 2022-11-11: 40 mg via INTRA_ARTICULAR

## 2022-11-11 MED ORDER — LIDOCAINE HCL 1 % IJ SOLN
2.0000 mL | INTRAMUSCULAR | Status: AC | PRN
  Administered 2022-11-11: 2 mL

## 2022-11-11 NOTE — Progress Notes (Signed)
Office Visit Note   Patient: Mercedes Parks           Date of Birth: 1981/06/28           MRN: [id number] Visit Date: 11/11/2022              Requested by: Denny Levy, [place],  [place] [address] PCP: Denny Levy, [place]   Assessment & Plan: Visit Diagnoses:  1. Chronic pain of left knee     Plan: Impression is recurrent left knee pain and effusion.  Patient would like to undergo aspiration cortisone injection.  Not ready for knee surgery as she is scheduled to have revision gastric bypass surgery.  20 cc aspirated and steroid injected.  Patient tolerated well.  Follow-up as needed.  Sign language interpreter present today.  Follow-Up Instructions: No follow-ups on file.   Orders:  No orders of the defined types were placed in this encounter.  No orders of the defined types were placed in this encounter.     Procedures: Large Joint Inj: L knee on 11/11/2022 8:52 AM Details: 22 G needle Medications: 2 mL bupivacaine 0.5 %; 2 mL lidocaine 1 %; 40 mg methylPREDNISolone acetate 40 MG/ML Aspirate: 20 mL Outcome: tolerated well, no immediate complications Patient was prepped and draped in the usual sterile fashion.       Clinical Data: No additional findings.   Subjective: Chief Complaint  Patient presents with   Left Knee - Pain    HPI  Mercedes Parks is here for recurrent left knee pain and swelling.  Sign language interpreter present today.  Review of Systems  Constitutional: Negative.   HENT: Negative.    Eyes: Negative.   Respiratory: Negative.    Cardiovascular: Negative.   Endocrine: Negative.   Musculoskeletal: Negative.   Neurological: Negative.   Hematological: Negative.   Psychiatric/Behavioral: Negative.    All other systems reviewed and are negative.    Objective: Vital Signs: There were no vitals taken for this visit.  Physical Exam Vitals and nursing note reviewed.  Constitutional:      Appearance: She is well-developed.   HENT:     Head: Normocephalic and atraumatic.  Pulmonary:     Effort: Pulmonary effort is normal.  Abdominal:     Palpations: Abdomen is soft.  Musculoskeletal:     Cervical back: Neck supple.  Skin:    General: Skin is warm.     Capillary Refill: Capillary refill takes less than 2 seconds.  Neurological:     Mental Status: She is alert and oriented to person, place, and time.  Psychiatric:        Behavior: Behavior normal.        Thought Content: Thought content normal.        Judgment: Judgment normal.     Ortho Exam  Examination of the left knee shows small joint effusion.  No signs of infection.  Pain with range of motion.  Specialty Comments:  No specialty comments available.  Imaging: No results found.   PMFS History: Patient Active Problem List   Diagnosis Date Noted   Seizure ([facility]) 11/23/2021   Gastroesophageal reflux disease 12/17/2020   Food intolerance 12/17/2020   Panniculitis 09/30/2020   Insect bite 06/26/2018   Urticaria 01/24/2018   Seasonal and perennial allergic rhinitis 01/24/2018   Allergic conjunctivitis 01/24/2018   Coughing 01/24/2018   Fatigue 04/05/2016   Hypersomnia with sleep apnea 04/05/2016   Snoring 04/05/2016   Hearing loss sensory,  bilateral 04/05/2016   Constipation 07/14/2015   Abdominal pain 07/14/2015   Generalized idiopathic epilepsy, not intractable, without status epilepticus ([facility]) 02/03/2015   Past Medical History:  Diagnosis Date   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 Kellie Simmering   TONSILECTOMY, ADENOIDECTOMY, BILATERAL MYRINGOTOMY AND TUBES     Social History   Occupational History    Comment: n/a-unemployed  Tobacco Use   Smoking status: Former    Types: Cigarettes    Quit date: 09/27/2012    Years since quitting: 10.1   Smokeless tobacco: Never  Vaping Use   Vaping Use: Never used  Substance and Sexual Activity   Alcohol use: No    Alcohol/week: 0.0 standard drinks of alcohol    Comment: quit 11/2010   Drug use: No    Comment: marijuana; quit 3/ 2012   Sexual activity: Not on file

## 2023-02-25 ENCOUNTER — Ambulatory Visit (INDEPENDENT_AMBULATORY_CARE_PROVIDER_SITE_OTHER): Payer: Medicaid Other | Admitting: Orthopaedic Surgery

## 2023-02-25 DIAGNOSIS — M228X2 Other disorders of patella, left knee: Secondary | ICD-10-CM | POA: Diagnosis not present

## 2023-02-25 DIAGNOSIS — S83242A Other tear of medial meniscus, current injury, left knee, initial encounter: Secondary | ICD-10-CM | POA: Diagnosis not present

## 2023-02-25 NOTE — Progress Notes (Signed)
Office Visit Note   Patient: Mercedes Parks           Date of Birth: 29-Jan-1981           MRN: [id number] Visit Date: 02/25/2023              Requested by: Lawerance Sabal, [place] [address] [place],  [place] [address] PCP: Lawerance Sabal, [place]   Assessment & Plan: Visit Diagnoses:  1. Acute medial meniscus tear of left knee, initial encounter   2. Maltracking of left patella     Plan: Impression is 42 year old female with medial meniscal tear and patella maltracking with increased TT-TG distance.  Details of the surgery including risk benefits prognosis reviewed.  Patient would like to schedule surgery for sometime in August.  Sign language interpreter present during the entire encounter today.  Follow-Up Instructions: No follow-ups on file.   Orders:  No orders of the defined types were placed in this encounter.  No orders of the defined types were placed in this encounter.     Procedures: No procedures performed   Clinical Data: No additional findings.   Subjective: Chief Complaint  Patient presents with   Left Knee - Pain    HPI Mercedes Parks returns today for follow-up on chronic left knee pain.  She had an MRI that showed a medial meniscal tear and patella maltracking.  Sign language interpreter present today. Review of Systems   Objective: Vital Signs: There were no vitals taken for this visit.  Physical Exam  Ortho Exam Examination left knee shows medial joint line tenderness and increased Q angle with lateral patellar tracking. Specialty Comments:  No specialty comments available.  Imaging: No results found.   PMFS History: Patient Active Problem List   Diagnosis Date Noted   Maltracking of left patella 02/25/2023   Acute medial meniscus tear of left knee 02/25/2023   Seizure ([facility]) 11/23/2021   Gastroesophageal reflux disease 12/17/2020   Food intolerance 12/17/2020   Panniculitis 09/30/2020   Insect bite 06/26/2018   Urticaria 01/24/2018    Seasonal and perennial allergic rhinitis 01/24/2018   Allergic conjunctivitis 01/24/2018   Coughing 01/24/2018   Fatigue 04/05/2016   Hypersomnia with sleep apnea 04/05/2016   Snoring 04/05/2016   Hearing loss sensory, bilateral 04/05/2016   Constipation 07/14/2015   Abdominal pain 07/14/2015   Generalized idiopathic epilepsy, not intractable, without status epilepticus ([facility]) 02/03/2015   Past Medical History:  Diagnosis Date   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 Hart [place]   TONSILECTOMY, ADENOIDECTOMY, BILATERAL MYRINGOTOMY AND TUBES     Social History   Occupational History    Comment: n/a-unemployed  Tobacco Use   Smoking status: Former    Types:  Cigarettes    Quit date: 09/27/2012    Years since quitting: 10.4   Smokeless tobacco: Never  Vaping Use   Vaping Use: Never used  Substance and Sexual Activity   Alcohol use: No    Alcohol/week: 0.0 standard drinks of alcohol    Comment: quit 11/2010   Drug use: No    Comment: marijuana; quit 3/ 2012   Sexual activity: Not on file

## 2023-03-10 ENCOUNTER — Telehealth: Payer: Self-pay | Admitting: Orthopaedic Surgery

## 2023-03-10 NOTE — Telephone Encounter (Signed)
Message left using relay interpreter.  Asked patient to call back when possible to discuss surgery date options with Dr. Roda Shutters.  Name and my direct number provided for patient to call back.

## 2023-03-29 ENCOUNTER — Encounter: Payer: Self-pay | Admitting: Orthopaedic Surgery

## 2023-03-29 NOTE — Telephone Encounter (Signed)
Do you know what is she talking about?

## 2023-04-22 ENCOUNTER — Other Ambulatory Visit: Payer: Self-pay

## 2023-04-22 ENCOUNTER — Encounter (HOSPITAL_BASED_OUTPATIENT_CLINIC_OR_DEPARTMENT_OTHER): Payer: Self-pay | Admitting: Orthopaedic Surgery

## 2023-04-28 ENCOUNTER — Other Ambulatory Visit: Payer: Self-pay | Admitting: Physician Assistant

## 2023-04-28 MED ORDER — ACETAMINOPHEN-CODEINE 300-30 MG PO TABS: 1.0000 | ORAL_TABLET | Freq: Three times a day (TID) | ORAL | 1 refills | Status: AC | PRN

## 2023-04-28 MED ORDER — ONDANSETRON HCL 4 MG PO TABS: 4.0000 mg | ORAL_TABLET | Freq: Three times a day (TID) | ORAL | 0 refills | Status: AC | PRN

## 2023-05-02 ENCOUNTER — Other Ambulatory Visit: Payer: Self-pay | Admitting: Physician Assistant

## 2023-05-04 ENCOUNTER — Encounter (HOSPITAL_BASED_OUTPATIENT_CLINIC_OR_DEPARTMENT_OTHER): Payer: Self-pay | Admitting: Orthopaedic Surgery

## 2023-05-04 ENCOUNTER — Ambulatory Visit (HOSPITAL_BASED_OUTPATIENT_CLINIC_OR_DEPARTMENT_OTHER)
Admission: RE | Admit: 2023-05-04 | Discharge: 2023-05-04 | Disposition: A | Discharge status: Home / Self Care | Payer: MEDICAID | Attending: Orthopaedic Surgery | Admitting: Orthopaedic Surgery

## 2023-05-04 ENCOUNTER — Encounter (HOSPITAL_BASED_OUTPATIENT_CLINIC_OR_DEPARTMENT_OTHER)
Admission: RE | Disposition: A | Discharge status: Home / Self Care | Payer: Self-pay | Source: Home / Self Care | Attending: Orthopaedic Surgery

## 2023-05-04 ENCOUNTER — Ambulatory Visit (HOSPITAL_BASED_OUTPATIENT_CLINIC_OR_DEPARTMENT_OTHER): Payer: MEDICAID | Admitting: Anesthesiology

## 2023-05-04 ENCOUNTER — Encounter: Payer: Self-pay | Admitting: Orthopaedic Surgery

## 2023-05-04 DIAGNOSIS — M94262 Chondromalacia, left knee: Secondary | ICD-10-CM | POA: Insufficient documentation

## 2023-05-04 DIAGNOSIS — S83242A Other tear of medial meniscus, current injury, left knee, initial encounter: Secondary | ICD-10-CM

## 2023-05-04 DIAGNOSIS — Z87891 Personal history of nicotine dependence: Secondary | ICD-10-CM | POA: Diagnosis not present

## 2023-05-04 DIAGNOSIS — Z6841 Body Mass Index (BMI) 40.0 and over, adult: Secondary | ICD-10-CM | POA: Diagnosis not present

## 2023-05-04 DIAGNOSIS — M228X2 Other disorders of patella, left knee: Secondary | ICD-10-CM

## 2023-05-04 DIAGNOSIS — K219 Gastro-esophageal reflux disease without esophagitis: Secondary | ICD-10-CM | POA: Diagnosis not present

## 2023-05-04 DIAGNOSIS — Z01818 Encounter for other preprocedural examination: Secondary | ICD-10-CM

## 2023-05-04 DIAGNOSIS — S83242D Other tear of medial meniscus, current injury, left knee, subsequent encounter: Secondary | ICD-10-CM | POA: Diagnosis not present

## 2023-05-04 DIAGNOSIS — G40909 Epilepsy, unspecified, not intractable, without status epilepticus: Secondary | ICD-10-CM | POA: Insufficient documentation

## 2023-05-04 DIAGNOSIS — Z6839 Body mass index (BMI) 39.0-39.9, adult: Secondary | ICD-10-CM | POA: Diagnosis not present

## 2023-05-04 DIAGNOSIS — X58XXXA Exposure to other specified factors, initial encounter: Secondary | ICD-10-CM | POA: Insufficient documentation

## 2023-05-04 DIAGNOSIS — M6588 Other synovitis and tenosynovitis, other site: Secondary | ICD-10-CM | POA: Insufficient documentation

## 2023-05-04 HISTORY — DX: Gastro-esophageal reflux disease without esophagitis: K21.9

## 2023-05-04 HISTORY — PX: KNEE ARTHROSCOPY WITH LATERAL RELEASE: SHX5649

## 2023-05-04 HISTORY — DX: Other complications of anesthesia, initial encounter: T88.59XA

## 2023-05-04 HISTORY — DX: Epilepsy, unspecified, not intractable, without status epilepticus: G40.909

## 2023-05-04 HISTORY — PX: KNEE ARTHROSCOPY WITH MEDIAL MENISECTOMY: SHX5651

## 2023-05-04 HISTORY — DX: Other specified postprocedural states: Z98.890

## 2023-05-04 SURGERY — ARTHROSCOPY, KNEE, WITH MEDIAL MENISCECTOMY
Anesthesia: General | Site: Knee | Laterality: Left

## 2023-05-04 MED ORDER — LIDOCAINE 2% (20 MG/ML) 5 ML SYRINGE
INTRAMUSCULAR | Status: AC
  Filled 2023-05-04: qty 5

## 2023-05-04 MED ORDER — PHENYLEPHRINE 80 MCG/ML (10ML) SYRINGE FOR IV PUSH (FOR BLOOD PRESSURE SUPPORT)
PREFILLED_SYRINGE | INTRAVENOUS | Status: DC | PRN
  Administered 2023-05-04: 160 ug via INTRAVENOUS

## 2023-05-04 MED ORDER — SCOPOLAMINE 1 MG/3DAYS TD PT72
MEDICATED_PATCH | TRANSDERMAL | Status: AC
  Filled 2023-05-04: qty 1

## 2023-05-04 MED ORDER — PROPOFOL 500 MG/50ML IV EMUL
INTRAVENOUS | Status: AC
  Filled 2023-05-04: qty 50

## 2023-05-04 MED ORDER — CEFAZOLIN SODIUM-DEXTROSE 2-4 GM/100ML-% IV SOLN: 2.0000 g | INTRAVENOUS | Status: DC

## 2023-05-04 MED ORDER — MIDAZOLAM HCL 2 MG/2ML IJ SOLN
INTRAMUSCULAR | Status: DC | PRN
  Administered 2023-05-04: 2 mg via INTRAVENOUS

## 2023-05-04 MED ORDER — FENTANYL CITRATE (PF) 100 MCG/2ML IJ SOLN
INTRAMUSCULAR | Status: AC
  Filled 2023-05-04: qty 2

## 2023-05-04 MED ORDER — SCOPOLAMINE 1 MG/3DAYS TD PT72
1.0000 | MEDICATED_PATCH | TRANSDERMAL | Status: DC
  Administered 2023-05-04: 1.5 mg via TRANSDERMAL

## 2023-05-04 MED ORDER — FENTANYL CITRATE (PF) 100 MCG/2ML IJ SOLN
INTRAMUSCULAR | Status: DC | PRN
  Administered 2023-05-04 (×4): 50 ug via INTRAVENOUS

## 2023-05-04 MED ORDER — ONDANSETRON HCL 4 MG/2ML IJ SOLN
INTRAMUSCULAR | Status: DC | PRN
  Administered 2023-05-04: 4 mg via INTRAVENOUS

## 2023-05-04 MED ORDER — SODIUM CHLORIDE 0.9 % IR SOLN
Status: DC | PRN
  Administered 2023-05-04: 6000 mL

## 2023-05-04 MED ORDER — BUPIVACAINE HCL (PF) 0.25 % IJ SOLN
INTRAMUSCULAR | Status: DC | PRN
  Administered 2023-05-04: 20 mL

## 2023-05-04 MED ORDER — PROPOFOL 10 MG/ML IV BOLUS
INTRAVENOUS | Status: DC | PRN
  Administered 2023-05-04: 200 ug via INTRAVENOUS

## 2023-05-04 MED ORDER — ONDANSETRON HCL 4 MG/2ML IJ SOLN
INTRAMUSCULAR | Status: AC
  Filled 2023-05-04: qty 2

## 2023-05-04 MED ORDER — CEFAZOLIN SODIUM-DEXTROSE 2-3 GM-%(50ML) IV SOLR
INTRAVENOUS | Status: DC | PRN
  Administered 2023-05-04: 2 g via INTRAVENOUS

## 2023-05-04 MED ORDER — LIDOCAINE 2% (20 MG/ML) 5 ML SYRINGE
INTRAMUSCULAR | Status: DC | PRN
  Administered 2023-05-04: 100 mg via INTRAVENOUS

## 2023-05-04 MED ORDER — CEFAZOLIN SODIUM-DEXTROSE 2-4 GM/100ML-% IV SOLN
INTRAVENOUS | Status: AC
  Filled 2023-05-04: qty 100

## 2023-05-04 MED ORDER — DEXAMETHASONE SODIUM PHOSPHATE 10 MG/ML IJ SOLN
INTRAMUSCULAR | Status: AC
  Filled 2023-05-04: qty 1

## 2023-05-04 MED ORDER — DEXMEDETOMIDINE HCL IN NACL 80 MCG/20ML IV SOLN
INTRAVENOUS | Status: AC
  Filled 2023-05-04: qty 20

## 2023-05-04 MED ORDER — LACTATED RINGERS IV SOLN: INTRAVENOUS | Status: DC

## 2023-05-04 MED ORDER — MIDAZOLAM HCL 2 MG/2ML IJ SOLN
INTRAMUSCULAR | Status: AC
  Filled 2023-05-04: qty 2

## 2023-05-04 MED ORDER — ACETAMINOPHEN 500 MG PO TABS
1000.0000 mg | ORAL_TABLET | Freq: Once | ORAL | Status: AC
  Administered 2023-05-04: 1000 mg via ORAL

## 2023-05-04 MED ORDER — PHENYLEPHRINE 80 MCG/ML (10ML) SYRINGE FOR IV PUSH (FOR BLOOD PRESSURE SUPPORT)
PREFILLED_SYRINGE | INTRAVENOUS | Status: AC
  Filled 2023-05-04: qty 10

## 2023-05-04 MED ORDER — DEXAMETHASONE SODIUM PHOSPHATE 10 MG/ML IJ SOLN
INTRAMUSCULAR | Status: DC | PRN
  Administered 2023-05-04: 5 mg via INTRAVENOUS

## 2023-05-04 MED ORDER — FENTANYL CITRATE (PF) 100 MCG/2ML IJ SOLN
25.0000 ug | INTRAMUSCULAR | Status: DC | PRN
  Administered 2023-05-04 (×3): 50 ug via INTRAVENOUS

## 2023-05-04 MED ORDER — ACETAMINOPHEN 500 MG PO TABS
ORAL_TABLET | ORAL | Status: AC
  Filled 2023-05-04: qty 2

## 2023-05-04 SURGICAL SUPPLY — 49 items
BANDAGE ESMARK 6X9 LF (GAUZE/BANDAGES/DRESSINGS) IMPLANT
BLADE EXCALIBUR 4.0X13 (MISCELLANEOUS) IMPLANT
BLADE SHAVER TORPEDO 4X13 (MISCELLANEOUS) ×1 IMPLANT
BNDG CMPR 5X4 KNIT ELC UNQ LF (GAUZE/BANDAGES/DRESSINGS)
BNDG CMPR 6 X 5 YARDS HK CLSR (GAUZE/BANDAGES/DRESSINGS) ×2
BNDG CMPR 9X6 STRL LF SNTH (GAUZE/BANDAGES/DRESSINGS)
BNDG ELASTIC 4INX 5YD STR LF (GAUZE/BANDAGES/DRESSINGS) IMPLANT
BNDG ELASTIC 6INX 5YD STR LF (GAUZE/BANDAGES/DRESSINGS) ×2 IMPLANT
BNDG ESMARK 6X9 LF (GAUZE/BANDAGES/DRESSINGS)
BUR SURG 4D 13L RD FLUTE (BUR) IMPLANT
BURR OVAL 8 FLU 4.0X13 (MISCELLANEOUS) IMPLANT
BURR SURG 4D 13L RD FLUTE (BUR) ×1
COOLER ICEMAN CLASSIC (MISCELLANEOUS) ×1 IMPLANT
CUFF TOURN SGL QUICK 34 (TOURNIQUET CUFF) ×1
CUFF TRNQT CYL 34X4.125X (TOURNIQUET CUFF) ×1 IMPLANT
CUTTER BONE 4.0MM X 13CM (MISCELLANEOUS) IMPLANT
DISSECTOR 3.5MM X 13CM CVD (MISCELLANEOUS) IMPLANT
DRAPE ARTHROSCOPY W/POUCH 90 (DRAPES) ×1 IMPLANT
DRAPE IMP U-DRAPE 54X76 (DRAPES) ×1 IMPLANT
DRAPE U-SHAPE 47X51 STRL (DRAPES) ×1 IMPLANT
DURAPREP 26ML APPLICATOR (WOUND CARE) ×2 IMPLANT
ELECT MENISCUS 165MM 90D (ELECTRODE) IMPLANT
ELECT REM PT RETURN 9FT ADLT (ELECTROSURGICAL) ×1
ELECTRODE REM PT RTRN 9FT ADLT (ELECTROSURGICAL) ×1 IMPLANT
EXCALIBUR 3.8MM X 13CM (MISCELLANEOUS) IMPLANT
GAUZE SPONGE 4X4 12PLY STRL (GAUZE/BANDAGES/DRESSINGS) ×1 IMPLANT
GAUZE XEROFORM 1X8 LF (GAUZE/BANDAGES/DRESSINGS) ×1 IMPLANT
GLOVE BIOGEL PI IND STRL 7.5 (GLOVE) ×1 IMPLANT
GLOVE ECLIPSE 7.0 STRL STRAW (GLOVE) ×2 IMPLANT
GLOVE INDICATOR 7.0 STRL GRN (GLOVE) ×2 IMPLANT
GLOVE SURG SYN 7.5 E (GLOVE) ×2 IMPLANT
GLOVE SURG SYN 7.5 PF PI (GLOVE) ×2 IMPLANT
GOWN STRL REUS W/ TWL LRG LVL3 (GOWN DISPOSABLE) ×1 IMPLANT
GOWN STRL REUS W/ TWL XL LVL3 (GOWN DISPOSABLE) ×1 IMPLANT
GOWN STRL REUS W/TWL LRG LVL3 (GOWN DISPOSABLE) ×1
GOWN STRL REUS W/TWL XL LVL3 (GOWN DISPOSABLE) ×1
GOWN STRL SURGICAL XL XLNG (GOWN DISPOSABLE) ×2 IMPLANT
MANIFOLD NEPTUNE II (INSTRUMENTS) ×1 IMPLANT
PACK ARTHROSCOPY DSU (CUSTOM PROCEDURE TRAY) ×1 IMPLANT
PACK BASIN DAY SURGERY FS (CUSTOM PROCEDURE TRAY) ×1 IMPLANT
PAD COLD SHLDR WRAP-ON (PAD) ×1 IMPLANT
PENCIL SMOKE EVACUATOR (MISCELLANEOUS) ×1 IMPLANT
RESECTOR TORPEDO 4MM 13CM CVD (MISCELLANEOUS) IMPLANT
SHEET MEDIUM DRAPE 40X70 STRL (DRAPES) ×1 IMPLANT
SLEEVE SCD COMPRESS KNEE MED (STOCKING) ×1 IMPLANT
SUT ETHILON 3 0 PS 1 (SUTURE) ×1 IMPLANT
TOWEL GREEN STERILE FF (TOWEL DISPOSABLE) ×1 IMPLANT
TUBING ARTHROSCOPY IRRIG 16FT (MISCELLANEOUS) ×1 IMPLANT
WATER STERILE IRR 1000ML POUR (IV SOLUTION) ×1 IMPLANT

## 2023-05-04 NOTE — Op Note (Signed)
   Surgery Date: 05/04/2023  PREOPERATIVE DIAGNOSES:  1. Left knee medial meniscus tear 2. 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 Left knee arthroscopy with limited synovectomy 2. Left knee arthroscopy with arthroscopic partial medial meniscectomy 3. Left knee arthroscopy with arthroscopic abrasion arthroplasty medial femoral condyle 4. Left knee arthroscopy with lateral release  SURGEON: N. Glee Arvin, M.D.  ASSIST: Starlyn Skeans [place], [place]; necessary for the timely completion of procedure and due to complexity of procedure.  ANESTHESIA:  general  FLUIDS: Per anesthesia record.   ESTIMATED BLOOD LOSS: minimal  DESCRIPTION OF PROCEDURE: [name] is a 42 y.o.-year-old female with above mentioned conditions. Full discussion held regarding risks benefits alternatives and complications related surgical intervention. Conservative care options reviewed. All questions answered.  The patient was identified in the preoperative holding area and the operative extremity was marked. The patient was brought to the operating room and transferred to operating table in a supine position. Satisfactory general anesthesia was induced by anesthesiology.    Standard anterolateral, anteromedial arthroscopy portals were obtained. The anteromedial portal was obtained with a spinal needle for localization under direct visualization with subsequent diagnostic findings.   Medial compartment was first addressed.  Valgus force was placed on the knee against the lateral post.  The posterior weightbearing surface of the medial femoral condyle exhibited grade 3-4 changes with unstable cartilage.  Transitions were smooth with with an oscillating shaver.  High-speed bur was used to perform abrasion arthroplasty to the area of grade IV chondromalacia.  A probe was then used to identify the  radial tear of the medial meniscus in the mid body.  A portion of the displaced flap was dislodged into the inferior medial gutter.  This was retrieved out of the gutter and then resected back to stable margins with an oscillating shaver and meniscus basket.  The cruciates were examined and unremarkable.  The lateral compartment was unremarkable with some degenerative fraying of the lateral meniscus.  The knee was placed into extension.  There was grade III chondromalacia of the posterior patellar surface and the femoral trochlea.  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  There was no sulcus.  The patella was laterally everted and tracking.  Lateral release was then performed with an underwater Bovie from the superior pole of the patella to the inferior pole.  Gutters were checked for loose bodies.  Excess fluid was removed from the knee joint.  Incisions were closed with interrupted nylon sutures.  Sterile dressings were applied.  Patient tolerated procedure well had no immediate complications.  Suprapatellar pouch and gutters: moderate synovitis or debris. Patella chondral surface: Grade 2 Trochlear chondral surface: Grade 3 Patellofemoral tracking: lateral tracking, trochlear dysplasia Medial meniscus: radial tear with inferiorly displaced flap into medial gutter.  Medial femoral condyle weight bearing surface: Grade 4 Medial tibial plateau: Grade 1 Anterior cruciate ligament:stable Posterior cruciate ligament:stable Lateral meniscus: normal.   Lateral femoral condyle weight bearing surface: Grade 0 Lateral tibial plateau: Grade 0  DISPOSITION: The patient was awakened from general anesthetic, extubated, taken to the recovery room in medically stable condition, no apparent complications. The patient may be weightbearing as tolerated to the operative lower extremity.  Range of motion of right knee as tolerated.  Mayra Reel, MD [facility] 1:09 PM

## 2023-05-04 NOTE — Anesthesia Procedure Notes (Signed)
Procedure Name: LMA Insertion Date/Time: 05/04/2023 12:22 PM  Performed by: Alvera Novel, CRNAPre-anesthesia Checklist: Patient identified, Emergency Drugs available, Suction available and Patient being monitored Patient Re-evaluated:Patient Re-evaluated prior to induction Oxygen Delivery Method: Circle System Utilized Preoxygenation: Pre-oxygenation with 100% oxygen Induction Type: IV induction Ventilation: Mask ventilation without difficulty LMA: LMA inserted LMA Size: 4.0 Number of attempts: 1 Airway Equipment and Method: Bite block Placement Confirmation: positive ETCO2 Tube secured with: Tape Dental Injury: Teeth and Oropharynx as per pre-operative assessment

## 2023-05-04 NOTE — Transfer of Care (Signed)
Immediate Anesthesia Transfer of Care Note  Patient: MARGIE PHILBRICK  Procedure(s) Performed: KNEE ARTHROSCOPY WITH MEDIAL MENISECTOMY (Left: Knee) LATERAL RELEASE (Left: Knee)  Patient Location: PACU  Anesthesia Type:General  Level of Consciousness: drowsy and patient cooperative  Airway & Oxygen Therapy: Patient Spontanous Breathing and Patient connected to face mask oxygen  Post-op Assessment: Report given to RN and Post -op Vital signs reviewed and stable  Post vital signs: Reviewed and stable  Last Vitals:  Vitals Value Taken Time  BP 116/90 05/04/23 1323  Temp    Pulse 73 05/04/23 1328  Resp 12 05/04/23 1328  SpO2 99 % 05/04/23 1328  Vitals shown include unfiled device data.  Last Pain:  Vitals:   05/04/23 1027  TempSrc: Temporal  PainSc: 0-No pain      Patients Stated Pain Goal: 3 (05/04/23 1027)  Complications: No notable events documented.

## 2023-05-04 NOTE — H&P (Signed)
PREOPERATIVE H&P  Chief Complaint: left knee medial meniscal tear, patellar maltracking  HPI: Mercedes Parks is a 42 y.o. female who presents for surgical treatment of left knee medial meniscal tear, patellar maltracking.  She denies any changes in medical history.  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 Hart Rochester   TONSILECTOMY, ADENOIDECTOMY, BILATERAL MYRINGOTOMY AND TUBES     Social History   Socioeconomic History   Marital status: Significant Other    Spouse name: Nicki   Number of children: 1   Years of education: College   Highest education level: Not on file  Occupational History    Comment: n/a-unemployed  Tobacco Use   Smoking status: Former    Current packs/day: 0.00    Types: Cigarettes    Quit date: 09/27/2012    Years since quitting: 10.6   Smokeless tobacco: Never  Vaping Use   Vaping status: Never Used  Substance and Sexual Activity   Alcohol use: No    Alcohol/week: 0.0 standard drinks of alcohol    Comment: quit 11/2010   Drug use: No    Comment: marijuana; quit 3/ 2012   Sexual activity: Not on file  Other Topics Concern   Not on file  Social History Narrative   Patient lives with wife.   Patient is right handed.   Patient has hs education and is currently in college.   Patient does not drink any caffeine.   Social Determinants of Health   Financial Resource Strain: Not on file  Food Insecurity: Food Insecurity Present (11/19/2020)    Received from [facility], [facility]   Hunger Vital Sign    Worried About Running Out of Food in the Last Year: Never true    Ran Out of Food in the Last Year: Sometimes true  Transportation Needs: Not on file  Physical Activity: Not on file  Stress: Not on file  Social Connections: Unknown (01/27/2022)   Received from [facility], [facility]   Social Network    Social Network: Not on file   Family History  Problem Relation Age of Onset   Healthy Mother    Healthy Father    Colon cancer Maternal Grandfather    Breast cancer Paternal Grandmother    Other Paternal Grandmother        Cherlynn Polo disease   Seizures Neg Hx    Allergies  Allergen Reactions   Amoxicillin Hives   Hydrocodone Nausea And Vomiting   Nsaids     Gastric Bypass Surgery   Penicillins Hives    Has patient had a PCN reaction causing immediate rash, facial/tongue/throat swelling, SOB or lightheadedness with hypotension: Yes Has patient had a PCN reaction causing severe rash involving mucus membranes or skin necrosis: No Has patient had a PCN reaction that required hospitalization No Has  patient had a PCN reaction occurring within the last 10 years: Non If all of the above answers are "NO", then may proceed with Cephalosporin use.    Sulfa Antibiotics Hives   Prior to Admission medications   Medication Sig Start Date End Date Taking? Authorizing Provider  escitalopram (LEXAPRO) 5 MG tablet Take 5 mg by mouth daily. Plus 10 mg daily 04/13/22  Yes [provider]  estradiol (ESTRACE) 2 MG tablet Take 2 mg by mouth daily.   Yes [provider]  Multiple Vitamin (MULTIVITAMIN ADULT PO) Take by mouth daily.   Yes [provider]  pantoprazole (PROTONIX) 40 MG tablet Take 40 mg by mouth 2 (two) times daily. 04/07/20  Yes [provider]  acetaminophen-codeine (TYLENOL #3) 300-30 MG tablet Take 1-2 tablets by mouth every 8 (eight) hours as needed for moderate pain. To be  taken after surgery 04/28/23   Cristie Hem, PA-C  escitalopram (LEXAPRO) 10 MG tablet Take 10 mg by mouth daily. 03/23/20   [provider]  levETIRAcetam (KEPPRA XR) 500 MG 24 hr tablet Take 3 tablets (1,500 mg total) by mouth daily. Patient taking differently: Take 500 mg by mouth daily. 05/10/22 08/03/23  Penumalli, Glenford Bayley, MD  ondansetron (ZOFRAN) 4 MG tablet Take 1 tablet (4 mg total) by mouth every 8 (eight) hours as needed for nausea or vomiting. 04/28/23   Cristie Hem, PA-C     Positive ROS: All other systems have been reviewed and were otherwise negative with the exception of those mentioned in the HPI and as above.  Physical Exam: General: Alert, no acute distress Cardiovascular: No pedal edema Respiratory: No cyanosis, no use of accessory musculature GI: abdomen soft Skin: No lesions in the area of chief complaint Neurologic: Sensation intact distally Psychiatric: Patient is competent for consent with normal mood and affect Lymphatic: no lymphedema  MUSCULOSKELETAL: exam stable  Assessment: left knee medial meniscal tear, patellar maltracking  Plan: Plan for Procedure(s): KNEE ARTHROSCOPY WITH MEDIAL MENISECTOMY LATERAL RELEASE  The risks benefits and alternatives were discussed with the patient including but not limited to the risks of nonoperative treatment, versus surgical intervention including infection, bleeding, nerve injury,  blood clots, cardiopulmonary complications, morbidity, mortality, among others, and they were willing to proceed.   Glee Arvin, MD 05/04/2023 10:05 AM

## 2023-05-04 NOTE — Anesthesia Preprocedure Evaluation (Addendum)
Anesthesia Evaluation  Patient identified by MRN, date of birth, ID band Patient awake    Reviewed: Allergy & Precautions, NPO status , Patient's Chart, lab work & pertinent test results  History of Anesthesia Complications (+) PONV and history of anesthetic complications  Airway Mallampati: I  TM Distance: >3 FB Neck ROM: Full    Dental no notable dental hx. (+) Teeth Intact, Dental Advisory Given   Pulmonary former smoker   Pulmonary exam normal breath sounds clear to auscultation       Cardiovascular negative cardio ROS Normal cardiovascular exam Rhythm:Regular Rate:Normal     Neuro/Psych Seizures - (last seizure 04/2022, not on anti-epileptics), Well Controlled,  Hearing loss  negative psych ROS   GI/Hepatic Neg liver ROS,GERD  ,,  Endo/Other    Morbid obesity (BMI 40)  Renal/GU negative Renal ROS  negative genitourinary   Musculoskeletal negative musculoskeletal ROS (+)    Abdominal   Peds  Hematology negative hematology ROS (+)   Anesthesia Other Findings   Reproductive/Obstetrics                             Anesthesia Physical Anesthesia Plan  ASA: 3  Anesthesia Plan: General   Post-op Pain Management: Tylenol PO (pre-op)*   Induction: Intravenous  PONV Risk Score and Plan: 4 or greater and Ondansetron, Dexamethasone, Midazolam and Scopolamine patch - Pre-op  Airway Management Planned: LMA  Additional Equipment:   Intra-op Plan:   Post-operative Plan: Extubation in OR  Informed Consent: I have reviewed the patients History and Physical, chart, labs and discussed the procedure including the risks, benefits and alternatives for the proposed anesthesia with the patient or authorized representative who has indicated his/her understanding and acceptance.     Dental advisory given  Plan Discussed with: CRNA  Anesthesia Plan Comments:        Anesthesia Quick  Evaluation

## 2023-05-04 NOTE — Discharge Instructions (Addendum)

## 2023-05-05 ENCOUNTER — Encounter (HOSPITAL_BASED_OUTPATIENT_CLINIC_OR_DEPARTMENT_OTHER): Payer: Self-pay | Admitting: Orthopaedic Surgery

## 2023-05-05 ENCOUNTER — Telehealth: Payer: Self-pay | Admitting: Orthopaedic Surgery

## 2023-05-05 ENCOUNTER — Encounter: Payer: Self-pay | Admitting: Orthopaedic Surgery

## 2023-05-05 NOTE — Telephone Encounter (Signed)
Codeine is different than what is in hydrocodone.  Can she not take hydrocodone?

## 2023-05-05 NOTE — Telephone Encounter (Signed)
Patient called. Says she can not take codeine. Would like something else sent in for her pain. Her cb# [phone]

## 2023-05-05 NOTE — Telephone Encounter (Signed)
No answer

## 2023-05-05 NOTE — Telephone Encounter (Signed)
Communicating via MyChart.

## 2023-05-05 NOTE — Anesthesia Postprocedure Evaluation (Signed)
Anesthesia Post Note  Patient: Mercedes Parks  Procedure(s) Performed: KNEE ARTHROSCOPY WITH MEDIAL MENISECTOMY (Left: Knee) LATERAL RELEASE (Left: Knee)     Patient location during evaluation: PACU Anesthesia Type: General Level of consciousness: awake and alert Pain management: pain level controlled Vital Signs Assessment: post-procedure vital signs reviewed and stable Respiratory status: spontaneous breathing, nonlabored ventilation, respiratory function stable and patient connected to nasal cannula oxygen Cardiovascular status: blood pressure returned to baseline and stable Postop Assessment: no apparent nausea or vomiting Anesthetic complications: no  No notable events documented.  Last Vitals:  Vitals:   05/04/23 1415 05/04/23 1451  BP: 108/72 118/71  Pulse: 94 78  Resp: (!) 24 16  Temp:  36.6 C  SpO2: 99% 95%    Last Pain:  Vitals:   05/04/23 1451  TempSrc: Oral  PainSc: 3                   L 

## 2023-05-06 ENCOUNTER — Other Ambulatory Visit: Payer: Self-pay | Admitting: Physician Assistant

## 2023-05-06 MED ORDER — HYDROMORPHONE HCL 2 MG PO TABS
ORAL_TABLET | ORAL | 0 refills | Status: AC
Start: 2023-05-06 — End: ?

## 2023-05-06 NOTE — Telephone Encounter (Signed)
If she cannot do codones or codeine and has a history of seizures (meaning should not use tramadol), we are limited to dilaudid.  I have sent in a small rx.  It is a very strong pain med

## 2023-05-11 ENCOUNTER — Ambulatory Visit (INDEPENDENT_AMBULATORY_CARE_PROVIDER_SITE_OTHER): Payer: MEDICAID | Admitting: Physician Assistant

## 2023-05-11 ENCOUNTER — Encounter: Payer: Self-pay | Admitting: Physician Assistant

## 2023-05-11 DIAGNOSIS — Z9889 Other specified postprocedural states: Secondary | ICD-10-CM

## 2023-05-11 NOTE — Progress Notes (Addendum)
Post-Op Visit Note   Patient: Mercedes Parks           Date of Birth: [birth year]           MRN: [id number] Visit Date: 05/11/2023 PCP: Lawerance Sabal, PA   Assessment & Plan:  Chief Complaint:  Chief Complaint  Patient presents with   Left Knee - Follow-up    Left knee scope 05/04/2023   Visit Diagnoses:  1. S/P left knee arthroscopy     Plan: Patient is a pleasant 42 year old female who is here today with his son language interpreter.  She is 1 week status post left knee arthroscopic debridement medial meniscus, synovectomy, chondroplasty and lateral release 05/04/2023.  It was noted during operative intervention and that she had grade 4 changes to the medial compartment in addition to grade 3 changes of the trochlear surface.  She has been doing relatively well.  Currently not taking anything for pain.  Examination of the left knee reveals fully healed surgical portals without complication.  Calf soft nontender.  She is neurovascularly intact distally.  Today, sutures were removed and Steri-Strips applied.  Intraoperative pictures reviewed.  We have attempted to apply a PSO brace, but unfortunately it does not fit.  We will provide her with a prescription to pick 1 of these up at a medical supply store or she will have the option of getting one of her [employer] or elsewhere.  We will also start her in outpatient physical therapy and referrals been made.  Home exercise program also provided.  Follow-up with [place] in 5 weeks.  Call with concerns or questions.  Follow-Up Instructions: Return in about 5 weeks (around 06/15/2023).   Orders:  Orders Placed This Encounter  Procedures   Ambulatory referral to Physical Therapy   No orders of the defined types were placed in this encounter.   Imaging: No new imaging  PMFS History: Patient Active Problem List   Diagnosis Date Noted   Chondromalacia of left knee 05/04/2023   Maltracking of left patella 02/25/2023   Acute medial meniscal  tear, left, initial encounter 02/25/2023   Seizure (HCC) 11/23/2021   Gastroesophageal reflux disease 12/17/2020   Food intolerance 12/17/2020   Panniculitis 09/30/2020   Insect bite 06/26/2018   Urticaria 01/24/2018   Seasonal and perennial allergic rhinitis 01/24/2018   Allergic conjunctivitis 01/24/2018   Coughing 01/24/2018   Fatigue 04/05/2016   Hypersomnia with sleep apnea 04/05/2016   Snoring 04/05/2016   Hearing loss sensory, bilateral 04/05/2016   Constipation 07/14/2015   Abdominal pain 07/14/2015   Generalized idiopathic epilepsy, not intractable, without status epilepticus (HCC) 02/03/2015   Past Medical History:  Diagnosis Date   Complication of anesthesia    Epilepsy (HCC)    last seizure 04/2022   GERD (gastroesophageal reflux disease)    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   PONV (postoperative nausea and vomiting)    Seizure (HCC)    04/28/22 PNEW- non epilepic    Family History  Problem Relation Age of Onset   Healthy Mother    Healthy Father    Colon cancer Maternal Grandfather    Breast cancer Paternal Grandmother    Other Paternal Grandmother        Cherlynn Polo disease   Seizures Neg Hx     Past Surgical History:  Procedure Laterality Date   ABDOMINAL HYSTERECTOMY  2011   CESAREAN SECTION     @ age 64   CHOLECYSTECTOMY  COCHLEAR IMPLANT     @ age 41   COCHLEAR IMPLANT  12/2020   removed 01/08/22   GASTRIC BYPASS  07/05/2016   KNEE ARTHROSCOPY WITH LATERAL RELEASE Left 05/04/2023   Procedure: LATERAL RELEASE;  Surgeon: Tarry Kos, MD;  Location: [facility];  Service: Orthopedics;  Laterality: Left;   KNEE ARTHROSCOPY WITH MEDIAL MENISECTOMY Left 05/04/2023   Procedure: KNEE ARTHROSCOPY WITH MEDIAL MENISECTOMY;  Surgeon: Tarry Kos, MD;  Location: [facility];  Service: Orthopedics;  Laterality: Left;   PANNICULECTOMY  10/12/2017   Dr. Hart Rochester   TONSILECTOMY, ADENOIDECTOMY, BILATERAL MYRINGOTOMY AND TUBES      Social History   Occupational History    Comment: n/a-unemployed  Tobacco Use   Smoking status: Former    Current packs/day: 0.00    Types: Cigarettes    Quit date: 09/27/2012    Years since quitting: 10.6   Smokeless tobacco: Never  Vaping Use   Vaping status: Never Used  Substance and Sexual Activity   Alcohol use: No    Alcohol/week: 0.0 standard drinks of alcohol    Comment: quit 11/2010   Drug use: No    Comment: marijuana; quit 3/ 2012   Sexual activity: Not on file
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
## 2023-05-26 ENCOUNTER — Other Ambulatory Visit: Payer: Self-pay

## 2023-05-26 ENCOUNTER — Ambulatory Visit: Payer: MEDICAID | Attending: Physician Assistant | Admitting: Physical Therapy

## 2023-05-26 ENCOUNTER — Encounter: Payer: Self-pay | Admitting: Physical Therapy

## 2023-05-26 DIAGNOSIS — R262 Difficulty in walking, not elsewhere classified: Secondary | ICD-10-CM | POA: Insufficient documentation

## 2023-05-26 DIAGNOSIS — R600 Localized edema: Secondary | ICD-10-CM | POA: Diagnosis present

## 2023-05-26 DIAGNOSIS — Z9889 Other specified postprocedural states: Secondary | ICD-10-CM | POA: Diagnosis not present

## 2023-05-26 DIAGNOSIS — M6281 Muscle weakness (generalized): Secondary | ICD-10-CM | POA: Diagnosis present

## 2023-05-26 DIAGNOSIS — M25562 Pain in left knee: Secondary | ICD-10-CM | POA: Insufficient documentation

## 2023-06-06 ENCOUNTER — Encounter: Payer: Self-pay | Admitting: Physical Therapy

## 2023-06-06 ENCOUNTER — Ambulatory Visit: Payer: MEDICAID | Attending: Physician Assistant | Admitting: Physical Therapy

## 2023-06-06 DIAGNOSIS — R262 Difficulty in walking, not elsewhere classified: Secondary | ICD-10-CM | POA: Diagnosis present

## 2023-06-06 DIAGNOSIS — M6281 Muscle weakness (generalized): Secondary | ICD-10-CM | POA: Diagnosis present

## 2023-06-06 DIAGNOSIS — R600 Localized edema: Secondary | ICD-10-CM | POA: Insufficient documentation

## 2023-06-06 DIAGNOSIS — M25562 Pain in left knee: Secondary | ICD-10-CM | POA: Insufficient documentation
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
## 2023-06-09 ENCOUNTER — Ambulatory Visit: Payer: MEDICAID | Admitting: Physical Therapy

## 2023-06-09 ENCOUNTER — Encounter: Payer: Self-pay | Admitting: Physical Therapy

## 2023-06-09 DIAGNOSIS — M25562 Pain in left knee: Secondary | ICD-10-CM

## 2023-06-09 DIAGNOSIS — R600 Localized edema: Secondary | ICD-10-CM

## 2023-06-09 DIAGNOSIS — M6281 Muscle weakness (generalized): Secondary | ICD-10-CM

## 2023-06-09 DIAGNOSIS — R262 Difficulty in walking, not elsewhere classified: Secondary | ICD-10-CM
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
## 2023-06-13 ENCOUNTER — Encounter: Payer: Self-pay | Admitting: Physical Therapy

## 2023-06-13 ENCOUNTER — Ambulatory Visit: Payer: MEDICAID | Admitting: Physical Therapy

## 2023-06-13 DIAGNOSIS — R600 Localized edema: Secondary | ICD-10-CM

## 2023-06-13 DIAGNOSIS — M25562 Pain in left knee: Secondary | ICD-10-CM

## 2023-06-13 DIAGNOSIS — M6281 Muscle weakness (generalized): Secondary | ICD-10-CM

## 2023-06-13 DIAGNOSIS — R262 Difficulty in walking, not elsewhere classified: Secondary | ICD-10-CM

## 2023-06-15 ENCOUNTER — Ambulatory Visit (INDEPENDENT_AMBULATORY_CARE_PROVIDER_SITE_OTHER): Payer: MEDICAID | Admitting: Orthopaedic Surgery

## 2023-06-15 DIAGNOSIS — Z9889 Other specified postprocedural states: Secondary | ICD-10-CM

## 2023-06-15 DIAGNOSIS — M228X2 Other disorders of patella, left knee: Secondary | ICD-10-CM

## 2023-06-15 DIAGNOSIS — S83242A Other tear of medial meniscus, current injury, left knee, initial encounter: Secondary | ICD-10-CM

## 2023-06-15 NOTE — Progress Notes (Signed)
Post-Op Visit Note   Patient: Mercedes Parks           Date of Birth: 24-Apr-1981           MRN: [id number] Visit Date: 06/15/2023 PCP: Lawerance Sabal, PA   Assessment & Plan:  Chief Complaint:  Chief Complaint  Patient presents with   Left Knee - Routine Post Op   Visit Diagnoses:  1. S/P left knee arthroscopy   2. Maltracking of left patella   3. Acute medial meniscus tear of left knee, initial encounter     Plan: Mercedes Parks is 6 weeks status post left knee scope.  She is doing great has no complaints.  She has rehabbed really well.  Examination of left knee shows fully healed surgical scars.  Excellent range of motion and quad function.  At this point Mercedes Parks has done an excellent job rehabbing.  I would like her to wear the knee brace during activity for another 6 weeks but otherwise she can increase activity as tolerated.  Follow-up as needed.  Follow-Up Instructions: No follow-ups on file.   Orders:  No orders of the defined types were placed in this encounter.  No orders of the defined types were placed in this encounter.   Imaging: No results found.  PMFS History: Patient Active Problem List   Diagnosis Date Noted   Chondromalacia of left knee 05/04/2023   Maltracking of left patella 02/25/2023   Acute medial meniscal tear, left, initial encounter 02/25/2023   Seizure (HCC) 11/23/2021   Gastroesophageal reflux disease 12/17/2020   Food intolerance 12/17/2020   Panniculitis 09/30/2020   Insect bite 06/26/2018   Urticaria 01/24/2018   Seasonal and perennial allergic rhinitis 01/24/2018   Allergic conjunctivitis 01/24/2018   Coughing 01/24/2018   Fatigue 04/05/2016   Hypersomnia with sleep apnea 04/05/2016   Snoring 04/05/2016   Hearing loss sensory, bilateral 04/05/2016   Constipation 07/14/2015   Abdominal pain 07/14/2015   Generalized idiopathic epilepsy, not intractable, without status epilepticus (HCC) 02/03/2015   Past Medical History:   Diagnosis Date   Complication of anesthesia    Epilepsy (HCC)    last seizure 04/2022   GERD (gastroesophageal reflux disease)    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   PONV (postoperative nausea and vomiting)    Seizure (HCC)    04/28/22 PNEW- non epilepic    Family History  Problem Relation Age of Onset   Healthy Mother    Healthy Father    Colon cancer Maternal Grandfather    Breast cancer Paternal Grandmother    Other Paternal Grandmother        Mercedes Parks disease   Seizures Neg Hx     Past Surgical History:  Procedure Laterality Date   ABDOMINAL HYSTERECTOMY  2011   CESAREAN SECTION     @ age 37   CHOLECYSTECTOMY     COCHLEAR IMPLANT     @ age 14   COCHLEAR IMPLANT  12/2020   removed 01/08/22   GASTRIC BYPASS  07/05/2016   KNEE ARTHROSCOPY WITH LATERAL RELEASE Left 05/04/2023   Procedure: LATERAL RELEASE;  Surgeon: Tarry Kos, MD;  Location: [facility];  Service: Orthopedics;  Laterality: Left;   KNEE ARTHROSCOPY WITH MEDIAL MENISECTOMY Left 05/04/2023   Procedure: KNEE ARTHROSCOPY WITH MEDIAL MENISECTOMY;  Surgeon: Tarry Kos, MD;  Location: [facility];  Service: Orthopedics;  Laterality: Left;   PANNICULECTOMY  10/12/2017   Dr. Hart Rochester  TONSILECTOMY, ADENOIDECTOMY, BILATERAL MYRINGOTOMY AND TUBES     Social History   Occupational History    Comment: n/a-unemployed  Tobacco Use   Smoking status: Former    Current packs/day: 0.00    Types: Cigarettes    Quit date: 09/27/2012    Years since quitting: 10.7   Smokeless tobacco: Never  Vaping Use   Vaping status: Never Used  Substance and Sexual Activity   Alcohol use: No    Alcohol/week: 0.0 standard drinks of alcohol    Comment: quit 11/2010   Drug use: No    Comment: marijuana; quit 3/ 2012   Sexual activity: Not on file

## 2023-06-16 ENCOUNTER — Encounter: Payer: Self-pay | Admitting: Physical Therapy

## 2023-06-16 ENCOUNTER — Ambulatory Visit: Payer: MEDICAID | Admitting: Physical Therapy

## 2023-06-16 DIAGNOSIS — M25562 Pain in left knee: Secondary | ICD-10-CM

## 2023-06-16 DIAGNOSIS — M6281 Muscle weakness (generalized): Secondary | ICD-10-CM
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
## 2023-06-20 ENCOUNTER — Ambulatory Visit: Payer: MEDICAID | Admitting: Physical Therapy

## 2023-06-23 ENCOUNTER — Ambulatory Visit: Payer: MEDICAID | Admitting: Physical Therapy

## 2023-06-27 ENCOUNTER — Ambulatory Visit: Payer: MEDICAID | Admitting: Physical Therapy

## 2023-06-30 ENCOUNTER — Encounter: Payer: MEDICAID | Admitting: Physical Therapy

## 2023-07-04 ENCOUNTER — Encounter: Payer: MEDICAID | Admitting: Physical Therapy

## 2023-07-07 ENCOUNTER — Encounter: Payer: MEDICAID | Admitting: Physical Therapy

## 2023-07-11 ENCOUNTER — Encounter: Payer: MEDICAID | Admitting: Physical Therapy

## 2023-07-14 ENCOUNTER — Encounter: Payer: MEDICAID | Admitting: Physical Therapy

## 2023-07-18 ENCOUNTER — Encounter: Payer: MEDICAID | Admitting: Physical Therapy

## 2023-07-21 ENCOUNTER — Encounter: Payer: MEDICAID | Admitting: Physical Therapy

## 2023-09-15 DIAGNOSIS — Z0289 Encounter for other administrative examinations: Secondary | ICD-10-CM

## 2023-12-23 ENCOUNTER — Ambulatory Visit: Payer: MEDICAID | Admitting: Physician Assistant

## 2023-12-23 DIAGNOSIS — G8929 Other chronic pain: Secondary | ICD-10-CM

## 2023-12-23 DIAGNOSIS — M25552 Pain in left hip: Secondary | ICD-10-CM | POA: Diagnosis not present

## 2023-12-23 DIAGNOSIS — M545 Low back pain, unspecified: Secondary | ICD-10-CM

## 2023-12-23 NOTE — Progress Notes (Signed)
 Office Visit Note   Patient: Mercedes Parks           Date of Birth: [birth year]           MRN: [id number] Visit Date: 12/23/2023              Requested by: Lawerance Sabal, [place] [address] [place],  [place] [address] PCP: Lawerance Sabal, [place]   Assessment & Plan: Visit Diagnoses:  1. Pain in left hip   2. Chronic low back pain, unspecified back pain laterality, unspecified whether sciatica present     Plan: Impression is chronic low back pain with new onset left hip pain.  I believe the patient does have symptoms consistent with both lumbar and hip pathology.  We have discussed referral to Dr. Shon Baton for hip injection however she is currently asymptomatic.  Of also discussed referral to PT for her back.  She tells me she works 5 days a week and does not have time for this.  We have provided her with a spine conditioning program.  If her hip symptoms become more consistent or worsen, she will let us know and we will make a referral for left hip injection with Dr. Shon Baton.  Otherwise, follow-up as needed.  The entire encounter was discussed through an ASL interpreter.  Follow-Up Instructions: Return if symptoms worsen or fail to improve.   Orders:  No orders of the defined types were placed in this encounter.  No orders of the defined types were placed in this encounter.     Procedures: No procedures performed   Clinical Data: No additional findings.   Subjective: Chief Complaint  Patient presents with   Left Hip - Pain    HPI patient is a pleasant 43 year old hearing-impaired female here today with an ASL interpreter.  She is here with pain to the left hip and back.  She has chronic back pain but has noticed increased pain to the left hip specifically the groin over the past month.  She denies any injury or change in activity.  Symptoms are intermittent but appear to be worse with activity.  She has recently been given gabapentin by her primary care provider which does seem to  help.  She notes occasional tingling to the left lateral hip.  No bowel or bladder change or saddle paresthesias.  No previous left hip injection.  She is recently status post gastric bypass surgery and is unsure whether she can have steroids at this time.  Review of Systems as detailed in HPI.  All others reviewed and are negative.   Objective: Vital Signs: There were no vitals taken for this visit.  Physical Exam well-developed well-nourished female in no acute distress.  Alert and oriented x 3.  Ortho Exam left hip exam: She has pain with FADIR and Stinchfield testing.  No pain with logroll.  No focal weakness.  She is neurovascularly intact distally.  Specialty Comments:  No specialty comments available.  Imaging: X-rays of the lumbar spine and left hip reviewed by me on external disc brought in by the patient show moderate degenerative changes L5-S1 as well as moderate degenerative changes to the left hip joint.   PMFS History: Patient Active Problem List   Diagnosis Date Noted   Chondromalacia of left knee 05/04/2023   Maltracking of left patella 02/25/2023   Acute medial meniscal tear, left, initial encounter 02/25/2023   Seizure (HCC) 11/23/2021   Gastroesophageal reflux disease 12/17/2020   Food intolerance 12/17/2020  Panniculitis 09/30/2020   Insect bite 06/26/2018   Urticaria 01/24/2018   Seasonal and perennial allergic rhinitis 01/24/2018   Allergic conjunctivitis 01/24/2018   Coughing 01/24/2018   Fatigue 04/05/2016   Hypersomnia with sleep apnea 04/05/2016   Snoring 04/05/2016   Hearing loss sensory, bilateral 04/05/2016   Constipation 07/14/2015   Abdominal pain 07/14/2015   Generalized idiopathic epilepsy, not intractable, without status epilepticus (HCC) 02/03/2015   Past Medical History:  Diagnosis Date   Complication of anesthesia    Epilepsy (HCC)    last seizure 04/2022   GERD (gastroesophageal reflux disease)    Hearing loss    due to H. flu  meningitis   PONV (postoperative nausea and vomiting)    Seizure (HCC)    04/28/22 PNEW- non epilepic    Family History  Problem Relation Age of Onset   Healthy Mother    Healthy Father    Colon cancer Maternal Grandfather    Breast cancer Paternal Grandmother    Other Paternal Grandmother        Cherlynn Polo disease   Seizures Neg Hx     Past Surgical History:  Procedure Laterality Date   ABDOMINAL HYSTERECTOMY  2011   CESAREAN SECTION     @ age 35   CHOLECYSTECTOMY     COCHLEAR IMPLANT     @ age 56   COCHLEAR IMPLANT  12/2020   removed 01/08/22   GASTRIC BYPASS  07/05/2016   KNEE ARTHROSCOPY WITH LATERAL RELEASE Left 05/04/2023   Procedure: LATERAL RELEASE;  Surgeon: Tarry Kos, MD;  Location: [facility];  Service: Orthopedics;  Laterality: Left;   KNEE ARTHROSCOPY WITH MEDIAL MENISECTOMY Left 05/04/2023   Procedure: KNEE ARTHROSCOPY WITH MEDIAL MENISECTOMY;  Surgeon: Tarry Kos, MD;  Location: [facility];  Service: Orthopedics;  Laterality: Left;   PANNICULECTOMY  10/12/2017   Dr. Hart Rochester   TONSILECTOMY, ADENOIDECTOMY, BILATERAL MYRINGOTOMY AND TUBES     Social History   Occupational History    Comment: n/a-unemployed  Tobacco Use   Smoking status: Former    Current packs/day: 0.00    Types: Cigarettes    Quit date: 09/27/2012    Years since quitting: 11.2   Smokeless tobacco: Never  Vaping Use   Vaping status: Never Used  Substance and Sexual Activity   Alcohol use: No    Alcohol/week: 0.0 standard drinks of alcohol    Comment: quit 11/2010   Drug use: No    Comment: marijuana; quit 3/ 2012   Sexual activity: Not on file

## 2024-07-30 ENCOUNTER — Encounter: Payer: Self-pay | Admitting: Radiology
